# Patient Record
Sex: Male | Born: 1935 | Race: Asian | Hispanic: No | Marital: Married | Smoking: Never smoker
Health system: Southern US, Community
[De-identification: ages and names within clinical notes are randomized; demographics above are authoritative.]

## PROBLEM LIST (undated history)

## (undated) DIAGNOSIS — N201 Calculus of ureter: Secondary | ICD-10-CM

## (undated) DIAGNOSIS — I10 Essential (primary) hypertension: Secondary | ICD-10-CM

## (undated) DIAGNOSIS — N189 Chronic kidney disease, unspecified: Secondary | ICD-10-CM

## (undated) DIAGNOSIS — E785 Hyperlipidemia, unspecified: Secondary | ICD-10-CM

## (undated) HISTORY — PX: CATARACT EXTRACTION W/ INTRAOCULAR LENS  IMPLANT, BILATERAL: SHX1307

---

## 2002-07-31 ENCOUNTER — Emergency Department (HOSPITAL_COMMUNITY): Admission: EM | Admit: 2002-07-31 | Discharge: 2002-07-31 | Payer: Self-pay | Admitting: Emergency Medicine

## 2002-12-13 ENCOUNTER — Emergency Department (HOSPITAL_COMMUNITY): Admission: EM | Admit: 2002-12-13 | Discharge: 2002-12-13 | Payer: Self-pay | Admitting: Emergency Medicine

## 2002-12-19 ENCOUNTER — Emergency Department (HOSPITAL_COMMUNITY): Admission: EM | Admit: 2002-12-19 | Discharge: 2002-12-19 | Payer: Self-pay | Admitting: Emergency Medicine

## 2011-01-02 ENCOUNTER — Ambulatory Visit (HOSPITAL_BASED_OUTPATIENT_CLINIC_OR_DEPARTMENT_OTHER)
Admission: RE | Admit: 2011-01-02 | Discharge: 2011-01-02 | Disposition: A | Discharge status: Home / Self Care | Payer: Medicare Other | Source: Ambulatory Visit | Attending: Urology | Admitting: Urology

## 2011-01-02 ENCOUNTER — Ambulatory Visit (HOSPITAL_COMMUNITY): Payer: PRIVATE HEALTH INSURANCE | Attending: Urology

## 2011-01-02 DIAGNOSIS — N1330 Unspecified hydronephrosis: Secondary | ICD-10-CM | POA: Insufficient documentation

## 2011-01-02 DIAGNOSIS — N201 Calculus of ureter: Secondary | ICD-10-CM | POA: Insufficient documentation

## 2011-01-02 DIAGNOSIS — I10 Essential (primary) hypertension: Secondary | ICD-10-CM | POA: Insufficient documentation

## 2011-01-02 HISTORY — PX: CYSTOSCOPY W/ URETERAL STENT PLACEMENT: SHX1429

## 2011-01-03 LAB — POCT I-STAT 4, (NA,K, GLUC, HGB,HCT)
Glucose, Bld: 120 mg/dL — ABNORMAL HIGH (ref 70–99)
HCT: 42 % (ref 39.0–52.0)

## 2011-01-04 ENCOUNTER — Ambulatory Visit (HOSPITAL_COMMUNITY)
Admission: RE | Admit: 2011-01-04 | Discharge: 2011-01-04 | Disposition: A | Discharge status: Home / Self Care | Payer: Medicare Other | Source: Ambulatory Visit | Attending: Urology | Admitting: Urology

## 2011-01-04 DIAGNOSIS — N200 Calculus of kidney: Secondary | ICD-10-CM | POA: Insufficient documentation

## 2011-01-04 DIAGNOSIS — I10 Essential (primary) hypertension: Secondary | ICD-10-CM | POA: Insufficient documentation

## 2011-01-04 DIAGNOSIS — N201 Calculus of ureter: Secondary | ICD-10-CM | POA: Insufficient documentation

## 2011-01-04 LAB — POCT I-STAT 4, (NA,K, GLUC, HGB,HCT)
HCT: 44 % (ref 39.0–52.0)
Hemoglobin: 15 g/dL (ref 13.0–17.0)
Potassium: 7.2 mEq/L (ref 3.5–5.1)
Sodium: 137 mEq/L (ref 135–145)
Sodium: 138 mEq/L (ref 135–145)

## 2011-01-10 NOTE — Op Note (Signed)
  NAMEMILBURN, FREENEY             ACCOUNT NO.:  [id number]  MEDICAL RECORD NO.:  [id number]           PATIENT TYPE:  O  LOCATION:  DAY                          FACILITY:  [facility]  PHYSICIAN:  Courtney Paris, M.D.DATE OF BIRTH:  1935/04/12  DATE OF PROCEDURE:  01/02/2011 DATE OF DISCHARGE:                              OPERATIVE REPORT   PREOPERATIVE DIAGNOSIS:  Obstructing right mid ureteral calculus, 9 x 18 mm.  POSTOPERATIVE DIAGNOSIS:  Obstructing right mid ureteral calculus, 9 x 18 mm.  OPERATION:  Cystoscopy, right retrograde pyelogram, right placement of ureteral stent.  ANESTHESIA:  General.  SURGEON:  Courtney Paris, MD  BRIEF HISTORY:  This 76 year old [place] gentleman presented with some hematuria for workup, was found to have a 9 x 18 mm right mid ureteral stone with moderate hydronephrosis.  We do not know how long he has had the stent as he has not had pain.  He has not had previous stones before.  He is admitted now for cystoscopy and placement of a right ureteral stent prior to attempted lithotripsy of this stone later this week.  The patient was placed on the operating table in dorsal lithotomy position.  After satisfactory induction of general anesthesia, he was prepped and draped with Betadine in the usual sterile fashion and given IV Cipro.  Time-out was then performed and the patient and procedure then reconfirmed.  Cystoscopy was done, anterior urethra was normal, no significant prostate enlargement was noted, and the bladder was entered. Both orifices looked normal.  The bladder itself was free of any mucosal lesions.  The right orifice was catheterized with a 5 open-ended ureteral catheter.  It was quite tortuous and up to the level of the stone and there was moderate hydronephrosis proximal to the stone. Through the 5 open-ended ureteral catheter under fluoroscopy, I passed a glidewire under direct vision and then passed the stone.  I  had difficulty getting the open-ended ureteral catheter go past the stone. I left the glidewire in place and passed a 6-French x 20 cm length double-J ureteral stent up to the level of stone and with gentle manipulation, I was finally able to get the stent passed this into the kidney.  The glidewire was then removed and a nice coil in the renal bladder and 1 in the renal pelvis was noted.  The bladder was drained, given B and O suppository, he was taken to recovery room in good condition.  He has planned to have his lithotripsy in 2 days.     Courtney Paris, M.D.     HMK/MEDQ  D:  01/02/2011  T:  01/02/2011  Job:  [id number]  Electronically Signed by Vic Blackbird M.D. on 01/10/2011 07:58:03 AM

## 2011-02-05 ENCOUNTER — Ambulatory Visit (HOSPITAL_COMMUNITY)
Admission: RE | Admit: 2011-02-05 | Discharge: 2011-02-05 | Disposition: A | Discharge status: Home / Self Care | Payer: Medicare Other | Source: Ambulatory Visit | Attending: Urology | Admitting: Urology

## 2011-02-05 ENCOUNTER — Ambulatory Visit (HOSPITAL_COMMUNITY): Payer: Medicare Other

## 2011-02-05 DIAGNOSIS — I10 Essential (primary) hypertension: Secondary | ICD-10-CM | POA: Insufficient documentation

## 2011-02-05 DIAGNOSIS — N201 Calculus of ureter: Secondary | ICD-10-CM | POA: Insufficient documentation

## 2011-02-05 HISTORY — PX: EXTRACORPOREAL SHOCK WAVE LITHOTRIPSY: SHX1557

## 2011-02-05 LAB — BASIC METABOLIC PANEL
Calcium: 9.2 mg/dL (ref 8.4–10.5)
Chloride: 104 mEq/L (ref 96–112)
Creatinine, Ser: 0.83 mg/dL (ref 0.4–1.5)
GFR calc Af Amer: >60 mL/min (ref >60)
GFR calc non Af Amer: >60 mL/min (ref >60)

## 2011-02-05 LAB — CBC
MCH: 31.2 pg (ref 26.0–34.0)
Platelets: 188 10*3/uL (ref 150–400)
RBC: 4.49 MIL/uL (ref 4.22–5.81)

## 2011-07-31 IMAGING — CR DG ABDOMEN 1V
1 series · 1 of 1 positions shown · non-contrast
Comparison: KUB 01/04/2011.

CLINICAL DATA: Pre lithotripsy examination.

ABDOMEN - 1 VIEW

[t abdomen supine]
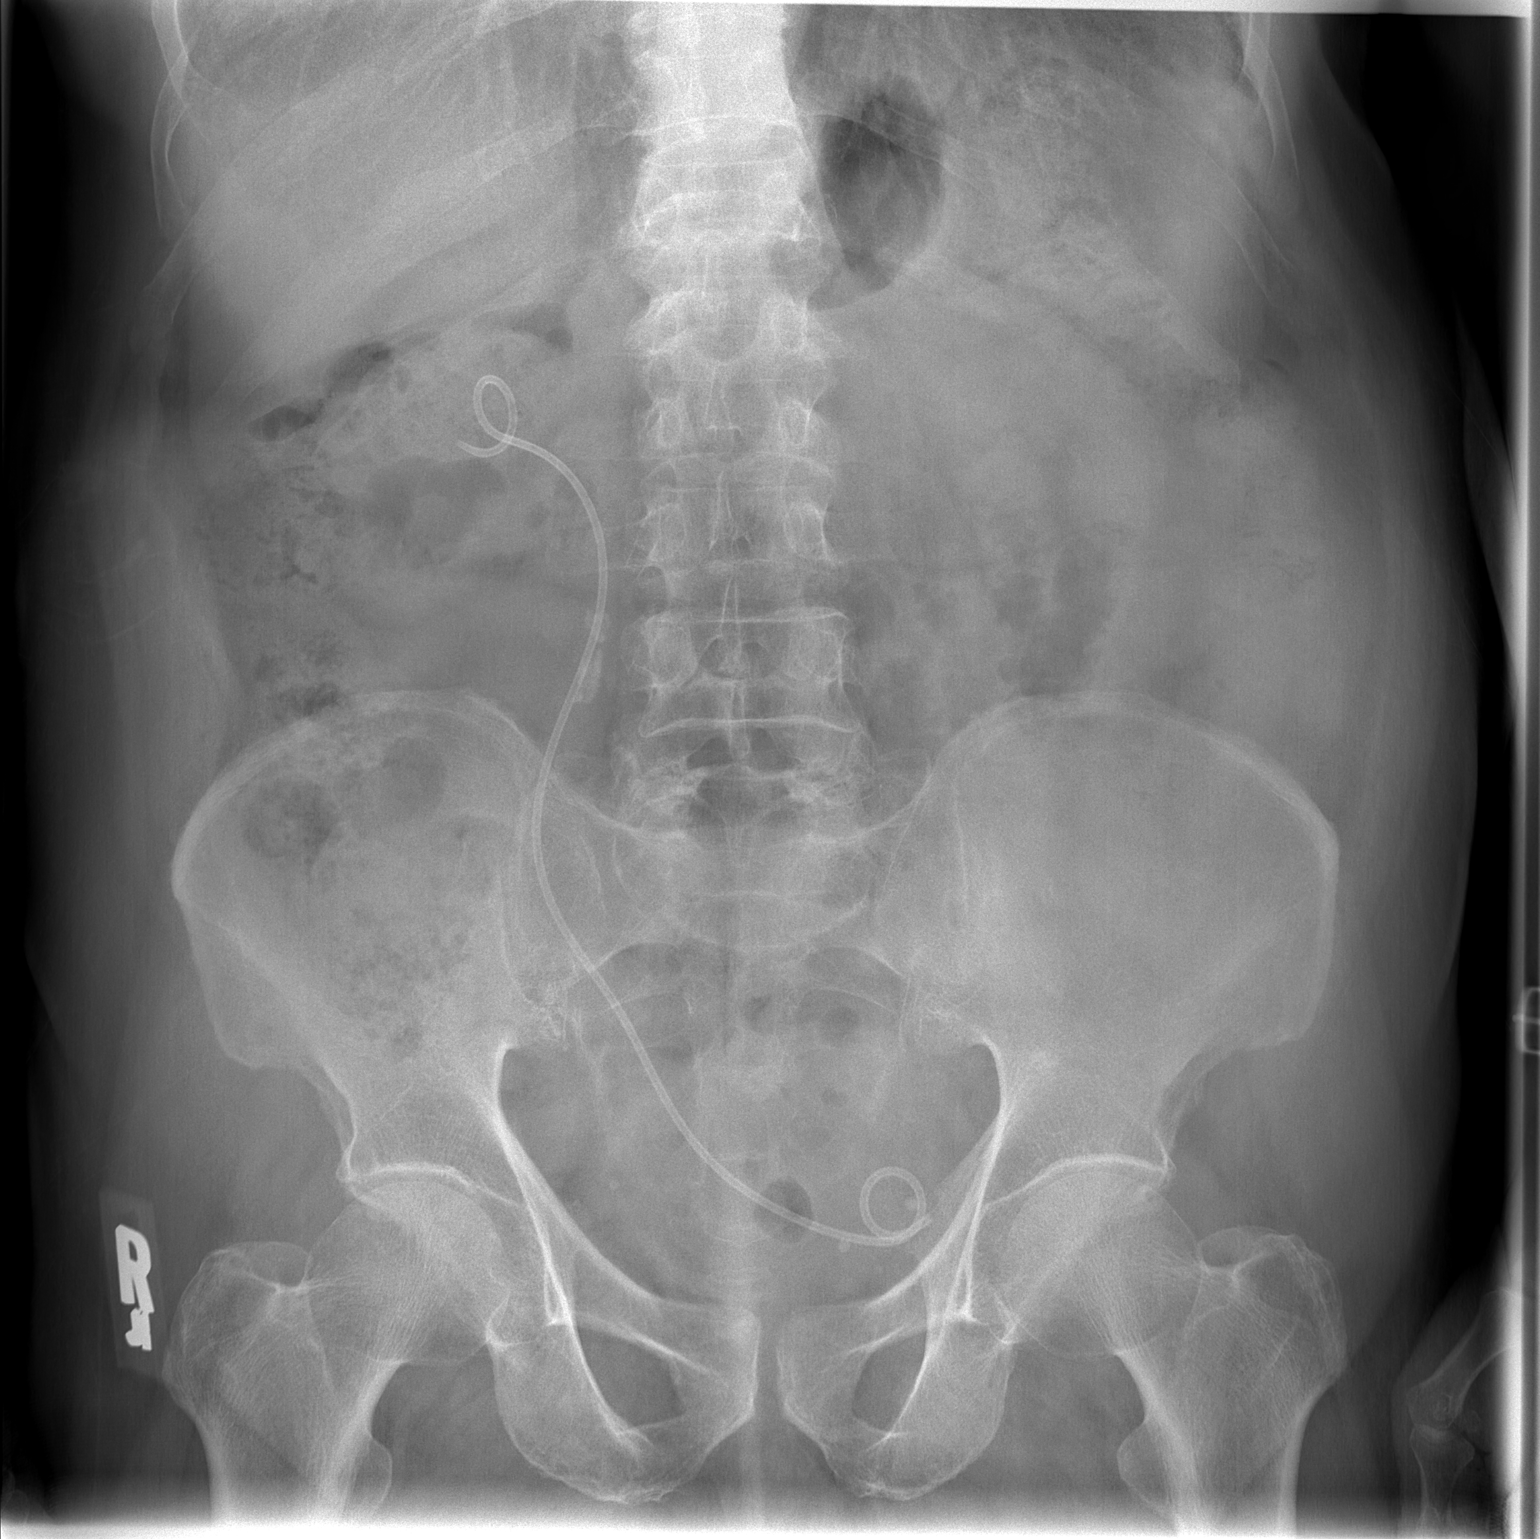

[1 of 1 positions shown; findings below may reference images not displayed]

FINDINGS: Right double-J ureteral stent remains in place.  The
large right ureteral stone seen on the prior study and appears
fragmented into two separate stones.  Both fragments are the level
of the right L4 transverse process.  The more cephalad measures
cm cranial-caudal and the more inferior measures 1.5 cm cranial-
caudal.  No other urinary tract stones are identified with pelvic
phleboliths noted.
IMPRESSION: Right ureteral stone at the level of the L4 transverse process
appears fragmented due to stones on the current study.  No other
change.

## 2011-09-12 ENCOUNTER — Encounter (HOSPITAL_BASED_OUTPATIENT_CLINIC_OR_DEPARTMENT_OTHER): Payer: Self-pay | Admitting: *Deleted

## 2011-09-12 NOTE — Progress Notes (Signed)
NPO after MN with exception water until 0800. Pt to arrive at 1300. Needs istat.  CXR  And EKG with chart.  Pt friend to be [employer].

## 2011-09-12 NOTE — Pre-Procedure Instructions (Signed)
Spoke w/ pt friend arica talati. NPO after MN with exception water until 0800. Pt to arrive at 1300. Needs istat. cxr and ekg w/ chart. Will take norvasc am of surg. W/ sip of water.

## 2011-09-17 ENCOUNTER — Encounter (HOSPITAL_BASED_OUTPATIENT_CLINIC_OR_DEPARTMENT_OTHER): Payer: Self-pay | Admitting: Anesthesiology

## 2011-09-17 ENCOUNTER — Ambulatory Visit (HOSPITAL_BASED_OUTPATIENT_CLINIC_OR_DEPARTMENT_OTHER)
Admission: RE | Admit: 2011-09-17 | Discharge: 2011-09-17 | Disposition: A | Discharge status: Home / Self Care | Payer: Medicare Other | Source: Ambulatory Visit | Attending: Urology | Admitting: Urology

## 2011-09-17 ENCOUNTER — Ambulatory Visit (HOSPITAL_BASED_OUTPATIENT_CLINIC_OR_DEPARTMENT_OTHER): Payer: Medicare Other | Admitting: Anesthesiology

## 2011-09-17 ENCOUNTER — Encounter (HOSPITAL_BASED_OUTPATIENT_CLINIC_OR_DEPARTMENT_OTHER)
Admission: RE | Disposition: A | Discharge status: Home / Self Care | Payer: Self-pay | Source: Ambulatory Visit | Attending: Urology

## 2011-09-17 ENCOUNTER — Encounter (HOSPITAL_BASED_OUTPATIENT_CLINIC_OR_DEPARTMENT_OTHER): Payer: Self-pay | Admitting: *Deleted

## 2011-09-17 DIAGNOSIS — E7800 Pure hypercholesterolemia, unspecified: Secondary | ICD-10-CM | POA: Insufficient documentation

## 2011-09-17 DIAGNOSIS — I10 Essential (primary) hypertension: Secondary | ICD-10-CM | POA: Insufficient documentation

## 2011-09-17 DIAGNOSIS — N201 Calculus of ureter: Secondary | ICD-10-CM | POA: Insufficient documentation

## 2011-09-17 DIAGNOSIS — Z79899 Other long term (current) drug therapy: Secondary | ICD-10-CM | POA: Insufficient documentation

## 2011-09-17 DIAGNOSIS — Z01812 Encounter for preprocedural laboratory examination: Secondary | ICD-10-CM | POA: Insufficient documentation

## 2011-09-17 HISTORY — DX: Essential (primary) hypertension: I10

## 2011-09-17 HISTORY — PX: CYSTOSCOPY WITH URETEROSCOPY: SHX5123

## 2011-09-17 HISTORY — DX: Hyperlipidemia, unspecified: E78.5

## 2011-09-17 HISTORY — DX: Calculus of ureter: N20.1

## 2011-09-17 SURGERY — CYSTOSCOPY WITH URETEROSCOPY
Anesthesia: General | Site: Ureter | Laterality: Right | Wound class: Clean Contaminated

## 2011-09-17 MED ORDER — ASPIRIN 81 MG PO TABS: 81.0000 mg | ORAL_TABLET | Freq: Every day | ORAL | Status: DC

## 2011-09-17 MED ORDER — PROMETHAZINE HCL 25 MG/ML IJ SOLN: 6.2500 mg | INTRAMUSCULAR | Status: DC | PRN

## 2011-09-17 MED ORDER — ROCURONIUM BROMIDE 100 MG/10ML IV SOLN
INTRAVENOUS | Status: DC | PRN
  Administered 2011-09-17: 30 mg via INTRAVENOUS

## 2011-09-17 MED ORDER — SODIUM CHLORIDE 0.9 % IR SOLN
Status: DC | PRN
  Administered 2011-09-17: 3000 mL

## 2011-09-17 MED ORDER — HYOSCYAMINE SULFATE 0.125 MG PO TABS: 0.1250 mg | ORAL_TABLET | ORAL | Status: AC | PRN

## 2011-09-17 MED ORDER — OXYBUTYNIN CHLORIDE 5 MG PO TABS: 5.0000 mg | ORAL_TABLET | Freq: Four times a day (QID) | ORAL | Status: DC | PRN

## 2011-09-17 MED ORDER — PROPOFOL 10 MG/ML IV EMUL
INTRAVENOUS | Status: DC | PRN
  Administered 2011-09-17: 150 mg via INTRAVENOUS

## 2011-09-17 MED ORDER — ONDANSETRON HCL 4 MG/2ML IJ SOLN
INTRAMUSCULAR | Status: DC | PRN
  Administered 2011-09-17: 4 mg via INTRAVENOUS

## 2011-09-17 MED ORDER — DEXAMETHASONE SODIUM PHOSPHATE 4 MG/ML IJ SOLN
INTRAMUSCULAR | Status: DC | PRN
  Administered 2011-09-17: 4 mg via INTRAVENOUS

## 2011-09-17 MED ORDER — LACTATED RINGERS IV SOLN
INTRAVENOUS | Status: DC | PRN
  Administered 2011-09-17 (×2): via INTRAVENOUS

## 2011-09-17 MED ORDER — FENTANYL CITRATE 0.05 MG/ML IJ SOLN
INTRAMUSCULAR | Status: DC | PRN
  Administered 2011-09-17 (×2): 50 ug via INTRAVENOUS

## 2011-09-17 MED ORDER — CEFAZOLIN SODIUM 1-5 GM-% IV SOLN: 1.0000 g | INTRAVENOUS | Status: DC

## 2011-09-17 MED ORDER — LIDOCAINE HCL 4 % IJ SOLN
INTRAMUSCULAR | Status: DC | PRN
  Administered 2011-09-17: 4 mL

## 2011-09-17 MED ORDER — CEFAZOLIN SODIUM 1-5 GM-% IV SOLN
INTRAVENOUS | Status: DC | PRN
  Administered 2011-09-17: 1 g via INTRAVENOUS

## 2011-09-17 MED ORDER — FENTANYL CITRATE 0.05 MG/ML IJ SOLN: 25.0000 ug | INTRAMUSCULAR | Status: DC | PRN

## 2011-09-17 MED ORDER — LACTATED RINGERS IV SOLN
INTRAVENOUS | Status: DC
  Administered 2011-09-17: 14:00:00 via INTRAVENOUS

## 2011-09-17 MED ORDER — BELLADONNA ALKALOIDS-OPIUM 16.2-60 MG RE SUPP
RECTAL | Status: DC | PRN
  Administered 2011-09-17: 1 via RECTAL

## 2011-09-17 MED ORDER — HYDROCODONE-ACETAMINOPHEN 5-325 MG PO TABS: 1.0000 | ORAL_TABLET | ORAL | Status: DC | PRN

## 2011-09-17 MED ORDER — GLYCOPYRROLATE 0.2 MG/ML IJ SOLN
INTRAMUSCULAR | Status: DC | PRN
  Administered 2011-09-17: .6 mg via INTRAVENOUS

## 2011-09-17 MED ORDER — SUCCINYLCHOLINE CHLORIDE 20 MG/ML IJ SOLN
INTRAMUSCULAR | Status: DC | PRN
  Administered 2011-09-17: 80 mg via INTRAVENOUS

## 2011-09-17 MED ORDER — MEPERIDINE HCL 25 MG/ML IJ SOLN: 6.2500 mg | INTRAMUSCULAR | Status: DC | PRN

## 2011-09-17 MED ORDER — IOHEXOL 350 MG/ML SOLN
INTRAVENOUS | Status: DC | PRN
  Administered 2011-09-17: 50 mL

## 2011-09-17 MED ORDER — PHENAZOPYRIDINE HCL 100 MG PO TABS: 100.0000 mg | ORAL_TABLET | Freq: Three times a day (TID) | ORAL | Status: AC | PRN

## 2011-09-17 MED ORDER — NEOSTIGMINE METHYLSULFATE 1 MG/ML IJ SOLN
INTRAMUSCULAR | Status: DC | PRN
  Administered 2011-09-17: 5 mg via INTRAVENOUS

## 2011-09-17 MED ORDER — LIDOCAINE HCL (CARDIAC) 20 MG/ML IV SOLN
INTRAVENOUS | Status: DC | PRN
  Administered 2011-09-17: 60 mg via INTRAVENOUS

## 2011-09-17 SURGICAL SUPPLY — 35 items
ADAPTER CATH URET PLST 4-6FR (CATHETERS) ×2 IMPLANT
BAG DRAIN URO-CYSTO SKYTR STRL (DRAIN) ×2 IMPLANT
BASKET LASER NITINOL 1.9FR (BASKET) IMPLANT
BASKET STNLS GEMINI 4WIRE 3FR (BASKET) IMPLANT
BASKET ZERO TIP NITINOL 2.4FR (BASKET) ×2 IMPLANT
BRUSH URET BIOPSY 3F (UROLOGICAL SUPPLIES) IMPLANT
CANISTER SUCT LVC 12 LTR MEDI- (MISCELLANEOUS) ×2 IMPLANT
CATH INTERMIT  6FR 70CM (CATHETERS) ×2 IMPLANT
CATH URET 5FR 28IN CONE TIP (BALLOONS)
CATH URET 5FR 28IN OPEN ENDED (CATHETERS) IMPLANT
CATH URET 5FR 70CM CONE TIP (BALLOONS) IMPLANT
CLOTH BEACON ORANGE TIMEOUT ST (SAFETY) ×2 IMPLANT
DRAPE CAMERA CLOSED 9X96 (DRAPES) ×2 IMPLANT
DRSG TEGADERM 2-3/8X2-3/4 SM (GAUZE/BANDAGES/DRESSINGS) ×2 IMPLANT
ELECT REM PT RETURN 9FT ADLT (ELECTROSURGICAL)
ELECTRODE REM PT RTRN 9FT ADLT (ELECTROSURGICAL) IMPLANT
GLOVE BIOGEL PI IND STRL 6.5 (GLOVE) ×2 IMPLANT
GLOVE BIOGEL PI INDICATOR 6.5 (GLOVE) ×2
GLOVE ECLIPSE 7.0 STRL STRAW (GLOVE) ×2 IMPLANT
GLOVE INDICATOR 7.5 STRL GRN (GLOVE) ×2 IMPLANT
GOWN BRE IMP SLV AUR LG STRL (GOWN DISPOSABLE) ×2 IMPLANT
GOWN PREVENTION PLUS LG XLONG (DISPOSABLE) ×2 IMPLANT
GUIDEWIRE 0.038 PTFE COATED (WIRE) IMPLANT
GUIDEWIRE ANG ZIPWIRE 038X150 (WIRE) IMPLANT
GUIDEWIRE STR DUAL SENSOR (WIRE) ×4 IMPLANT
KIT BALLIN UROMAX 15FX10 (LABEL) IMPLANT
KIT BALLN UROMAX 15FX4 (MISCELLANEOUS) IMPLANT
KIT BALLN UROMAX 26 75X4 (MISCELLANEOUS)
LASER FIBER DISP (UROLOGICAL SUPPLIES) ×2 IMPLANT
PACK CYSTOSCOPY (CUSTOM PROCEDURE TRAY) ×2 IMPLANT
SET HIGH PRES BAL DIL (LABEL)
SHEATH URET ACCESS 12FR/35CM (UROLOGICAL SUPPLIES) IMPLANT
SHEATH URET ACCESS 12FR/55CM (UROLOGICAL SUPPLIES) IMPLANT
STENT URET 6FRX24 CONTOUR (STENTS) ×2 IMPLANT
SYRINGE IRR TOOMEY STRL 70CC (SYRINGE) ×2 IMPLANT

## 2011-09-17 NOTE — H&P (Signed)
Chief Complaint  Right ureter stone   History of Present Illness        Previous visit:      76 year old male previously seen Dr. Aldean Ast for urolithiasis.  He presented with frequency and microscopic hematuria and during work-up was found to have a 9 x 18 mm stone in his right midureter.  He had right SWL with a stent in place 01/04/11 with repeat SWL on 02/05/11 for residual stone.  His stent was removed 03/05/11.  He had a KUB in May 2012 which showed residual stone fragment in the right ureter and hydronephrosis on renal US.  Today, I recommend obtaining a CT scan to evaluate his stone burden.  He has no flank pain.  He was asymptomatic previously when his obstructing stone was found.  He has not had any gross hematuria or dysuria.  He has urinary frequency.  It has been present for 6 months. It is not associated with gross hematuria or dysuria. Nothing makes it better. Nothing makes it worse. It occurs only during the daytime when he is at work, and he does not experience it went home. He drinks coffee and the before and during work. He also has urinary urgency. His ipss score is 9 with a quality of life score of 2.  Today we discussed prostate cancer screening. He is been having his PSA drawn on a yearly basis at a community screening but he does not know his values. He missed the screening this year. Today we discussed the risk and benefits of screening and he wishes to proceed.  He has bilateral urolithiasis on his CT scan.  He has 2 stones in his left mid pole kidney one measuring 6 mm the other measuring 5 mm. He has a very large obstructing right ureteral stone which measures 7.5 x 6.2 x 16.1 mm. He has associated right-sided hydronephrosis and enlarged ureter all the way down to the bladder.  Today we discussed the management of urinary stones. These options include observation, ureteroscopy, shockwave lithotripsy, and PCNL. We discussed which options are relevant to these particular  stones. We discussed the natural history of stones as well as the complications of untreated stones and the impact on quality of life without treatment as well as with each of the above listed treatments. We also discussed the efficacy of each treatment in its ability to clear the stone burden. With any of these management options I discussed the signs and symptoms of infection and the need for emergent treatment should these be experienced. For each option we discussed the ability of each procedure to clear the patient of their stone burden.  For observation I described the risks which include but are not limited to silent renal damage, life-threatening infection, need for emergent surgery, failure to pass stone, and pain.  For ureteroscopy I described the risks which include heart attack, stroke, pulmonary embolus, death, bleeding, infection, damage to contiguous structures, positioning injury, ureteral stricture, ureteral avulsion, ureteral injury, need for ureteral stent, inability to perform ureteroscopy, need for an interval procedure, inability to clear stone burden, stent discomfort and pain.  For shockwave lithotripsy I described the risks which include arrhythmia, kidney contusion, kidney hemorrhage, need for transfusion, long-term risk of diabetes or hypertension, back discomfort, flank ecchymosis, flank abrasion, inability to break up stone, inability to pass stone fragments, Steinstrasse, infection associated with obstructing stones, need for different surgical procedure, need for repeat shockwave lithotripsy, and death.   He wishes to have a  ureteroscopy.  We discussed the risks, benefits, alternatives, and likelihood of achieving his goals.  His pre-op urine culture was negative.   Past Medical History Problems  1. History of  Acute Cystitis 595.0 2. History of  Hypercholesterolemia 272.0 3. History of  Hypertension 401.9 4. History of  Microscopic Hematuria 599.72  Surgical  History Problems  1. History of  Cataract Surgery Bilateral 2. History of  Cystoscopy With Insertion Of Ureteral Stent Right 3. History of  Lithotripsy 4. History of  Lithotripsy  Current Meds 1. AmLODIPine Besylate 5 MG Oral Tablet; Therapy: (Recorded:06Jan2012) to 2. Aspirin 81 MG Oral Tablet; Therapy: (Recorded:04Oct2012) to 3. Levothyroxine Sodium 50 MCG Oral Tablet; Therapy: (Recorded:06Jan2012) to 4. Simvastatin 20 MG Oral Tablet; Therapy: (Recorded:06Jan2012) to  Allergies Medication  1. No Known Drug Allergies   Family History Problems  1. Paternal history of  Death In The Family Father 23 2. Maternal history of  Death In The Family Mother 66- accidental fall- brain hemorrhage 3. Family history of  Family Health Status Number Of Children 3 sons 4. Maternal history of  Intracranial Hemorrhage  Social History Problems  1. Caffeine Use 1 tea per day 2. Marital History - Currently Married 3. Never A Smoker 4. 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 History of  Alcohol Use  Review of Systems Constitutional, skin, eye, otolaryngeal, hematologic/lymphatic, cardiovascular, pulmonary, endocrine, musculoskeletal, gastrointestinal, neurological and psychiatric system(s) were reviewed and pertinent findings if present are noted.    Physical Exam Constitutional: Well nourished and well developed.  ENT:. The ears and nose are normal in appearance. The oropharynx is normal.  Neck: The appearance of the neck is normal and no neck mass is present.  Pulmonary: No respiratory distress and normal respiratory rhythm and effort.  Cardiovascular: Heart rate and rhythm are normal . No peripheral edema.  Abdomen: The abdomen is soft and nontender. No masses are palpated. The abdomen is no rebound. No CVA tenderness.  Lymphatics: The posterior cervical and supraclavicular nodes are not enlarged or tender.  Skin: Normal skin turgor and no visible rash.   Neuro/Psych:. Mood and affect are appropriate. No focal sensory deficits.     Assessment: Right ureter stone.  Plan:  Cystoscopy, right ureteroscopy, laser lithotripsy, right ureteral stent placement.

## 2011-09-17 NOTE — Transfer of Care (Signed)
Immediate Anesthesia Transfer of Care Note  Patient: [name]  Procedure(s) Performed:  CYSTOSCOPY WITH URETEROSCOPY - CYSTOSCOPY, RIGHT URETEROSCOPY WITH LASER LITHO AND STENT   Patient Location: PACU  Anesthesia Type: General  Level of Consciousness: awake, alert  and sedated  Airway & Oxygen Therapy: Patient Spontanous Breathing and Patient connected to face mask oxygen  Post-op Assessment: Report given to PACU RN and Post -op Vital signs reviewed and stable  Post vital signs: Reviewed and stable  Complications: No apparent anesthesia complications

## 2011-09-17 NOTE — Brief Op Note (Signed)
09/17/2011  2:22 PM  PATIENT:  Christohper Niziolek  76 y.o. male  PRE-OPERATIVE DIAGNOSIS:  RIGHT URETERAL STONE   POST-OPERATIVE DIAGNOSIS:  Right ureteral stone  PROCEDURE:  Procedure(s): CYSTOSCOPY WITH URETEROSCOPY, laser lithotripsy, basket stone retrieval, right ureteral stent placement.  SURGEON:  Surgeon(s): Milford Cage, MD  PHYSICIAN ASSISTANT: None  ASSISTANTS: none   ANESTHESIA:   general  EBL:   Minimal  BLOOD ADMINISTERED:none  DRAINS: none   LOCAL MEDICATIONS USED:  NONE  SPECIMEN:  Stone given to family.  DISPOSITION OF SPECIMEN:  N/A  COUNTS:  YES  TOURNIQUET:  * No tourniquets in log *  DICTATION: .Other Dictation: Dictation Number [id number]  PLAN OF CARE: Discharge to home after PACU  PATIENT DISPOSITION:  PACU - hemodynamically stable.   Delay start of Pharmacological VTE agent (>24hrs) due to surgical blood loss or risk of bleeding:  yes

## 2011-09-17 NOTE — Anesthesia Procedure Notes (Signed)
Procedure Name: Intubation Date/Time: 09/17/2011 2:44 PM Performed by: Huel Coventry Pre-anesthesia Checklist: Patient identified, Suction available, Emergency Drugs available, Patient being monitored and Timeout performed Patient Re-evaluated:Patient Re-evaluated prior to inductionOxygen Delivery Method: Circle System Utilized Preoxygenation: Pre-oxygenation with 100% oxygen Intubation Type: IV induction Ventilation: Mask ventilation without difficulty Laryngoscope Size: Mac and 3 Grade View: Grade I Tube type: Oral Tube size: 7.0 mm Number of attempts: 1 Airway Equipment and Method: oral airway and stylet Placement Confirmation: ETT inserted through vocal cords under direct vision,  positive ETCO2 and breath sounds checked- equal and bilateral Secured at: 22 cm Tube secured with: Tape Dental Injury: Teeth and Oropharynx as per pre-operative assessment

## 2011-09-17 NOTE — Anesthesia Preprocedure Evaluation (Addendum)
Anesthesia Evaluation  Patient identified by MRN, date of birth, ID band Patient awake    Reviewed: Allergy & Precautions, H&P , NPO status , Patient's Chart, lab work & pertinent test results, reviewed documented beta blocker date and time   Airway Mallampati: II TM Distance: >3 FB Neck ROM: full    Dental No notable dental hx.    Pulmonary neg pulmonary ROS,  clear to auscultation  Pulmonary exam normal       Cardiovascular Exercise Tolerance: Good hypertension, neg cardio ROS regular Normal    Neuro/Psych Negative Neurological ROS  Negative Psych ROS   GI/Hepatic negative GI ROS, Neg liver ROS,   Endo/Other  Negative Endocrine ROS  Renal/GU negative Renal ROS  Genitourinary negative   Musculoskeletal   Abdominal   Peds  Hematology negative hematology ROS (+)   Anesthesia Other Findings   Reproductive/Obstetrics negative OB ROS                           Anesthesia Physical Anesthesia Plan  ASA: II  Anesthesia Plan: General   Post-op Pain Management:    Induction: Intravenous  Airway Management Planned: LMA  Additional Equipment:   Intra-op Plan:   Post-operative Plan:   Informed Consent: I have reviewed the patients History and Physical, chart, labs and discussed the procedure including the risks, benefits and alternatives for the proposed anesthesia with the patient or authorized representative who has indicated his/her understanding and acceptance.   Dental Advisory Given and Dental advisory given  Plan Discussed with: CRNA and Surgeon  Anesthesia Plan Comments:         Anesthesia Quick Evaluation

## 2011-09-18 LAB — POCT I-STAT 4, (NA,K, GLUC, HGB,HCT)
HCT: 43 % (ref 39.0–52.0)
Hemoglobin: 14.6 g/dL (ref 13.0–17.0)
Sodium: 140 mEq/L (ref 135–145)

## 2011-09-18 NOTE — Op Note (Signed)
NAME:  [name], [name] NO.:  [id number]  MEDICAL RECORD NO.:  [id number]  LOCATION:                                 FACILITY:  PHYSICIAN:  Natalia Leatherwood, MD    DATE OF BIRTH:  [birth year]  DATE OF PROCEDURE:  09/17/2011 DATE OF DISCHARGE:                              OPERATIVE REPORT   SURGEON:  Natalia Leatherwood, MD  ASSISTANT:  None.  PREOPERATIVE DIAGNOSIS:  Right ureteral stone.  POSTOPERATIVE DIAGNOSIS:  Right ureteral stone.  PROCEDURES PERFORMED: 1. Cystoscopy. 2. Right ureteroscopy. 3. Laser lithotripsy. 4. Basket stone retrieval. 5. Right ureteral stent placement. 6. Fluoroscopy.  SPECIMENS:  Stone fragments given to this patient's family.  COMPLICATIONS:  None.  ESTIMATED BLOOD LOSS:  Minimal.  FINDINGS:  Large right mid ureteral stone.  HISTORY OF PRESENT ILLNESS:  This is a 76 year old male who has a history of right ureteral stone.  It was resistant shock wave lithotripsy.  The patient was seen in my clinic and counseled regarding treatment options and he elected to have her ureteroscopy.  He presents for that procedure today.  PROCEDURE:  After informed consent was obtained, the patient was taken to operating room, where he was placed in supine position.  IV antibiotics were infused and general anesthesia is induced.  Following this, a time-out was performed in which the correct patient, surgical site, and procedure were all identified and agreed upon by the team. Next, he was placed in a dorsal lithotomy position where his genitals were prepped and draped in usual sterile fashion.  A 12-degree rigid cystoscope was advanced through the urethra into the bladder.  The right ureter was immediately identified.  Two sensor tip wires were placed up the right ureter with good curl on fluoroscopy in the right renal pelvis.  Following this, the bladder was drained and then a semi-rigid ureteroscope was placed through the urethra and up  into the ureter with ease.  Stone was encountered and then a 200 micron holmium laser fiber was placed through the semi-rigid ureteroscope and lithotripsy was carried out at a setting of 0.5 joules and 20 Hz.  After lithotripsy had been carried out completely, it was noted that the stone fragments were mostly smaller than the Sensor wires themselves.  A few small fragments were grasped and removed and placed into the bladder and these were irrigated out and a few of the samples were given to the family.  After this was complete, the ureter was wide open.  One of the wires was removed leaving 1 wire behind.  The ureteroscope was removed, and then a 6 x 24 double-J ureteral stent was placed up over the wire with good curl noted in the right renal pelvis on fluoroscopy.  The string was left on the stent.  The bladder was emptied and B and O suppository was placed in the patient's rectum after the strings were secured to his penis with Tegaderm.  The patient will remove the stent himself on September 20, 2011.  This completed the procedure.  The patient was placed back in supine position.  Anesthesia was reversed and taken to PACU in stable condition.  He was given Vicodin for  pain medication, hyoscyamine and oxybutynin for bladder spasms, and Pyridium for dysuria.  The patient will follow up in my clinic in 3 or 4 weeks.          ______________________________ Natalia Leatherwood, MD     DW/MEDQ  D:  09/17/2011  T:  09/17/2011  Job:  [id number]

## 2011-09-18 NOTE — Anesthesia Postprocedure Evaluation (Signed)
  Anesthesia Post-op Note  Patient: [name]  Procedure(s) Performed:  CYSTOSCOPY WITH URETEROSCOPY - CYSTOSCOPY, RIGHT URETEROSCOPY WITH LASER LITHO AND STENT   Patient Location: PACU  Anesthesia Type: General  Level of Consciousness: awake and alert   Airway and Oxygen Therapy: Patient Spontanous Breathing  Post-op Pain: mild  Post-op Assessment: Post-op Vital signs reviewed, Patient's Cardiovascular Status Stable, Respiratory Function Stable, Patent Airway and No signs of Nausea or vomiting  Post-op Vital Signs: stable  Complications: No apparent anesthesia complications

## 2011-09-21 ENCOUNTER — Encounter (HOSPITAL_BASED_OUTPATIENT_CLINIC_OR_DEPARTMENT_OTHER): Payer: Self-pay | Admitting: Urology

## 2011-11-02 ENCOUNTER — Other Ambulatory Visit: Payer: Self-pay | Admitting: Urology

## 2011-11-08 ENCOUNTER — Encounter (HOSPITAL_COMMUNITY): Payer: Self-pay | Admitting: Pharmacy Technician

## 2011-11-08 ENCOUNTER — Encounter (HOSPITAL_COMMUNITY): Payer: Self-pay | Admitting: *Deleted

## 2011-11-08 NOTE — Progress Notes (Signed)
Called pt back, reviewed instructions to bring blue folder, fiill paperwork out, no aspirin, bld thinners, & vitamins 3days prior to procedure. Limited english but pt was able to verbalized understanding. Scheduled for interpreter on day of procedure.

## 2011-11-08 NOTE — Progress Notes (Signed)
Called pt for health hx, pt difficult to understand, will need interpreter. Pt will call back today.  Called & left message at [facility] with [facility] re: interpreter needs.

## 2011-11-11 MED ORDER — CIPROFLOXACIN HCL 500 MG PO TABS: 500.0000 mg | ORAL_TABLET | ORAL | Status: DC

## 2011-11-12 ENCOUNTER — Encounter (HOSPITAL_COMMUNITY): Payer: Self-pay | Admitting: *Deleted

## 2011-11-12 ENCOUNTER — Encounter (HOSPITAL_COMMUNITY)
Admission: RE | Disposition: A | Discharge status: Home / Self Care | Payer: Self-pay | Source: Ambulatory Visit | Attending: Urology

## 2011-11-12 ENCOUNTER — Ambulatory Visit (HOSPITAL_COMMUNITY)
Admission: RE | Admit: 2011-11-12 | Discharge: 2011-11-12 | Disposition: A | Discharge status: Home / Self Care | Payer: Medicare Other | Source: Ambulatory Visit | Attending: Urology | Admitting: Urology

## 2011-11-12 ENCOUNTER — Ambulatory Visit (HOSPITAL_COMMUNITY): Payer: Medicare Other

## 2011-11-12 DIAGNOSIS — N200 Calculus of kidney: Secondary | ICD-10-CM | POA: Insufficient documentation

## 2011-11-12 DIAGNOSIS — Z5309 Procedure and treatment not carried out because of other contraindication: Secondary | ICD-10-CM | POA: Insufficient documentation

## 2011-11-12 DIAGNOSIS — Z7982 Long term (current) use of aspirin: Secondary | ICD-10-CM | POA: Insufficient documentation

## 2011-11-12 SURGERY — LITHOTRIPSY, ESWL
Anesthesia: LOCAL | Laterality: Left

## 2011-11-12 MED ORDER — DIAZEPAM 5 MG PO TABS: 10.0000 mg | ORAL_TABLET | ORAL | Status: DC

## 2011-11-12 MED ORDER — DIPHENHYDRAMINE HCL 25 MG PO CAPS: 25.0000 mg | ORAL_CAPSULE | ORAL | Status: DC

## 2011-11-12 MED ORDER — DEXTROSE-NACL 5-0.45 % IV SOLN: INTRAVENOUS | Status: DC

## 2011-11-12 NOTE — Progress Notes (Signed)
GU  Patient took his aspirin 81 mg within 72 hours.  According to [facility] protocol his left SWL will be canceled.  I will reschedule him as soon as possible.  I explained this to the patient today.  He voiced understanding.

## 2011-11-14 ENCOUNTER — Other Ambulatory Visit: Payer: Self-pay | Admitting: Urology

## 2011-12-03 ENCOUNTER — Encounter (HOSPITAL_COMMUNITY): Payer: Self-pay | Admitting: Pharmacist

## 2011-12-03 ENCOUNTER — Encounter (HOSPITAL_COMMUNITY): Payer: Self-pay | Admitting: *Deleted

## 2011-12-03 NOTE — Progress Notes (Signed)
Reminded to take laxative 1/27/13pm and no aspirin or vitamins after Thursday 12/06/11

## 2011-12-07 NOTE — H&P (Signed)
Chief Complaint  Left nephrolithiasis  History of Present Illness  This is a 77 year old male with history of bilateral urolithiasis. On 09/17/11 he had a right ureteroscopy with laser lithotripsy. He tolerated this procedure well. He returned to my office to discuss the remaining stone burden on his left side. This consists of two left mid-pole stines, 6 mm & 5 mm in size. We discussed treatment options in the office with their risks, benefits, alternatives, and likelihood of achieving his goals.  He presents today for left shock wave lithotripsy. He had a urine culture in the clinic on 11/02/11 which was negative for growth.  Past Medical History  Problems  1. History of Acute Cystitis 595.0  2. History of Hypercholesterolemia 272.0  3. History of Hypertension 401.9  4. History of Microscopic Hematuria 599.72  Surgical History  Problems  1. History of Cataract Surgery Bilateral  2. History of Cystoscopy With Insertion Of Ureteral Stent Right  3. History of Cystoscopy With Insertion Of Ureteral Stent Right  4. History of Cystoscopy With Ureteroscopy With Lithotripsy  5. History of Lithotripsy  6. History of Lithotripsy  Current Meds  1. AmLODIPine Besylate 5 MG Oral Tablet; Therapy: (Recorded:06Jan2012) to  2. Aspirin 81 MG Oral Tablet; Therapy: (Recorded:04Oct2012) to  3. Levothyroxine Sodium 50 MCG Oral Tablet; Therapy: (Recorded:06Jan2012) to  4. Nucynta 50 MG Oral Tablet; TAKE 1 TABLET Every 4 hours PRN for pain; Therapy: 26Nov2012 to  (Last Rx:26Nov2012)  5. Oxybutynin Chloride 5 MG Oral Tablet; Therapy: 05Nov2012 to  6. Simvastatin 20 MG Oral Tablet; Therapy: (Recorded:06Jan2012) to  7. Sulfamethoxazole-TMP DS 800-160 MG Oral Tablet; TAKE 1 TABLET TWICE DAILY WITH FOOD;  Therapy: 29Nov2012 to (Evaluate:13Dec2012) Requested for: 05Dec2012; Last Rx:03Dec2012  Allergies  Medication  1. No Known Drug Allergies  Family History  Problems  1. Paternal history of Death In The Family  Father  21  2. Maternal history of Death In The Family Mother  84- accidental fall- brain hemorrhage  3. Family history of Family Health Status Number Of Children  3 sons  4. Maternal history of Intracranial Hemorrhage  Social History  Problems  1. Caffeine Use  1 tea per day  2. Marital History - Currently Married  3. Never A Smoker  4. 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 History of Alcohol Use  Review of Systems  Constitutional, skin, eye, otolaryngeal, hematologic/lymphatic, cardiovascular, pulmonary, endocrine, musculoskeletal, gastrointestinal, neurological and psychiatric system(s) were reviewed and pertinent findings if present are noted.   Physical Exam: Filed Vitals:   12/10/11 0546  BP: 114/86  Pulse: 72  Temp: 97.3 F (36.3 C)  Resp: 20   Gen: NAD, AAO Head: [place], AT Neck: Supple, no mass Chest: CTA-B, no crackles CV: Regular rate, regular rhythm Abd: soft, NTTP Back: no right flank pain, no left flank pain Extremity: No BLE edema, no BUE edema Neuro: No tremor, no gross deficit  Assessment  Left Nephrolithiasis  Plan  Proceed with left sided shock wave lithotripsy.

## 2011-12-10 ENCOUNTER — Ambulatory Visit (HOSPITAL_COMMUNITY)
Admission: RE | Admit: 2011-12-10 | Discharge: 2011-12-10 | Disposition: A | Discharge status: Home / Self Care | Payer: Medicare Other | Source: Ambulatory Visit | Attending: Urology | Admitting: Urology

## 2011-12-10 ENCOUNTER — Ambulatory Visit (HOSPITAL_COMMUNITY): Payer: Medicare Other

## 2011-12-10 ENCOUNTER — Encounter (HOSPITAL_COMMUNITY): Payer: Self-pay | Admitting: *Deleted

## 2011-12-10 ENCOUNTER — Encounter (HOSPITAL_COMMUNITY)
Admission: RE | Disposition: A | Discharge status: Home / Self Care | Payer: Self-pay | Source: Ambulatory Visit | Attending: Urology

## 2011-12-10 DIAGNOSIS — Z7982 Long term (current) use of aspirin: Secondary | ICD-10-CM | POA: Insufficient documentation

## 2011-12-10 DIAGNOSIS — N200 Calculus of kidney: Secondary | ICD-10-CM

## 2011-12-10 DIAGNOSIS — R3129 Other microscopic hematuria: Secondary | ICD-10-CM | POA: Insufficient documentation

## 2011-12-10 DIAGNOSIS — I10 Essential (primary) hypertension: Secondary | ICD-10-CM | POA: Insufficient documentation

## 2011-12-10 DIAGNOSIS — Z79899 Other long term (current) drug therapy: Secondary | ICD-10-CM | POA: Insufficient documentation

## 2011-12-10 DIAGNOSIS — E7800 Pure hypercholesterolemia, unspecified: Secondary | ICD-10-CM | POA: Insufficient documentation

## 2011-12-10 HISTORY — DX: Chronic kidney disease, unspecified: N18.9

## 2011-12-10 SURGERY — LITHOTRIPSY, ESWL
Anesthesia: LOCAL | Laterality: Left

## 2011-12-10 MED ORDER — OXYCODONE-ACETAMINOPHEN 5-325 MG PO TABS: 1.0000 | ORAL_TABLET | ORAL | Status: AC | PRN

## 2011-12-10 MED ORDER — CIPROFLOXACIN HCL 500 MG PO TABS
500.0000 mg | ORAL_TABLET | ORAL | Status: AC
  Administered 2011-12-10: 500 mg via ORAL

## 2011-12-10 MED ORDER — DIPHENHYDRAMINE HCL 25 MG PO CAPS
25.0000 mg | ORAL_CAPSULE | ORAL | Status: AC
  Administered 2011-12-10: 25 mg via ORAL

## 2011-12-10 MED ORDER — DEXTROSE-NACL 5-0.45 % IV SOLN
INTRAVENOUS | Status: DC
  Administered 2011-12-10: 100 mL via INTRAVENOUS

## 2011-12-10 MED ORDER — DIAZEPAM 5 MG PO TABS
10.0000 mg | ORAL_TABLET | ORAL | Status: AC
  Administered 2011-12-10: 10 mg via ORAL

## 2011-12-10 MED ORDER — PHENAZOPYRIDINE HCL 100 MG PO TABS
100.0000 mg | ORAL_TABLET | Freq: Three times a day (TID) | ORAL | Status: DC | PRN
  Administered 2011-12-10: 100 mg via ORAL
  Filled 2011-12-10: qty 1

## 2011-12-10 NOTE — Brief Op Note (Signed)
12/10/2011  8:23 AM  PATIENT:  [name]  77 y.o. male  PRE-OPERATIVE DIAGNOSIS:  Left Nephrolithiasis  POST-OPERATIVE DIAGNOSIS: Left nephrolithiasis  PROCEDURE:  Procedure(s): Left EXTRACORPOREAL SHOCK WAVE LITHOTRIPSY (ESWL)  SURGEON:  Surgeon(s): Milford Cage, MD  PHYSICIAN ASSISTANT:   ASSISTANTS: none   ANESTHESIA:   IV sedation  EBL:   None  BLOOD ADMINISTERED:none  DRAINS: none   LOCAL MEDICATIONS USED:  NONE  SPECIMEN:  No Specimen  DISPOSITION OF SPECIMEN:  N/A  COUNTS:  YES  TOURNIQUET:  * No tourniquets in log *  DICTATION: .Note written in paper chart  PLAN OF CARE: Discharge to home after PACU  PATIENT DISPOSITION:  PACU - hemodynamically stable.   Delay start of Pharmacological VTE agent (>24hrs) due to surgical blood loss or risk of bleeding:  Yes

## 2012-02-28 DIAGNOSIS — I10 Essential (primary) hypertension: Secondary | ICD-10-CM | POA: Diagnosis present

## 2013-06-02 ENCOUNTER — Emergency Department (HOSPITAL_COMMUNITY)
Admission: EM | Admit: 2013-06-02 | Discharge: 2013-06-02 | Disposition: A | Discharge status: Nursing Home (Includes ICF) | Payer: Medicare Other | Source: Home / Self Care | Attending: Emergency Medicine | Admitting: Emergency Medicine

## 2013-06-02 ENCOUNTER — Emergency Department (HOSPITAL_COMMUNITY): Payer: Medicare Other

## 2013-06-02 ENCOUNTER — Encounter (HOSPITAL_COMMUNITY): Payer: Self-pay | Admitting: Emergency Medicine

## 2013-06-02 ENCOUNTER — Inpatient Hospital Stay (HOSPITAL_COMMUNITY)
Admission: EM | Admit: 2013-06-02 | Discharge: 2013-06-04 | DRG: 093 | Disposition: A | Discharge status: Home Health | Payer: Medicare Other | Attending: Internal Medicine | Admitting: Internal Medicine

## 2013-06-02 ENCOUNTER — Emergency Department (INDEPENDENT_AMBULATORY_CARE_PROVIDER_SITE_OTHER): Payer: Medicare Other

## 2013-06-02 DIAGNOSIS — Z7982 Long term (current) use of aspirin: Secondary | ICD-10-CM

## 2013-06-02 DIAGNOSIS — R296 Repeated falls: Secondary | ICD-10-CM

## 2013-06-02 DIAGNOSIS — M549 Dorsalgia, unspecified: Secondary | ICD-10-CM

## 2013-06-02 DIAGNOSIS — IMO0001 Reserved for inherently not codable concepts without codable children: Secondary | ICD-10-CM

## 2013-06-02 DIAGNOSIS — Z87442 Personal history of urinary calculi: Secondary | ICD-10-CM

## 2013-06-02 DIAGNOSIS — I10 Essential (primary) hypertension: Secondary | ICD-10-CM

## 2013-06-02 DIAGNOSIS — R270 Ataxia, unspecified: Secondary | ICD-10-CM | POA: Diagnosis present

## 2013-06-02 DIAGNOSIS — I129 Hypertensive chronic kidney disease with stage 1 through stage 4 chronic kidney disease, or unspecified chronic kidney disease: Secondary | ICD-10-CM | POA: Diagnosis present

## 2013-06-02 DIAGNOSIS — N189 Chronic kidney disease, unspecified: Secondary | ICD-10-CM | POA: Diagnosis present

## 2013-06-02 DIAGNOSIS — W19XXXA Unspecified fall, initial encounter: Secondary | ICD-10-CM

## 2013-06-02 DIAGNOSIS — R279 Unspecified lack of coordination: Principal | ICD-10-CM

## 2013-06-02 DIAGNOSIS — Y92009 Unspecified place in unspecified non-institutional (private) residence as the place of occurrence of the external cause: Secondary | ICD-10-CM

## 2013-06-02 DIAGNOSIS — E039 Hypothyroidism, unspecified: Secondary | ICD-10-CM | POA: Diagnosis present

## 2013-06-02 DIAGNOSIS — Z79899 Other long term (current) drug therapy: Secondary | ICD-10-CM

## 2013-06-02 DIAGNOSIS — E785 Hyperlipidemia, unspecified: Secondary | ICD-10-CM

## 2013-06-02 LAB — URINALYSIS, ROUTINE W REFLEX MICROSCOPIC
Bilirubin Urine: NEGATIVE
Hgb urine dipstick: NEGATIVE
Protein, ur: NEGATIVE mg/dL
Urobilinogen, UA: 0.2 mg/dL (ref 0.0–1.0)

## 2013-06-02 LAB — CBC WITH DIFFERENTIAL/PLATELET
Eosinophils Absolute: 0.1 10*3/uL (ref 0.0–0.7)
Eosinophils Relative: 1 % (ref 0–5)
Hemoglobin: 14.7 g/dL (ref 13.0–17.0)
Lymphs Abs: 2.5 10*3/uL (ref 0.7–4.0)
MCH: 32.3 pg (ref 26.0–34.0)
MCV: 91 fL (ref 78.0–100.0)
Monocytes Absolute: 0.6 10*3/uL (ref 0.1–1.0)
Monocytes Relative: 11 % (ref 3–12)
RBC: 4.55 MIL/uL (ref 4.22–5.81)

## 2013-06-02 LAB — BASIC METABOLIC PANEL
BUN: 10 mg/dL (ref 6–23)
Calcium: 9.6 mg/dL (ref 8.4–10.5)
GFR calc non Af Amer: 87 mL/min — ABNORMAL LOW (ref >90)
Glucose, Bld: 96 mg/dL (ref 70–99)

## 2013-06-02 MED ORDER — ACETAMINOPHEN 325 MG PO TABS
650.0000 mg | ORAL_TABLET | Freq: Once | ORAL | Status: AC
  Administered 2013-06-02: 650 mg via ORAL
  Filled 2013-06-02: qty 2

## 2013-06-02 MED ORDER — FENTANYL CITRATE 0.05 MG/ML IJ SOLN
50.0000 ug | Freq: Once | INTRAMUSCULAR | Status: AC
  Administered 2013-06-02: 50 ug via INTRAVENOUS
  Filled 2013-06-02: qty 2

## 2013-06-02 NOTE — ED Provider Notes (Signed)
Medical screening examination/treatment/procedure(s) were performed by non-physician practitioner and as supervising physician I was immediately available for consultation/collaboration.  Leslee Home, M.D.  Reuben Likes, MD 06/02/13 [phone]

## 2013-06-02 NOTE — ED Notes (Signed)
Admitting provider Dr. Nedra Hai at bedside.

## 2013-06-02 NOTE — ED Notes (Signed)
Via niece, interpreter, pt tripped and fell last week while at work on Saturday... sxs include lower back pain and falling more frequent... Denies head inj/LOC when he first fell... Also, right greater toe is locking up... He is alert w/no signs of acute distress.

## 2013-06-02 NOTE — ED Notes (Signed)
Pt sent from [facility] for multiple falls recently with lower back pain and issue with right toe; pt sent down for further eval

## 2013-06-02 NOTE — ED Provider Notes (Signed)
History    CSN: [id number] Arrival date & time 06/02/13  1621  First MD Initiated Contact with Patient 06/02/13 1702     Chief Complaint  Patient presents with  . Back Pain   (Consider location/radiation/quality/duration/timing/severity/associated sxs/prior Treatment) HPI Comments: 78 year old male presents complaining of lower back pain and difficulty walking since he fell on Saturday, 3 days ago. He fell from a standing height onto a carpeted floor. Since then, he has lower back pain on the right side and he is having difficulty with moving his right foot and this is causing him to occasionally stumble or trip. He feels that his toe is locked up. He denies any numbness in the foot, loss of bowel or bladder function, or saddle anesthesia. He denies any previous history of having any numbness or difficulty moving his foot, or any problems with ambulation  Patient is a 78 y.o. male presenting with back pain.  Back Pain Associated symptoms: no abdominal pain, no chest pain, no dysuria, no fever and no weakness    Past Medical History  Diagnosis Date  . Hypertension   . Hyperlipidemia   . Right ureteral stone   . Hematuria     ureteral stone and stent  . Chronic kidney disease     left nephrolithiasis   Past Surgical History  Procedure Laterality Date  . Cystoscopy w/ ureteral stent placement  01-02-11    right  . Extracorporeal shock wave lithotripsy  02-05-11    right  . Cataract extraction w/ intraocular lens  implant, bilateral    . Cystoscopy with ureteroscopy  09/17/2011    Procedure: CYSTOSCOPY WITH URETEROSCOPY;  Surgeon: Milford Cage, MD;  Location: [facility];  Service: Urology;  Laterality: Right;  CYSTOSCOPY, RIGHT URETEROSCOPY WITH LASER LITHO AND STENT    No family history on file. History  Substance Use Topics  . Smoking status: Never Smoker   . Smokeless tobacco: Never Used  . Alcohol Use: No    Review of Systems  Constitutional:  Negative for fever, chills and fatigue.  HENT: Negative for sore throat, neck pain and neck stiffness.   Eyes: Negative for visual disturbance.  Respiratory: Negative for cough and shortness of breath.   Cardiovascular: Negative for chest pain, palpitations and leg swelling.  Gastrointestinal: Negative for nausea, vomiting, abdominal pain, diarrhea and constipation.  Genitourinary: Negative for dysuria, urgency, frequency and hematuria.  Musculoskeletal: Positive for back pain. Negative for myalgias and arthralgias.       See HPI   Skin: Negative for rash.  Neurological: Negative for dizziness, weakness and light-headedness.    Allergies  Review of patient's allergies indicates no known allergies.  Home Medications   Current Outpatient Rx  Name  Route  Sig  Dispense  Refill  . amLODipine (NORVASC) 5 MG tablet   Oral   Take 5 mg by mouth every morning.           . simvastatin (ZOCOR) 20 MG tablet   Oral   Take 20 mg by mouth at bedtime.            BP 130/88  Pulse 90  Temp(Src) 98.3 F (36.8 C) (Oral)  Resp 20  SpO2 96% Physical Exam  Constitutional: He is oriented to person, place, and time. He appears well-developed and well-nourished. No distress.  HENT:  Head: Normocephalic and atraumatic.  Eyes: EOM are normal. Pupils are equal, round, and reactive to light.  Cardiovascular: Normal rate and regular rhythm.  Exam reveals no gallop and no friction rub.   No murmur heard. Pulmonary/Chest: Effort normal and breath sounds normal. No respiratory distress. He has no wheezes. He has no rales.  Abdominal: Soft. There is no tenderness.  Musculoskeletal:       Lumbar back: He exhibits tenderness (tenderness to the right of the lumbar spine) and pain. He exhibits normal range of motion, no bony tenderness and no deformity.  Neurological: He is oriented to person, place, and time. A sensory deficit is present.  Lacks position sense of digits right foot, lacks 2 point  discrimination, maintains sharp versus dull. The exam is normal on the left foot  Skin: Skin is warm and dry. No rash noted.  Psychiatric: He has a normal mood and affect. Judgment normal.    ED Course  Procedures (including critical care time) Labs Reviewed - No data to display Dg Lumbar Spine Complete  06/02/2013   *RADIOLOGY REPORT*  Clinical Data: Fall.  Back pain.  LUMBAR SPINE - COMPLETE 4+ VIEW  Comparison: One-view abdomen 12/10/2011.  CT abdomen pelvis 08/16/2011.  Findings: Bilateral L5 pars defects are again noted. Anterolisthesis at L5-S1 is stable.  Slight degenerative retrolisthesis at L1-2, L2-3, and L3-4 is stable.  Endplate degenerative changes at L1-2 and L2-3 have progressed anteriorly. Minimal atherosclerotic calcifications are evident.  IMPRESSION:  1.  Progressive degenerative endplate changes at L1-2 and L2-3. 2.  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   Original Report Authenticated By: Marin Roberts, M.D.   1. Fall at home, initial encounter   2. Back pain     MDM  This patient has an abnormal neurologic exam in the right distal extremity following a fall, he may need an MRI and more urgent management that could be obtained on outpatient referral. He is being transferred to the emergency department via shuttle for further workup  Graylon Good, PA-C 06/02/13 1816

## 2013-06-02 NOTE — H&P (Signed)
[facility] History and Physical  [name] WJX:[id number] DOB: [birth year]    PCP:   Ricki Rodriguez, MD   Chief Complaint: falling and leg weakness.  HPI: [name] is an 78 y.o. 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  He also noted his right lower leg to be weak.  He had no HA, Vx problems, focal weakness, palpitation, chest pain or shortness of breath.  He has some lower back pain, but denied radiation, and bowel or bladder incontincence.  Evaluation in the ER included a CT of the LS spine with no acute spinal pathology and CT of the pelvic showed no obtructive stone.  His serology was normal as well.  Hospitalist was asked to admit him for right lower leg weakness.  Rewiew of Systems:  Constitutional: Negative for malaise, fever and chills. No significant weight loss or weight gain Eyes: Negative for eye pain, redness and discharge, diplopia, visual changes, or flashes of light. ENMT: Negative for ear pain, hoarseness, nasal congestion, sinus pressure and sore throat. No headaches; tinnitus, drooling, or problem swallowing. Cardiovascular: Negative for chest pain, palpitations, diaphoresis, dyspnea and peripheral edema. ; No orthopnea, PND Respiratory: Negative for cough, hemoptysis, wheezing and stridor. No pleuritic chestpain. Gastrointestinal: Negative for nausea, vomiting, diarrhea, constipation, abdominal pain, melena, blood in stool, hematemesis, jaundice and rectal bleeding.    Genitourinary: Negative for frequency, dysuria, incontinence,flank pain and hematuria; Musculoskeletal: Negative for back pain and neck pain. Negative for swelling and trauma.;  Skin: . Negative for pruritus, rash, abrasions, bruising and skin lesion.; ulcerations Neuro: Negative  for headache, lightheadedness and neck stiffness. Negative for weakness, altered level of consciousness , altered mental status,  burning feet, involuntary movement, seizure and syncope.  Psych: negative for anxiety, depression, insomnia, tearfulness, panic attacks, hallucinations, paranoia, suicidal or homicidal ideation    Past Medical History  Diagnosis Date  . Hypertension   . Hyperlipidemia   . Right ureteral stone   . Hematuria     ureteral stone and stent  . Chronic kidney disease     left nephrolithiasis    Past Surgical History  Procedure Laterality Date  . Cystoscopy w/ ureteral stent placement  01-02-11    right  . Extracorporeal shock wave lithotripsy  02-05-11    right  . Cataract extraction w/ intraocular lens  implant, bilateral    . Cystoscopy with ureteroscopy  09/17/2011    Procedure: CYSTOSCOPY WITH URETEROSCOPY;  Surgeon: Milford Cage, MD;  Location: [facility];  Service: Urology;  Laterality: Right;  CYSTOSCOPY, RIGHT URETEROSCOPY WITH LASER LITHO AND STENT     Medications:  HOME MEDS: Prior to Admission medications   Medication Sig Start Date End Date Taking? Authorizing Provider  amLODipine (NORVASC) 5 MG tablet Take 5 mg by mouth every morning.     Yes Historical Provider, MD  aspirin EC 81 MG tablet Take 81 mg by mouth daily.   Yes Historical Provider, MD  Multiple Vitamins-Minerals (MULTIVITAMIN PO) Take 1 tablet by mouth daily.   Yes Historical Provider, MD  simvastatin (ZOCOR) 20 MG tablet Take 20 mg by mouth at bedtime.     Yes Historical Provider, MD     Allergies:  No Known Allergies  Social History:   reports that he  has never smoked. He has never used smokeless tobacco. He reports that he does not drink alcohol or use illicit drugs.  Family History: History reviewed. No pertinent family history.   Physical Exam: Filed Vitals:   06/02/13 2030 06/02/13 2130 06/02/13 2200 06/02/13 2230  BP: 123/86 115/82 121/78  134/78  Pulse: 66 66 64 74  Temp:      TempSrc:      Resp:      SpO2: 98% 96% 97% 95%   Blood pressure 134/78, pulse 74, temperature 98.7 F (37.1 C), temperature source Oral, resp. rate 16, SpO2 95.00%.  GEN:  Pleasant patient lying in the stretcher in no acute distress; cooperative with exam. PSYCH:  alert and oriented x4; does not appear anxious or depressed; affect is appropriate. HEENT: Mucous membranes pink and anicteric; PERRLA; EOM intact; no cervical lymphadenopathy nor thyromegaly or carotid bruit; no JVD; There were no stridor. Neck is very supple. Breasts:: Not examined CHEST WALL: No tenderness CHEST: Normal respiration, clear to auscultation bilaterally.  HEART: Regular rate and rhythm.  There are no murmur, rub, or gallops.   BACK: No kyphosis or scoliosis; no CVA tenderness ABDOMEN: soft and non-tender; no masses, no organomegaly, normal abdominal bowel sounds; no pannus; no intertriginous candida. There is no rebound and no distention. Rectal Exam: Not done EXTREMITIES: No bone or joint deformity; age-appropriate arthropathy of the hands and knees; no edema; no ulcerations.  There is no calf tenderness. Genitalia: not examined PULSES: 2+ and symmetric SKIN: Normal hydration no rash or ulceration CNS: Cranial nerves 2-12 grossly intact no focal lateralizing neurologic deficit.  Speech is fluent; uvula elevated with phonation, facial symmetry and tongue midline. cerebella exam is intact, barbinski is negative and strengths are equaled bilaterally.  No sensory loss.  He has borderline positive Rhomberg's   Labs on Admission:  Basic Metabolic Panel:  Recent Labs Lab 06/02/13 1955  NA 142  K 4.3  CL 103  CO2 29  GLUCOSE 96  BUN 10  CREATININE 0.74  CALCIUM 9.6   Liver Function Tests: No results found for this basename: AST, ALT, ALKPHOS, BILITOT, PROT, ALBUMIN,  in the last 168 hours No results found for this basename: LIPASE, AMYLASE,  in the last 168 hours No  results found for this basename: AMMONIA,  in the last 168 hours CBC:  Recent Labs Lab 06/02/13 1955  WBC 5.7  NEUTROABS 2.5  HGB 14.7  HCT 41.4  MCV 91.0  PLT 170   Cardiac Enzymes: No results found for this basename: CKTOTAL, CKMB, CKMBINDEX, TROPONINI,  in the last 168 hours  CBG: No results found for this basename: GLUCAP,  in the last 168 hours   Radiological Exams on Admission: Dg Lumbar Spine Complete  06/02/2013   *RADIOLOGY REPORT*  Clinical Data: Fall.  Back pain.  LUMBAR SPINE - COMPLETE 4+ VIEW  Comparison: One-view abdomen 12/10/2011.  CT abdomen pelvis 08/16/2011.  Findings: Bilateral L5 pars defects are again noted. Anterolisthesis at L5-S1 is stable.  Slight degenerative retrolisthesis at L1-2, L2-3, and L3-4 is stable.  Endplate degenerative changes at L1-2 and L2-3 have progressed anteriorly. Minimal atherosclerotic calcifications are evident.  IMPRESSION:  1.  Progressive degenerative endplate changes at L1-2 and L2-3. 2.  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   Original Report Authenticated By: Marin Roberts, M.D.   Ct Head Wo Contrast  06/02/2013   *RADIOLOGY REPORT*  Clinical Data: Fall.  Pain at the top of the head.  CT  HEAD WITHOUT CONTRAST  Technique:  Contiguous axial images were obtained from the base of the skull through the vertex without contrast.  Comparison: None.  Findings: No acute cortical infarct, hemorrhage, or mass lesion is present.  The ventricles are of normal size.  Mild periventricular white matter hypoattenuation is evident bilaterally.  No significant extra-axial fluid collection is present.  The paranasal sinuses and mastoid air cells are clear.  The osseous skull is intact.  IMPRESSION:  1.  Mild atrophy white matter disease is slightly advanced for age. This likely reflects the sequelae of chronic microvascular ischemia. 2.  No acute intracranial abnormality. No evidence for acute trauma.   Original Report  Authenticated By: Marin Roberts, M.D.   Ct Lumbar Spine Wo Contrast  06/02/2013   *RADIOLOGY REPORT*  Clinical Data: Fall.  Low back pain.  CT LUMBAR SPINE WITHOUT CONTRAST  Technique:  Multidetector CT imaging of the lumbar spine was performed without intravenous contrast administration. Multiplanar CT image reconstructions were also generated.  Comparison: Lumbar radiographs from the same day.  CT abdomen pelvis at [facility] 08/16/2011.  Findings: Five non-rib bearing lumbar type vertebral bodies are present.  No acute fracture is evident.  Vertebral body heights are maintained.  Endplate changes at T12-L1 and L1-2 where stable. Bilateral L5 pars defects are again noted.  Anterolisthesis of 7 mm is unchanged.  Right greater than left foraminal stenosis is associated with this anterolisthesis.  Slight retrolisthesis at L1- 2, L2-3, and L3-4 stable.  Atherosclerotic calcifications are present in the aorta without aneurysm.  Two nonobstructing right kidney stones are present.  The right ureter is mildly dilated compared the left.  No obstructing lesions are evident.  The distal ureter is not imaged.  IMPRESSION:  1.  Stable appearance of the lumbar spine. 2.  Bilateral L5 pars defects and associated anterolisthesis. 3.  Degenerative retrolisthesis at L1-2, L2-3, and L3-4. 4.  Mild dilation of the right to ureter and collecting system without a proximal obstructing lesion.  The distal ureter is incompletely imaged. 3.  Two nonobstructing stones in the right kidney measuring 3 mm maximally.   Original Report Authenticated By: Marin Roberts, M.D.   Ct Pelvis Wo Contrast  06/02/2013   *RADIOLOGY REPORT*  Clinical Data: Follow-up with abnormality of the right ureter demonstrated on CT lumbar spine.  CT PELVIS WITHOUT CONTRAST  Technique:  Multidetector CT imaging of the pelvis was performed following the standard protocol without intravenous contrast.  Comparison: CT urogram 08/16/2011.  CT lumbar  spine 06/02/2013  Findings: The lower poles of both kidneys are included within the study.  There is a small stone in the lower pole of the right kidney as seen in the previous lumbar spine CT. The intrarenal collecting system is not included. The visualized right ureter is decompressed.  No ureteral stones are demonstrated.  The left ureter is decompressed without stone identified.  There is a tiny punctate calcification noted in the dependent portion of the bladder which could represent a recently passed stone.  The bladder wall is not thickened.  No other intraluminal filling defects are demonstrated.  There is also calcification in the base of the penis which is stable since the previous CT urogram and likely represent vascular calcification.  Prostate gland is not enlarged.  No free or loculated pelvic fluid collections.  No significant pelvic lymphadenopathy.  No diverticulitis.  The appendix is normal. Degenerative changes in the lower lumbar spine.  Bilateral spondylolysis at L5-S1 with mild spondylolisthesis.  No destructive bone lesions appreciated.  IMPRESSION: Stone in the lower pole right kidney as previously demonstrated. Visualized ureters are decompressed.  No ureteral stones.  Tiny punctate calcification in the dependent bladder could represent a small stone.   Original Report Authenticated By: Burman Nieves, M.D.   Assessment/Plan Present on Admission:  . Ataxia . HTN (hypertension) . Hyperlipidemia  PLAN:  I wasn't sure what exactly went wrong with his nice gentleman, and therefore, I have asked for neuro consult.  Dr Jodi Mourning and I spoke afterward, and we felt that it is ataxia rather than weakness as this man's  presentation.  Will obtain MRI/MRA of the brain to see if he has a posterior CVA.  Otherwise, will also get TSH, B12, heavy metal concentration.  He will likely need OT/PT.  For his HTN and hyperlipidemia, I have continued his medication, and continued his ASA.  He is stable,  full code, and will be admitted to [facility] service.  Thank you for allowing me to partake in the care of this nice patient.  Other plans as per orders.  Code Status: FULL Unk Lightning, MD. [facility] Pager [phone] 7pm to 7am.  06/02/2013, 11:20 PM

## 2013-06-02 NOTE — ED Provider Notes (Signed)
History    CSN: [id number] Arrival date & time 06/02/13  1839  First MD Initiated Contact with Patient 06/02/13 1841     Chief Complaint  Patient presents with  . Fall   (Consider location/radiation/quality/duration/timing/severity/associated sxs/prior Treatment) HPI Comments: 78 yo male with no medical hx, occasionally take baby asa presents from prompt care for lower back pain and right great toe weakness.  Pt on Saturday had mechanical fall at work-- denies sxs prior or after except noticed right foot dragging since fall.  Pt feels this started that day.  No ha or other weakness.  No bowel, bladder, incontinence, fevers, recent surgeries or DM.  Worse with walking. Translated by family.  Pt did receive Vit B12 shot recently, vegetarian. Non smoker  Patient is a 78 y.o. male presenting with fall. The history is provided by the patient, a relative and a caregiver.  Fall This is a new problem. Pertinent negatives include no chest pain, no abdominal pain, no headaches and no shortness of breath.   Past Medical History  Diagnosis Date  . Hypertension   . Hyperlipidemia   . Right ureteral stone   . Hematuria     ureteral stone and stent  . Chronic kidney disease     left nephrolithiasis   Past Surgical History  Procedure Laterality Date  . Cystoscopy w/ ureteral stent placement  01-02-11    right  . Extracorporeal shock wave lithotripsy  02-05-11    right  . Cataract extraction w/ intraocular lens  implant, bilateral    . Cystoscopy with ureteroscopy  09/17/2011    Procedure: CYSTOSCOPY WITH URETEROSCOPY;  Surgeon: Milford Cage, MD;  Location: [facility];  Service: Urology;  Laterality: Right;  CYSTOSCOPY, RIGHT URETEROSCOPY WITH LASER LITHO AND STENT    History reviewed. No pertinent family history. History  Substance Use Topics  . Smoking status: Never Smoker   . Smokeless tobacco: Never Used  . Alcohol Use: No    Review of Systems   Constitutional: Negative for fever and chills.  HENT: Negative for neck pain and neck stiffness.   Eyes: Negative for visual disturbance.  Respiratory: Negative for shortness of breath.   Cardiovascular: Negative for chest pain.  Gastrointestinal: Negative for vomiting and abdominal pain.  Genitourinary: Negative for dysuria and flank pain.  Musculoskeletal: Positive for back pain and gait problem.  Skin: Negative for rash.  Neurological: Positive for weakness. Negative for light-headedness and headaches.    Allergies  Review of patient's allergies indicates no known allergies.  Home Medications   Current Outpatient Rx  Name  Route  Sig  Dispense  Refill  . amLODipine (NORVASC) 5 MG tablet   Oral   Take 5 mg by mouth every morning.           [name] aspirin EC 81 MG tablet   Oral   Take 81 mg by mouth daily.         . Multiple Vitamins-Minerals (MULTIVITAMIN PO)   Oral   Take 1 tablet by mouth daily.         . simvastatin (ZOCOR) 20 MG tablet   Oral   Take 20 mg by mouth at bedtime.            BP 123/84  Pulse 66  Temp(Src) 98.7 F (37.1 C) (Oral)  Resp 16  SpO2 99% Physical Exam  Nursing note and vitals reviewed. Constitutional: He is oriented to person, place, and time. He appears well-developed and well-nourished.  HENT:  Head: Normocephalic and atraumatic.  Eyes: Conjunctivae are normal. Right eye exhibits no discharge. Left eye exhibits no discharge.  Neck: Normal range of motion. Neck supple. No tracheal deviation present.  Cardiovascular: Normal rate and regular rhythm.   Pulmonary/Chest: Effort normal and breath sounds normal.  Abdominal: Soft. He exhibits no distension. There is no tenderness. There is no guarding.  Genitourinary:  Normal perianal sensation to sharp, strong rectal tone  Musculoskeletal: He exhibits tenderness (right lower lumbar, no midline tenderness or step off). He exhibits no edema.  Neurological: He is alert and oriented to  person, place, and time. No cranial nerve deficit. GCS eye subscore is 4. GCS verbal subscore is 5. GCS motor subscore is 6.  4+ strength with great toe extension on right 5+ strength with E/F of hips, knees bilateral LE Sensation to sharp intact in all major nerves in LE. Unstable gait Finger nose intact bilateral- cautious with right   Skin: Skin is warm. No rash noted.  Psychiatric: He has a normal mood and affect.    ED Course  Procedures (including critical care time) Labs Reviewed  BASIC METABOLIC PANEL - Abnormal; Notable for the following:    GFR calc non Af Amer 87 (*)    All other components within normal limits  GLUCOSE, CAPILLARY - Abnormal; Notable for the following:    Glucose-Capillary 103 (*)    All other components within normal limits  CBC WITH DIFFERENTIAL  URINALYSIS, ROUTINE W REFLEX MICROSCOPIC  SEDIMENTATION RATE  C-REACTIVE PROTEIN  VITAMIN B12  FOLATE  HEAVY METALS SCREEN, URINE  TSH  VITAMIN B1  CERULOPLASMIN  LIPID PANEL  URINE RAPID DRUG SCREEN (HOSP PERFORMED)  HEMOGLOBIN A1C  HIV ANTIBODY (ROUTINE TESTING)   Dg Lumbar Spine Complete  06/02/2013   *RADIOLOGY REPORT*  Clinical Data: Fall.  Back pain.  LUMBAR SPINE - COMPLETE 4+ VIEW  Comparison: One-view abdomen 12/10/2011.  CT abdomen pelvis 08/16/2011.  Findings: Bilateral L5 pars defects are again noted. Anterolisthesis at L5-S1 is stable.  Slight degenerative retrolisthesis at L1-2, L2-3, and L3-4 is stable.  Endplate degenerative changes at L1-2 and L2-3 have progressed anteriorly. Minimal atherosclerotic calcifications are evident.  IMPRESSION:  1.  Progressive degenerative endplate changes at L1-2 and L2-3. 2.  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   Original Report Authenticated By: Marin Roberts, M.D.   No diagnosis found.  MDM  Concern for cause of fall.  Vit B12 vs right LE neuropathy vs stroke vs other.  Dorsiflexion weakness on exam and unsteady gait.  Pain meds given. Spoke with Dr Conley Rolls, agreed with admission, spoke with Neuro on call for consult.  Rechecked, comfortable, no changes.   Updated family, further evaluation inpt, possible MRI inpt lumbar and or brain.   Dg Lumbar Spine Complete  06/02/2013   *RADIOLOGY REPORT*  Clinical Data: Fall.  Back pain.  LUMBAR SPINE - COMPLETE 4+ VIEW  Comparison: One-view abdomen 12/10/2011.  CT abdomen pelvis 08/16/2011.  Findings: Bilateral L5 pars defects are again noted. Anterolisthesis at L5-S1 is stable.  Slight degenerative retrolisthesis at L1-2, L2-3, and L3-4 is stable.  Endplate degenerative changes at L1-2 and L2-3 have progressed anteriorly. Minimal atherosclerotic calcifications are evident.  IMPRESSION:  1.  Progressive degenerative endplate changes at L1-2 and L2-3. 2.  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   Original Report Authenticated By: Marin Roberts, M.D.   Ct Head Wo Contrast  06/02/2013   *RADIOLOGY REPORT*  Clinical  Data: Fall.  Pain at the top of the head.  CT HEAD WITHOUT CONTRAST  Technique:  Contiguous axial images were obtained from the base of the skull through the vertex without contrast.  Comparison: None.  Findings: No acute cortical infarct, hemorrhage, or mass lesion is present.  The ventricles are of normal size.  Mild periventricular white matter hypoattenuation is evident bilaterally.  No significant extra-axial fluid collection is present.  The paranasal sinuses and mastoid air cells are clear.  The osseous skull is intact.  IMPRESSION:  1.  Mild atrophy white matter disease is slightly advanced for age. This likely reflects the sequelae of chronic microvascular ischemia. 2.  No acute intracranial abnormality. No evidence for acute trauma.   Original Report Authenticated By: Marin Roberts, M.D.   Ct Lumbar Spine Wo Contrast  06/02/2013   *RADIOLOGY REPORT*  Clinical Data: Fall.  Low back pain.  CT LUMBAR SPINE WITHOUT CONTRAST   Technique:  Multidetector CT imaging of the lumbar spine was performed without intravenous contrast administration. Multiplanar CT image reconstructions were also generated.  Comparison: Lumbar radiographs from the same day.  CT abdomen pelvis at [facility] 08/16/2011.  Findings: Five non-rib bearing lumbar type vertebral bodies are present.  No acute fracture is evident.  Vertebral body heights are maintained.  Endplate changes at T12-L1 and L1-2 where stable. Bilateral L5 pars defects are again noted.  Anterolisthesis of 7 mm is unchanged.  Right greater than left foraminal stenosis is associated with this anterolisthesis.  Slight retrolisthesis at L1- 2, L2-3, and L3-4 stable.  Atherosclerotic calcifications are present in the aorta without aneurysm.  Two nonobstructing right kidney stones are present.  The right ureter is mildly dilated compared the left.  No obstructing lesions are evident.  The distal ureter is not imaged.  IMPRESSION:  1.  Stable appearance of the lumbar spine. 2.  Bilateral L5 pars defects and associated anterolisthesis. 3.  Degenerative retrolisthesis at L1-2, L2-3, and L3-4. 4.  Mild dilation of the right to ureter and collecting system without a proximal obstructing lesion.  The distal ureter is incompletely imaged. 3.  Two nonobstructing stones in the right kidney measuring 3 mm maximally.   Original Report Authenticated By: Marin Roberts, M.D.   Ct Pelvis Wo Contrast  06/02/2013   *RADIOLOGY REPORT*  Clinical Data: Follow-up with abnormality of the right ureter demonstrated on CT lumbar spine.  CT PELVIS WITHOUT CONTRAST  Technique:  Multidetector CT imaging of the pelvis was performed following the standard protocol without intravenous contrast.  Comparison: CT urogram 08/16/2011.  CT lumbar spine 06/02/2013  Findings: The lower poles of both kidneys are included within the study.  There is a small stone in the lower pole of the right kidney as seen in the previous  lumbar spine CT. The intrarenal collecting system is not included. The visualized right ureter is decompressed.  No ureteral stones are demonstrated.  The left ureter is decompressed without stone identified.  There is a tiny punctate calcification noted in the dependent portion of the bladder which could represent a recently passed stone.  The bladder wall is not thickened.  No other intraluminal filling defects are demonstrated.  There is also calcification in the base of the penis which is stable since the previous CT urogram and likely represent vascular calcification.  Prostate gland is not enlarged.  No free or loculated pelvic fluid collections.  No significant pelvic lymphadenopathy.  No diverticulitis.  The appendix is normal. Degenerative changes in the  lower lumbar spine.  Bilateral spondylolysis at L5-S1 with mild spondylolisthesis.  No destructive bone lesions appreciated.  IMPRESSION: Stone in the lower pole right kidney as previously demonstrated. Visualized ureters are decompressed.  No ureteral stones.  Tiny punctate calcification in the dependent bladder could represent a small stone.   Original Report Authenticated By: Burman Nieves, M.D.     Enid Skeens, MD 06/03/13 [phone]

## 2013-06-02 NOTE — Consult Note (Signed)
Referring Physician: Jodi Mourning    Chief Complaint: Right leg weakness  HPI: [name] is an 78 y.o. male who is usually very active.  Was at work on 7/17 and fell.  Since his fall has had some low back pain and noted that his right leg was weak and he had poor balance.  He has continued to have poor balance with gait and with no improvement in his symptoms presented today for evaluation.  There has been no associated numbness, visual complaints or speech deficits.  No double vision or vertigo.    Date last known well: Date: 05/28/2013 Time last known well: Unable to determine tPA Given: No: Outside time window  Past Medical History  Diagnosis Date  . Hypertension   . Hyperlipidemia   . Right ureteral stone   . Hematuria     ureteral stone and stent  . Chronic kidney disease     left nephrolithiasis    Past Surgical History  Procedure Laterality Date  . Cystoscopy w/ ureteral stent placement  01-02-11    right  . Extracorporeal shock wave lithotripsy  02-05-11    right  . Cataract extraction w/ intraocular lens  implant, bilateral    . Cystoscopy with ureteroscopy  09/17/2011    Procedure: CYSTOSCOPY WITH URETEROSCOPY;  Surgeon: Milford Cage, MD;  [facility];  Service: Urology;  Laterality: Right;  CYSTOSCOPY, RIGHT URETEROSCOPY WITH LASER LITHO AND STENT     Family history: Difficult to obtain due to language barrier. No family history of strokes  Social History:  reports that he has never smoked. He has never used smokeless tobacco. He reports that he does not drink alcohol or use illicit drugs.  Allergies: No Known Allergies  Medications: I have reviewed the patient's current medications. Prior to Admission:  Current outpatient prescriptions:amLODipine (NORVASC) 5 MG tablet, Take 5 mg by mouth every morning.  , Disp: , Rfl: ;  aspirin EC 81 MG tablet, Take 81 mg by mouth daily., Disp: , Rfl: ;  Multiple Vitamins-Minerals (MULTIVITAMIN  PO), Take 1 tablet by mouth daily., Disp: , Rfl: ;  simvastatin (ZOCOR) 20 MG tablet, Take 20 mg by mouth at bedtime.  , Disp: , Rfl:   ROS: History obtained from the patient  General ROS: negative for - chills, fatigue, fever, night sweats, weight gain or weight loss Psychological ROS: negative for - behavioral disorder, hallucinations, memory difficulties, mood swings or suicidal ideation Ophthalmic ROS: negative for - blurry vision, double vision, eye pain or loss of vision ENT ROS: negative for - epistaxis, nasal discharge, oral lesions, sore throat, tinnitus or vertigo Allergy and Immunology ROS: negative for - hives or itchy/watery eyes Hematological and Lymphatic ROS: negative for - bleeding problems, bruising or swollen lymph nodes Endocrine ROS: negative for - galactorrhea, hair pattern changes, polydipsia/polyuria or temperature intolerance Respiratory ROS: negative for - cough, hemoptysis, shortness of breath or wheezing Cardiovascular ROS: negative for - chest pain, dyspnea on exertion, edema or irregular heartbeat Gastrointestinal ROS: negative for - abdominal pain, diarrhea, hematemesis, nausea/vomiting or stool incontinence Genito-Urinary ROS: negative for - dysuria, hematuria, incontinence or urinary frequency/urgency Musculoskeletal ROS: as noted in HPI Neurological ROS: as noted in HPI Dermatological ROS: negative for rash and skin lesion changes  Physical Examination: Blood pressure 134/78, pulse 74, temperature 98.7 F (37.1 C), temperature source Oral, resp. rate 16, SpO2 95.00%.  Neurologic Examination: Mental Status: Alert, oriented, thought content appropriate.  Speech fluent without evidence of aphasia.  Able  to follow 3 step commands without difficulty. Cranial Nerves: II: Discs flat bilaterally; Visual fields grossly normal, pupils equal, round, reactive to light and accommodation III,IV, VI: ptosis not present, extra-ocular motions intact bilaterally V,VII:  smile symmetric, facial light touch sensation normal bilaterally VIII: hearing normal bilaterally IX,X: gag reflex present XI: bilateral shoulder shrug XII: midline tongue extension Motor: Right : Upper extremity   5/5    Left:     Upper extremity   5/5 Patient is 5/5 in the left lower extremity.  4/5 right hip flexor strength, 5/5 quadriceps, 5/5 hamstrings, 1-2/5 plantar flexion, 5/5 plantar extension.  Inversion and eversion intact Tone and bulk:normal tone throughout; no atrophy noted Sensory: Pinprick and light touch intact throughout, bilaterally Deep Tendon Reflexes: 2+ throughout with absent AJ's bilaterally Plantars: Right: mute   Left: downgoing Cerebellar: normal finger-to-nose and normal heel-to-shin test Gait: On standing patient starts to fall to the right.  Stance narrow based.  With attempts at walking, steps small and shuffling.  Advances right leg less than left.   CV: pulses palpable throughout   Laboratory Studies:  Basic Metabolic Panel:  Recent Labs Lab 06/02/13 1955  NA 142  K 4.3  CL 103  CO2 29  GLUCOSE 96  BUN 10  CREATININE 0.74  CALCIUM 9.6    Liver Function Tests: No results found for this basename: AST, ALT, ALKPHOS, BILITOT, PROT, ALBUMIN,  in the last 168 hours No results found for this basename: LIPASE, AMYLASE,  in the last 168 hours No results found for this basename: AMMONIA,  in the last 168 hours  CBC:  Recent Labs Lab 06/02/13 1955  WBC 5.7  NEUTROABS 2.5  HGB 14.7  HCT 41.4  MCV 91.0  PLT 170    Cardiac Enzymes: No results found for this basename: CKTOTAL, CKMB, CKMBINDEX, TROPONINI,  in the last 168 hours  BNP: No components found with this basename: POCBNP,   CBG: No results found for this basename: GLUCAP,  in the last 168 hours  Microbiology: No results found for this or any previous visit.  Coagulation Studies: No results found for this basename: LABPROT, INR,  in the last 72 hours  Urinalysis:  Recent  Labs Lab 06/02/13 2158  COLORURINE YELLOW  LABSPEC 1.011  PHURINE 7.0  GLUCOSEU NEGATIVE  HGBUR NEGATIVE  BILIRUBINUR NEGATIVE  KETONESUR NEGATIVE  PROTEINUR NEGATIVE  UROBILINOGEN 0.2  NITRITE NEGATIVE  LEUKOCYTESUR NEGATIVE    Lipid Panel: No results found for this basename: chol, trig, hdl, cholhdl, vldl, ldlcalc    HgbA1C:  No results found for this basename: HGBA1C    Urine Drug Screen:   No results found for this basename: labopia, cocainscrnur, labbenz, amphetmu, thcu, labbarb    Alcohol Level: No results found for this basename: ETH,  in the last 168 hours  Imaging: Dg Lumbar Spine Complete  06/02/2013   *RADIOLOGY REPORT*  Clinical Data: Fall.  Back pain.  LUMBAR SPINE - COMPLETE 4+ VIEW  Comparison: One-view abdomen 12/10/2011.  CT abdomen pelvis 08/16/2011.  Findings: Bilateral L5 pars defects are again noted. Anterolisthesis at L5-S1 is stable.  Slight degenerative retrolisthesis at L1-2, L2-3, and L3-4 is stable.  Endplate degenerative changes at L1-2 and L2-3 have progressed anteriorly. Minimal atherosclerotic calcifications are evident.  IMPRESSION:  1.  Progressive degenerative endplate changes at L1-2 and L2-3. 2.  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   Original Report Authenticated By: Marin Roberts, M.D.   Ct Lumbar Spine Wo Contrast  06/02/2013   *RADIOLOGY REPORT*  Clinical Data: Fall.  Low back pain.  CT LUMBAR SPINE WITHOUT CONTRAST  Technique:  Multidetector CT imaging of the lumbar spine was performed without intravenous contrast administration. Multiplanar CT image reconstructions were also generated.  Comparison: Lumbar radiographs from the same day.  CT abdomen pelvis at [facility] 08/16/2011.  Findings: Five non-rib bearing lumbar type vertebral bodies are present.  No acute fracture is evident.  Vertebral body heights are maintained.  Endplate changes at T12-L1 and L1-2 where stable. Bilateral L5 pars defects are  again noted.  Anterolisthesis of 7 mm is unchanged.  Right greater than left foraminal stenosis is associated with this anterolisthesis.  Slight retrolisthesis at L1- 2, L2-3, and L3-4 stable.  Atherosclerotic calcifications are present in the aorta without aneurysm.  Two nonobstructing right kidney stones are present.  The right ureter is mildly dilated compared the left.  No obstructing lesions are evident.  The distal ureter is not imaged.  IMPRESSION:  1.  Stable appearance of the lumbar spine. 2.  Bilateral L5 pars defects and associated anterolisthesis. 3.  Degenerative retrolisthesis at L1-2, L2-3, and L3-4. 4.  Mild dilation of the right to ureter and collecting system without a proximal obstructing lesion.  The distal ureter is incompletely imaged. 3.  Two nonobstructing stones in the right kidney measuring 3 mm maximally.   Original Report Authenticated By: Marin Roberts, M.D.   Ct Pelvis Wo Contrast  06/02/2013   *RADIOLOGY REPORT*  Clinical Data: Follow-up with abnormality of the right ureter demonstrated on CT lumbar spine.  CT PELVIS WITHOUT CONTRAST  Technique:  Multidetector CT imaging of the pelvis was performed following the standard protocol without intravenous contrast.  Comparison: CT urogram 08/16/2011.  CT lumbar spine 06/02/2013  Findings: The lower poles of both kidneys are included within the study.  There is a small stone in the lower pole of the right kidney as seen in the previous lumbar spine CT. The intrarenal collecting system is not included. The visualized right ureter is decompressed.  No ureteral stones are demonstrated.  The left ureter is decompressed without stone identified.  There is a tiny punctate calcification noted in the dependent portion of the bladder which could represent a recently passed stone.  The bladder wall is not thickened.  No other intraluminal filling defects are demonstrated.  There is also calcification in the base of the penis which is stable  since the previous CT urogram and likely represent vascular calcification.  Prostate gland is not enlarged.  No free or loculated pelvic fluid collections.  No significant pelvic lymphadenopathy.  No diverticulitis.  The appendix is normal. Degenerative changes in the lower lumbar spine.  Bilateral spondylolysis at L5-S1 with mild spondylolisthesis.  No destructive bone lesions appreciated.  IMPRESSION: Stone in the lower pole right kidney as previously demonstrated. Visualized ureters are decompressed.  No ureteral stones.  Tiny punctate calcification in the dependent bladder could represent a small stone.   Original Report Authenticated By: Burman Nieves, M.D.    Assessment: 78 y.o. male presenting with difficulty with gait and right leg weakness.  On exam his most pronounced finding is that if balance.  His gait issues are actually far more pronounced than his weakness and his gait is not what would be expected with a pure foot drop.   CT of the lumbar spine has been reviewed and shows no pathology to explain his symptoms.  There are no bowel or bladder complaints.   Etiology  is unclear.  Further work up recommended.    Stroke Risk Factors - hyperlipidemia and hypertension  Plan: 1. MRI, MRA  of the brain without contrast.  Would rule out a posterior circulation event possibly related to the patients history of hypertension 2. Would not initiate stroke work up unless MRI diagnostic of an ischemic event.   3. PT consult, OT consult 4. Continue ASA 7. ESR, CRP, B12, folate, TSH, copper, heavy metal screen   Thana Farr, MD [facility] [phone] 06/02/2013, 10:46 PM

## 2013-06-03 ENCOUNTER — Inpatient Hospital Stay (HOSPITAL_COMMUNITY): Payer: Medicare Other

## 2013-06-03 ENCOUNTER — Encounter (HOSPITAL_COMMUNITY): Payer: Self-pay | Admitting: *Deleted

## 2013-06-03 DIAGNOSIS — W19XXXA Unspecified fall, initial encounter: Secondary | ICD-10-CM

## 2013-06-03 LAB — RAPID URINE DRUG SCREEN, HOSP PERFORMED
Amphetamines: NOT DETECTED
Benzodiazepines: NOT DETECTED
Cocaine: NOT DETECTED
Opiates: NOT DETECTED
Tetrahydrocannabinol: NOT DETECTED

## 2013-06-03 LAB — LIPID PANEL: LDL Cholesterol: 73 mg/dL (ref 0–99)

## 2013-06-03 LAB — FOLATE: Folate: 15.1 ng/mL

## 2013-06-03 LAB — T4, FREE: Free T4: 0.85 ng/dL (ref 0.80–1.80)

## 2013-06-03 LAB — SEDIMENTATION RATE: Sed Rate: 5 mm/hr (ref 0–16)

## 2013-06-03 LAB — C-REACTIVE PROTEIN: CRP: <0.5 mg/dL — ABNORMAL LOW (ref <0.60)

## 2013-06-03 MED ORDER — HEPARIN SODIUM (PORCINE) 5000 UNIT/ML IJ SOLN
5000.0000 [IU] | Freq: Three times a day (TID) | INTRAMUSCULAR | Status: DC
  Administered 2013-06-03 – 2013-06-04 (×4): 5000 [IU] via SUBCUTANEOUS
  Filled 2013-06-03 (×7): qty 1

## 2013-06-03 MED ORDER — ASPIRIN EC 81 MG PO TBEC
81.0000 mg | DELAYED_RELEASE_TABLET | Freq: Every day | ORAL | Status: DC
  Administered 2013-06-03 – 2013-06-04 (×2): 81 mg via ORAL
  Filled 2013-06-03 (×2): qty 1

## 2013-06-03 MED ORDER — AMLODIPINE BESYLATE 5 MG PO TABS
5.0000 mg | ORAL_TABLET | Freq: Every day | ORAL | Status: DC
  Administered 2013-06-04: 5 mg via ORAL
  Filled 2013-06-03 (×2): qty 1

## 2013-06-03 MED ORDER — HYDROCODONE-ACETAMINOPHEN 5-325 MG PO TABS
1.0000 | ORAL_TABLET | Freq: Four times a day (QID) | ORAL | Status: DC | PRN
  Administered 2013-06-03: 1 via ORAL
  Filled 2013-06-03 (×2): qty 1

## 2013-06-03 MED ORDER — PNEUMOCOCCAL VAC POLYVALENT 25 MCG/0.5ML IJ INJ
0.5000 mL | INJECTION | INTRAMUSCULAR | Status: AC
  Administered 2013-06-04: 0.5 mL via INTRAMUSCULAR
  Filled 2013-06-03: qty 0.5

## 2013-06-03 MED ORDER — LEVOTHYROXINE SODIUM 25 MCG PO TABS
25.0000 ug | ORAL_TABLET | Freq: Every day | ORAL | Status: DC
  Administered 2013-06-04: 25 ug via ORAL
  Filled 2013-06-03 (×2): qty 1

## 2013-06-03 MED ORDER — SIMVASTATIN 20 MG PO TABS
20.0000 mg | ORAL_TABLET | Freq: Every day | ORAL | Status: DC
  Administered 2013-06-03 (×2): 20 mg via ORAL
  Filled 2013-06-03 (×3): qty 1

## 2013-06-03 MED ORDER — LIDOCAINE 5 % EX PTCH
1.0000 | MEDICATED_PATCH | Freq: Every day | CUTANEOUS | Status: DC
  Administered 2013-06-03 – 2013-06-04 (×2): 1 via TRANSDERMAL
  Filled 2013-06-03 (×2): qty 1

## 2013-06-03 NOTE — Progress Notes (Signed)
   CARE MANAGEMENT NOTE 06/03/2013  Patient:  [name], [name]   Account Number:  [id number]  Date Initiated:  06/03/2013  Documentation initiated by:  Jiles Crocker  Subjective/Objective Assessment:   ADMITTED WITH CVA     Action/Plan:   PCP:   Ricki Rodriguez, MD  LIVES AT HOME WITH SPOUSE; CM FOLLOWING FOR DCP   Anticipated DC Date:  06/10/2013   Anticipated DC Plan:  POSSIBLY HOME W HOME HEALTH SERVICES      DC Planning Services  CM consult             Status of service:  In process, will continue to follow Medicare Important Message given?  NA - LOS <3 / Initial given by admissions (If response is "NO", the following Medicare IM given date fields will be blank)  Per UR Regulation:  Reviewed for med. necessity/level of care/duration of stay  Comments:  06/03/2013- B Jerrion Tabbert RN,BSN,MHA

## 2013-06-03 NOTE — Progress Notes (Signed)
NEURO HOSPITALIST PROGRESS NOTE   SUBJECTIVE:                                                                                                                        Patient is complaining of pain that is radiating down the back of his right leg into his right toe.   OBJECTIVE:                                                                                                                           Vital signs in last 24 hours: Temp:  [97.2 F (36.2 C)-98.7 F (37.1 C)] 97.2 F (36.2 C) (07/23 0954) Pulse Rate:  [59-90] 70 (07/23 0954) Resp:  [16-20] 18 (07/23 0954) BP: (103-142)/(65-90) 103/65 mmHg (07/23 0954) SpO2:  [95 %-99 %] 97 % (07/23 0954) Weight:  [145 lb 4.8 oz (65.908 kg)] 145 lb 4.8 oz (65.908 kg) (07/23 0028)  Intake/Output from previous day:   Intake/Output this shift:   Nutritional status: Vegetarian  Past Medical History  Diagnosis Date  . Hypertension   . Hyperlipidemia   . Right ureteral stone   . Hematuria     ureteral stone and stent  . Chronic kidney disease     left nephrolithiasis     Neurologic Exam:  Mental Status: Alert, oriented, thought content appropriate.  Speech fluent without evidence of aphasia.  Able to follow 3 step commands without difficulty. Cranial Nerves: II:  Visual fields grossly normal, pupils equal, round, reactive to light and accommodation III,IV, VI: ptosis not present, extra-ocular motions intact bilaterally V,VII: smile symmetric, facial light touch sensation normal bilaterally VIII: hearing normal bilaterally IX,X: gag reflex present XI: bilateral shoulder shrug XII: midline tongue extension Motor: Right : Upper extremity   5/5    Left:     Upper extremity   5/5  Lower extremity   5/5     Lower extremity   5/5  DF 5/5, PF 4/5     DF 4/5 PF 5/5 Tone and bulk:normal tone throughout; no atrophy noted Sensory: Pinprick and light touch intact throughout, bilaterally Deep Tendon  Reflexes:  Right: Upper Extremity   Left: Upper extremity   biceps (C-5 to C-6) 2/4   biceps (C-5 to  C-6) 2/4 tricep (C7) 2/4    triceps (C7) 2/4 Brachioradialis (C6) 2/4  Brachioradialis (C6) 2/4  Lower Extremity Lower Extremity  quadriceps (L-2 to L-4) 0/4   quadriceps (L-2 to L-4) 1/4 Achilles (S1) 0/4   Achilles (S1) 0/4  Plantars: Right: downgoing   Left: downgoing Cerebellar: normal finger-to-nose,  normal heel-to-shin test CV: pulses palpable throughout    Lab Results: Lab Results  Component Value Date/Time   CHOL 156 06/03/2013  5:39 AM   Lipid Panel  Recent Labs  06/03/13 0539  CHOL 156  TRIG 230*  HDL 37*  CHOLHDL 4.2  VLDL 46*  LDLCALC 73    Studies/Results: Dg Lumbar Spine Complete  06/02/2013   *RADIOLOGY REPORT*  Clinical Data: Fall.  Back pain.  LUMBAR SPINE - COMPLETE 4+ VIEW  Comparison: One-view abdomen 12/10/2011.  CT abdomen pelvis 08/16/2011.  Findings: Bilateral L5 pars defects are again noted. Anterolisthesis at L5-S1 is stable.  Slight degenerative retrolisthesis at L1-2, L2-3, and L3-4 is stable.  Endplate degenerative changes at L1-2 and L2-3 have progressed anteriorly. Minimal atherosclerotic calcifications are evident.  IMPRESSION:  1.  Progressive degenerative endplate changes at L1-2 and L2-3. 2.  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   Original Report Authenticated By: Marin Roberts, M.D.   Ct Head Wo Contrast  06/02/2013   *RADIOLOGY REPORT*  Clinical Data: Fall.  Pain at the top of the head.  CT HEAD WITHOUT CONTRAST  Technique:  Contiguous axial images were obtained from the base of the skull through the vertex without contrast.  Comparison: None.  Findings: No acute cortical infarct, hemorrhage, or mass lesion is present.  The ventricles are of normal size.  Mild periventricular white matter hypoattenuation is evident bilaterally.  No significant extra-axial fluid collection is present.  The paranasal sinuses  and mastoid air cells are clear.  The osseous skull is intact.  IMPRESSION:  1.  Mild atrophy white matter disease is slightly advanced for age. This likely reflects the sequelae of chronic microvascular ischemia. 2.  No acute intracranial abnormality. No evidence for acute trauma.   Original Report Authenticated By: Marin Roberts, M.D.   Ct Lumbar Spine Wo Contrast  06/02/2013   *RADIOLOGY REPORT*  Clinical Data: Fall.  Low back pain.  CT LUMBAR SPINE WITHOUT CONTRAST  Technique:  Multidetector CT imaging of the lumbar spine was performed without intravenous contrast administration. Multiplanar CT image reconstructions were also generated.  Comparison: Lumbar radiographs from the same day.  CT abdomen pelvis at [facility] 08/16/2011.  Findings: Five non-rib bearing lumbar type vertebral bodies are present.  No acute fracture is evident.  Vertebral body heights are maintained.  Endplate changes at T12-L1 and L1-2 where stable. Bilateral L5 pars defects are again noted.  Anterolisthesis of 7 mm is unchanged.  Right greater than left foraminal stenosis is associated with this anterolisthesis.  Slight retrolisthesis at L1- 2, L2-3, and L3-4 stable.  Atherosclerotic calcifications are present in the aorta without aneurysm.  Two nonobstructing right kidney stones are present.  The right ureter is mildly dilated compared the left.  No obstructing lesions are evident.  The distal ureter is not imaged.  IMPRESSION:  1.  Stable appearance of the lumbar spine. 2.  Bilateral L5 pars defects and associated anterolisthesis. 3.  Degenerative retrolisthesis at L1-2, L2-3, and L3-4. 4.  Mild dilation of the right to ureter and collecting system without a proximal obstructing lesion.  The distal ureter is incompletely imaged. 3.  Two nonobstructing stones in the right kidney measuring 3 mm maximally.   Original Report Authenticated By: Marin Roberts, M.D.   Ct Pelvis Wo Contrast  06/02/2013   *RADIOLOGY  REPORT*  Clinical Data: Follow-up with abnormality of the right ureter demonstrated on CT lumbar spine.  CT PELVIS WITHOUT CONTRAST  Technique:  Multidetector CT imaging of the pelvis was performed following the standard protocol without intravenous contrast.  Comparison: CT urogram 08/16/2011.  CT lumbar spine 06/02/2013  Findings: The lower poles of both kidneys are included within the study.  There is a small stone in the lower pole of the right kidney as seen in the previous lumbar spine CT. The intrarenal collecting system is not included. The visualized right ureter is decompressed.  No ureteral stones are demonstrated.  The left ureter is decompressed without stone identified.  There is a tiny punctate calcification noted in the dependent portion of the bladder which could represent a recently passed stone.  The bladder wall is not thickened.  No other intraluminal filling defects are demonstrated.  There is also calcification in the base of the penis which is stable since the previous CT urogram and likely represent vascular calcification.  Prostate gland is not enlarged.  No free or loculated pelvic fluid collections.  No significant pelvic lymphadenopathy.  No diverticulitis.  The appendix is normal. Degenerative changes in the lower lumbar spine.  Bilateral spondylolysis at L5-S1 with mild spondylolisthesis.  No destructive bone lesions appreciated.  IMPRESSION: Stone in the lower pole right kidney as previously demonstrated. Visualized ureters are decompressed.  No ureteral stones.  Tiny punctate calcification in the dependent bladder could represent a small stone.   Original Report Authenticated By: Burman Nieves, M.D.    MEDICATIONS                                                                                                                        Scheduled: . amLODipine  5 mg Oral Daily  . aspirin EC  81 mg Oral Daily  . heparin  5,000 Units Subcutaneous Q8H  . [START ON 06/04/2013]  pneumococcal 23 valent vaccine  0.5 mL Intramuscular Tomorrow-1000  . simvastatin  20 mg Oral QHS    ASSESSMENT/PLAN:                                                                                                             78 YO male with right leg pain and weakness S/P fall.  CT L-spine shows no significant pathology to explain symptoms.  Only weakness noted today on exam was bilateral DF weakness.   Pending: 1) MRI, MRA head pending 2) ESR, CRP, B12, folate, TSH, copper, heavy metal screen. 3) ASA   Assessment and plan discussed with with attending physician and they are in agreement.    Felicie Morn PA-C [facility] [phone]  06/03/2013, 9:58 AM

## 2013-06-03 NOTE — Progress Notes (Signed)
[facility] PROGRESS NOTE  [name] ZOX:[id number] DOB: 1935-06-19 DOA: 06/02/2013 PCP: Ricki Rodriguez, MD  Assessment/Plan: 1. Ataxia- MRI/MRA; neuro consult- work up in progress; PT eval- TSH, B12, heavy metals 2. HTN- home meds- with holding parameters 3. HLD- home meds  Code Status: full Family Communication: patient and sons at bedside Disposition Plan: PT eval   Consultants:  neuro  Procedures:    Antibiotics:    HPI/Subjective: Pain in lumbar spine  Objective: Filed Vitals:   06/02/13 2359 06/03/13 0028 06/03/13 0608 06/03/13 0954  BP:  125/77 118/77 103/65  Pulse:  59 66 70  Temp: 97.5 F (36.4 C) 97.6 F (36.4 C) 97.5 F (36.4 C) 97.2 F (36.2 C)  TempSrc:  Oral Oral Oral  Resp:   16 18  Height:  5\' 3"  (1.6 m)    Weight:  65.908 kg (145 lb 4.8 oz)    SpO2:  97% 96% 97%   No intake or output data in the 24 hours ending 06/03/13 1137 Filed Weights   06/03/13 0028  Weight: 65.908 kg (145 lb 4.8 oz)    Exam:   General:  A+Ox3, NAd  Cardiovascular: rrr  Respiratory: clear anterior  Abdomen: +BS, soft, NT  Musculoskeletal: moves all 4 ext   Data Reviewed: Basic Metabolic Panel:  Recent Labs Lab 06/02/13 1955  NA 142  K 4.3  CL 103  CO2 29  GLUCOSE 96  BUN 10  CREATININE 0.74  CALCIUM 9.6   Liver Function Tests: No results found for this basename: AST, ALT, ALKPHOS, BILITOT, PROT, ALBUMIN,  in the last 168 hours No results found for this basename: LIPASE, AMYLASE,  in the last 168 hours No results found for this basename: AMMONIA,  in the last 168 hours CBC:  Recent Labs Lab 06/02/13 1955  WBC 5.7  NEUTROABS 2.5  HGB 14.7  HCT 41.4  MCV 91.0  PLT 170   Cardiac Enzymes: No results found for this basename: CKTOTAL, CKMB, CKMBINDEX, TROPONINI,  in the last 168 hours BNP (last 3 results) No results found for this basename: PROBNP,  in the last 8760 hours CBG:  Recent Labs Lab 06/03/13 0032  GLUCAP 103*     No results found for this or any previous visit (from the past 240 hour(s)).   Studies: Dg Lumbar Spine Complete  06/02/2013   *RADIOLOGY REPORT*  Clinical Data: Fall.  Back pain.  LUMBAR SPINE - COMPLETE 4+ VIEW  Comparison: One-view abdomen 12/10/2011.  CT abdomen pelvis 08/16/2011.  Findings: Bilateral L5 pars defects are again noted. Anterolisthesis at L5-S1 is stable.  Slight degenerative retrolisthesis at L1-2, L2-3, and L3-4 is stable.  Endplate degenerative changes at L1-2 and L2-3 have progressed anteriorly. Minimal atherosclerotic calcifications are evident.  IMPRESSION:  1.  Progressive degenerative endplate changes at L1-2 and L2-3. 2.  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   Original Report Authenticated By: Marin Roberts, M.D.   Ct Head Wo Contrast  06/02/2013   *RADIOLOGY REPORT*  Clinical Data: Fall.  Pain at the top of the head.  CT HEAD WITHOUT CONTRAST  Technique:  Contiguous axial images were obtained from the base of the skull through the vertex without contrast.  Comparison: None.  Findings: No acute cortical infarct, hemorrhage, or mass lesion is present.  The ventricles are of normal size.  Mild periventricular white matter hypoattenuation is evident bilaterally.  No significant extra-axial fluid collection is present.  The paranasal sinuses and mastoid air cells are  clear.  The osseous skull is intact.  IMPRESSION:  1.  Mild atrophy white matter disease is slightly advanced for age. This likely reflects the sequelae of chronic microvascular ischemia. 2.  No acute intracranial abnormality. No evidence for acute trauma.   Original Report Authenticated By: Marin Roberts, M.D.   Ct Lumbar Spine Wo Contrast  06/02/2013   *RADIOLOGY REPORT*  Clinical Data: Fall.  Low back pain.  CT LUMBAR SPINE WITHOUT CONTRAST  Technique:  Multidetector CT imaging of the lumbar spine was performed without intravenous contrast administration. Multiplanar CT  image reconstructions were also generated.  Comparison: Lumbar radiographs from the same day.  CT abdomen pelvis at [facility] 08/16/2011.  Findings: Five non-rib bearing lumbar type vertebral bodies are present.  No acute fracture is evident.  Vertebral body heights are maintained.  Endplate changes at T12-L1 and L1-2 where stable. Bilateral L5 pars defects are again noted.  Anterolisthesis of 7 mm is unchanged.  Right greater than left foraminal stenosis is associated with this anterolisthesis.  Slight retrolisthesis at L1- 2, L2-3, and L3-4 stable.  Atherosclerotic calcifications are present in the aorta without aneurysm.  Two nonobstructing right kidney stones are present.  The right ureter is mildly dilated compared the left.  No obstructing lesions are evident.  The distal ureter is not imaged.  IMPRESSION:  1.  Stable appearance of the lumbar spine. 2.  Bilateral L5 pars defects and associated anterolisthesis. 3.  Degenerative retrolisthesis at L1-2, L2-3, and L3-4. 4.  Mild dilation of the right to ureter and collecting system without a proximal obstructing lesion.  The distal ureter is incompletely imaged. 3.  Two nonobstructing stones in the right kidney measuring 3 mm maximally.   Original Report Authenticated By: Marin Roberts, M.D.   Ct Pelvis Wo Contrast  06/02/2013   *RADIOLOGY REPORT*  Clinical Data: Follow-up with abnormality of the right ureter demonstrated on CT lumbar spine.  CT PELVIS WITHOUT CONTRAST  Technique:  Multidetector CT imaging of the pelvis was performed following the standard protocol without intravenous contrast.  Comparison: CT urogram 08/16/2011.  CT lumbar spine 06/02/2013  Findings: The lower poles of both kidneys are included within the study.  There is a small stone in the lower pole of the right kidney as seen in the previous lumbar spine CT. The intrarenal collecting system is not included. The visualized right ureter is decompressed.  No ureteral stones are  demonstrated.  The left ureter is decompressed without stone identified.  There is a tiny punctate calcification noted in the dependent portion of the bladder which could represent a recently passed stone.  The bladder wall is not thickened.  No other intraluminal filling defects are demonstrated.  There is also calcification in the base of the penis which is stable since the previous CT urogram and likely represent vascular calcification.  Prostate gland is not enlarged.  No free or loculated pelvic fluid collections.  No significant pelvic lymphadenopathy.  No diverticulitis.  The appendix is normal. Degenerative changes in the lower lumbar spine.  Bilateral spondylolysis at L5-S1 with mild spondylolisthesis.  No destructive bone lesions appreciated.  IMPRESSION: Stone in the lower pole right kidney as previously demonstrated. Visualized ureters are decompressed.  No ureteral stones.  Tiny punctate calcification in the dependent bladder could represent a small stone.   Original Report Authenticated By: Burman Nieves, M.D.    Scheduled Meds: . amLODipine  5 mg Oral Daily  . aspirin EC  81 mg Oral Daily  .  heparin  5,000 Units Subcutaneous Q8H  . lidocaine  1 patch Transdermal Daily  . [START ON 06/04/2013] pneumococcal 23 valent vaccine  0.5 mL Intramuscular Tomorrow-1000  . simvastatin  20 mg Oral QHS   Continuous Infusions:   Principal Problem:   Ataxia Active Problems:   Falling   HTN (hypertension)   Hyperlipidemia    Time spent: 35    [facility], Agostino Gorin  [facility] Pager [phone]. If 7PM-7AM, please contact night-coverage at [email or web address], password [facility] 06/03/2013, 11:37 AM  LOS: 1 day

## 2013-06-04 MED ORDER — LIDOCAINE 5 % EX PTCH: 1.0000 | MEDICATED_PATCH | Freq: Every day | CUTANEOUS | Status: DC

## 2013-06-04 MED ORDER — LEVOTHYROXINE SODIUM 25 MCG PO TABS: 25.0000 ug | ORAL_TABLET | Freq: Every day | ORAL | Status: DC

## 2013-06-04 MED ORDER — HYDROCODONE-ACETAMINOPHEN 5-325 MG PO TABS: 1.0000 | ORAL_TABLET | Freq: Four times a day (QID) | ORAL | Status: DC | PRN

## 2013-06-04 NOTE — Progress Notes (Signed)
NEURO HOSPITALIST PROGRESS NOTE   SUBJECTIVE:                                                                                                                        Continues to have bilateral dorsiflexion weakness unchanged from yesterday.   OBJECTIVE:                                                                                                                           Vital signs in last 24 hours: Temp:  [97.6 F (36.4 C)-98 F (36.7 C)] 97.6 F (36.4 C) (07/24 0958) Pulse Rate:  [64-87] 81 (07/24 0958) Resp:  [18] 18 (07/24 0958) BP: (93-117)/(65-75) 114/68 mmHg (07/24 0958) SpO2:  [94 %-99 %] 96 % (07/24 0958)  Intake/Output from previous day:   Intake/Output this shift: Total I/O In: 480 [P.O.:480] Out: -  Nutritional status: Vegetarian  Past Medical History  Diagnosis Date  . Hypertension   . Hyperlipidemia   . Right ureteral stone   . Hematuria     ureteral stone and stent  . Chronic kidney disease     left nephrolithiasis     Neurologic Exam:  Mental Status: Alert, oriented, thought content appropriate.  Speech fluent without evidence of aphasia.  Able to follow 3 step commands without difficulty. Cranial Nerves: II: Discs flat bilaterally; Visual fields grossly normal, pupils equal, round, reactive to light and accommodation III,IV, VI: ptosis not present, extra-ocular motions intact bilaterally V,VII: smile symmetric, facial light touch sensation normal bilaterally VIII: hearing normal bilaterally IX,X: gag reflex present XI: bilateral shoulder shrug XII: midline tongue extension Motor: Right : Upper extremity   5/5    Left:     Upper extremity   5/5  Lower extremity   5/5     Lower extremity   5/5  DF 5/5, PF 4/5      DF 4/5 PF 5/5  Tone and bulk:normal tone throughout; no atrophy noted Sensory: Pinprick and light touch intact throughout, bilaterally Deep Tendon Reflexes:  Right: Upper Extremity   Left:  Upper extremity   biceps (C-5 to C-6) 2/4   biceps (C-5 to C-6) 2/4 tricep (C7) 2/4    triceps (C7) 2/4 Brachioradialis (C6) 2/4  Brachioradialis (  C6) 2/4  Lower Extremity Lower Extremity  quadriceps (L-2 to L-4) 1/4   quadriceps (L-2 to L-4) 1/4 Achilles (S1) 0/4   Achilles (S1) 0/4  Plantars: Mute bilaterally Cerebellar: normal finger-to-nose,  normal heel-to-shin test CV: pulses palpable throughout    Lab Results: Lab Results  Component Value Date/Time   CHOL 156 06/03/2013  5:39 AM   Lipid Panel  Recent Labs  06/03/13 0539  CHOL 156  TRIG 230*  HDL 37*  CHOLHDL 4.2  VLDL 46*  LDLCALC 73    Studies/Results: Dg Lumbar Spine Complete  06/02/2013   *RADIOLOGY REPORT*  Clinical Data: Fall.  Back pain.  LUMBAR SPINE - COMPLETE 4+ VIEW  Comparison: One-view abdomen 12/10/2011.  CT abdomen pelvis 08/16/2011.  Findings: Bilateral L5 pars defects are again noted. Anterolisthesis at L5-S1 is stable.  Slight degenerative retrolisthesis at L1-2, L2-3, and L3-4 is stable.  Endplate degenerative changes at L1-2 and L2-3 have progressed anteriorly. Minimal atherosclerotic calcifications are evident.  IMPRESSION:  1.  Progressive degenerative endplate changes at L1-2 and L2-3. 2.  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   Original Report Authenticated By: Marin Roberts, M.D.   Ct Head Wo Contrast  06/02/2013   *RADIOLOGY REPORT*  Clinical Data: Fall.  Pain at the top of the head.  CT HEAD WITHOUT CONTRAST  Technique:  Contiguous axial images were obtained from the base of the skull through the vertex without contrast.  Comparison: None.  Findings: No acute cortical infarct, hemorrhage, or mass lesion is present.  The ventricles are of normal size.  Mild periventricular white matter hypoattenuation is evident bilaterally.  No significant extra-axial fluid collection is present.  The paranasal sinuses and mastoid air cells are clear.  The osseous skull is intact.   IMPRESSION:  1.  Mild atrophy white matter disease is slightly advanced for age. This likely reflects the sequelae of chronic microvascular ischemia. 2.  No acute intracranial abnormality. No evidence for acute trauma.   Original Report Authenticated By: Marin Roberts, M.D.   Ct Lumbar Spine Wo Contrast  06/02/2013   *RADIOLOGY REPORT*  Clinical Data: Fall.  Low back pain.  CT LUMBAR SPINE WITHOUT CONTRAST  Technique:  Multidetector CT imaging of the lumbar spine was performed without intravenous contrast administration. Multiplanar CT image reconstructions were also generated.  Comparison: Lumbar radiographs from the same day.  CT abdomen pelvis at [facility] 08/16/2011.  Findings: Five non-rib bearing lumbar type vertebral bodies are present.  No acute fracture is evident.  Vertebral body heights are maintained.  Endplate changes at T12-L1 and L1-2 where stable. Bilateral L5 pars defects are again noted.  Anterolisthesis of 7 mm is unchanged.  Right greater than left foraminal stenosis is associated with this anterolisthesis.  Slight retrolisthesis at L1- 2, L2-3, and L3-4 stable.  Atherosclerotic calcifications are present in the aorta without aneurysm.  Two nonobstructing right kidney stones are present.  The right ureter is mildly dilated compared the left.  No obstructing lesions are evident.  The distal ureter is not imaged.  IMPRESSION:  1.  Stable appearance of the lumbar spine. 2.  Bilateral L5 pars defects and associated anterolisthesis. 3.  Degenerative retrolisthesis at L1-2, L2-3, and L3-4. 4.  Mild dilation of the right to ureter and collecting system without a proximal obstructing lesion.  The distal ureter is incompletely imaged. 3.  Two nonobstructing stones in the right kidney measuring 3 mm maximally.   Original Report Authenticated By: Marin Roberts, M.D.  Ct Pelvis Wo Contrast  06/02/2013   *RADIOLOGY REPORT*  Clinical Data: Follow-up with abnormality of the right  ureter demonstrated on CT lumbar spine.  CT PELVIS WITHOUT CONTRAST  Technique:  Multidetector CT imaging of the pelvis was performed following the standard protocol without intravenous contrast.  Comparison: CT urogram 08/16/2011.  CT lumbar spine 06/02/2013  Findings: The lower poles of both kidneys are included within the study.  There is a small stone in the lower pole of the right kidney as seen in the previous lumbar spine CT. The intrarenal collecting system is not included. The visualized right ureter is decompressed.  No ureteral stones are demonstrated.  The left ureter is decompressed without stone identified.  There is a tiny punctate calcification noted in the dependent portion of the bladder which could represent a recently passed stone.  The bladder wall is not thickened.  No other intraluminal filling defects are demonstrated.  There is also calcification in the base of the penis which is stable since the previous CT urogram and likely represent vascular calcification.  Prostate gland is not enlarged.  No free or loculated pelvic fluid collections.  No significant pelvic lymphadenopathy.  No diverticulitis.  The appendix is normal. Degenerative changes in the lower lumbar spine.  Bilateral spondylolysis at L5-S1 with mild spondylolisthesis.  No destructive bone lesions appreciated.  IMPRESSION: Stone in the lower pole right kidney as previously demonstrated. Visualized ureters are decompressed.  No ureteral stones.  Tiny punctate calcification in the dependent bladder could represent a small stone.   Original Report Authenticated By: Burman Nieves, M.D.   [facility] Wo Contrast  06/03/2013   *RADIOLOGY REPORT*  Clinical Data:  Ataxia.  Falling.  Right lower extremity weakness.  MRI HEAD WITHOUT CONTRAST MRA HEAD WITHOUT CONTRAST  Technique:  Multiplanar, multiecho pulse sequences of the brain and surrounding structures were obtained without intravenous contrast. Angiographic images of the head  were obtained using MRA technique without contrast.  Comparison:  Head CT 06/02/2013  MRI HEAD  Findings:  Diffusion imaging does not show any acute or subacute infarction.  The brainstem and cerebellum are unremarkable.  The cerebral hemispheres show mild age related atrophy with mild to moderate chronic small vessel changes affecting the deep white matter.  No cortical or large vessel territory infarction.  No evidence of acute hemorrhage, hydrocephalus or extra-axial collection.  There is a small focus of hemosiderin deposition in the left temporal lobe related to an old small vessel infarction. No pituitary mass.  No fluid in the sinuses, middle ears or mastoids.  No skull or skull base lesion.  IMPRESSION: No acute or reversible findings.  Atrophy.  Mild to moderate chronic small vessel disease of the cerebral hemispheric white matter.  MRA HEAD  Findings: Both internal carotid arteries are widely patent into the brain.  The anterior and middle cerebral vessels are patent without proximal stenosis, aneurysm or vascular malformation.  Both vertebral arteries are patent to the basilar.  No basilar stenosis.  The posterior circulation branch vessels are patent. There are patent posterior communicating arteries bilaterally.  IMPRESSION: Normal intracranial MR angiography of the large and medium-sized vessels.   Original Report Authenticated By: Paulina Fusi, M.D.   Mri Brain Without Contrast  06/03/2013   *RADIOLOGY REPORT*  Clinical Data:  Ataxia.  Falling.  Right lower extremity weakness.  MRI HEAD WITHOUT CONTRAST MRA HEAD WITHOUT CONTRAST  Technique:  Multiplanar, multiecho pulse sequences of the brain and surrounding structures were obtained without  intravenous contrast. Angiographic images of the head were obtained using MRA technique without contrast.  Comparison:  Head CT 06/02/2013  MRI HEAD  Findings:  Diffusion imaging does not show any acute or subacute infarction.  The brainstem and cerebellum are  unremarkable.  The cerebral hemispheres show mild age related atrophy with mild to moderate chronic small vessel changes affecting the deep white matter.  No cortical or large vessel territory infarction.  No evidence of acute hemorrhage, hydrocephalus or extra-axial collection.  There is a small focus of hemosiderin deposition in the left temporal lobe related to an old small vessel infarction. No pituitary mass.  No fluid in the sinuses, middle ears or mastoids.  No skull or skull base lesion.  IMPRESSION: No acute or reversible findings.  Atrophy.  Mild to moderate chronic small vessel disease of the cerebral hemispheric white matter.  MRA HEAD  Findings: Both internal carotid arteries are widely patent into the brain.  The anterior and middle cerebral vessels are patent without proximal stenosis, aneurysm or vascular malformation.  Both vertebral arteries are patent to the basilar.  No basilar stenosis.  The posterior circulation branch vessels are patent. There are patent posterior communicating arteries bilaterally.  IMPRESSION: Normal intracranial MR angiography of the large and medium-sized vessels.   Original Report Authenticated By: Paulina Fusi, M.D.    MEDICATIONS                                                                                                                        Scheduled: . amLODipine  5 mg Oral Daily  . aspirin EC  81 mg Oral Daily  . heparin  5,000 Units Subcutaneous Q8H  . levothyroxine  25 mcg Oral QAC breakfast  . lidocaine  1 patch Transdermal Daily  . simvastatin  20 mg Oral QHS    ASSESSMENT/PLAN:                                                                                                            78 YO male with bilateral DF weakness which is mild.  MRI and CT head show no acute abnormality. CT lumbar spine shows no pathology to explain patients weakness. All labs with exception of heavy metal screen have returned normal. At this time etiology remains  unclear.    Recommend: 1) PT for gait and strengthening 2) if weakness persists would recommend out patient follow up with neurology where further out patient diagnostic may be performed.   Neurology S/O      Assessment and  plan discussed with with attending physician and they are in agreement.    Felicie Morn PA-C [facility] [phone]  06/04/2013, 11:31 AM

## 2013-06-04 NOTE — Discharge Summary (Signed)
Physician Discharge Summary  [name] ZOX:[id number] DOB: 09/04/1935 DOA: 06/02/2013  PCP: Ricki Rodriguez, MD  Admit date: 06/02/2013 Discharge date: 06/04/2013  Time spent: 35 minutes  Recommendations for Outpatient Follow-up:  1. TSH, free T4 in 6 weeks 2. Home health- PT 3. Heavy metal screen pending  Discharge Diagnoses:  Principal Problem:   Ataxia Active Problems:   Falling   HTN (hypertension)   Hyperlipidemia   Discharge Condition: improved  Diet recommendation: cardiac  Filed Weights   06/03/13 0028  Weight: 65.908 kg (145 lb 4.8 oz)    History of present illness:  [name] is an 78 y.o. 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 He also noted his right lower leg to be weak. He had no HA, Vx problems, focal weakness, palpitation, chest pain or shortness of breath. He has some lower back pain, but denied radiation, and bowel or bladder incontincence. Evaluation in the ER included a CT of the LS spine with no acute spinal pathology and CT of the pelvic showed no obtructive stone. His serology was normal as well. Hospitalist was asked to admit him for right lower leg weakness.   Hospital Course:  Hypothyroid- TSH elevated, synthroid started- repeat tests in 6 weeks Ataxia- MRI of brain ok; neuro followed- heavy metal screen pending- PT eval for home health and rolling walker HTN HLD   Consultations:  neuro  Discharge Exam: Filed Vitals:   06/03/13 1900 06/04/13 0121 06/04/13 0553 06/04/13 0958  BP: 104/74 93/65 99/65  114/68  Pulse: 72 65 64 81  Temp: 98 F (36.7 C) 97.8 F (36.6 C) 97.6 F (36.4 C) 97.6 F (36.4 C)  TempSrc: Oral Oral Oral Oral  Resp: 18 18 18 18   Height:      Weight:      SpO2: 96% 99% 98% 96%    General: A+Ox3,  NAD Cardiovascular: rrr Respiratory: clear anterior  Discharge Instructions      Discharge Orders   Future Orders Complete By Expires     Diet - low sodium heart healthy  As directed     Discharge instructions  As directed     Comments:      Home health PT Needs PCP    Increase activity slowly  As directed         Medication List         amLODipine 5 MG tablet  Commonly known as:  NORVASC  Take 5 mg by mouth every morning.     aspirin EC 81 MG tablet  Take 81 mg by mouth daily.     HYDROcodone-acetaminophen 5-325 MG per tablet  Commonly known as:  NORCO/VICODIN  Take 1 tablet by mouth every 6 (six) hours as needed.     levothyroxine 25 MCG tablet  Commonly known as:  SYNTHROID, LEVOTHROID  Take 1 tablet (25 mcg total) by mouth daily before breakfast.     lidocaine 5 %  Commonly known as:  LIDODERM  Place 1 patch onto the skin daily. Remove & Discard patch within 12 hours or as directed by MD     MULTIVITAMIN PO  Take 1 tablet by mouth daily.     simvastatin 20 MG tablet  Commonly known as:  ZOCOR  Take 20 mg by mouth  at bedtime.       No Known Allergies Follow-up Information   Follow up with [facility], MD In 1 week.   Contact information:   [address] [place] [address] [phone]        The results of significant diagnostics from this hospitalization (including imaging, microbiology, ancillary and laboratory) are listed below for reference.    Significant Diagnostic Studies: Dg Lumbar Spine Complete  06/02/2013   *RADIOLOGY REPORT*  Clinical Data: Fall.  Back pain.  LUMBAR SPINE - COMPLETE 4+ VIEW  Comparison: One-view abdomen 12/10/2011.  CT abdomen pelvis 08/16/2011.  Findings: Bilateral L5 pars defects are again noted. Anterolisthesis at L5-S1 is stable.  Slight degenerative retrolisthesis at L1-2, L2-3, and L3-4 is stable.  Endplate degenerative changes at L1-2 and L2-3 have progressed anteriorly. Minimal atherosclerotic  calcifications are evident.  IMPRESSION:  1.  Progressive degenerative endplate changes at L1-2 and L2-3. 2.  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   Original Report Authenticated By: Marin Roberts, M.D.   Ct Head Wo Contrast  06/02/2013   *RADIOLOGY REPORT*  Clinical Data: Fall.  Pain at the top of the head.  CT HEAD WITHOUT CONTRAST  Technique:  Contiguous axial images were obtained from the base of the skull through the vertex without contrast.  Comparison: None.  Findings: No acute cortical infarct, hemorrhage, or mass lesion is present.  The ventricles are of normal size.  Mild periventricular white matter hypoattenuation is evident bilaterally.  No significant extra-axial fluid collection is present.  The paranasal sinuses and mastoid air cells are clear.  The osseous skull is intact.  IMPRESSION:  1.  Mild atrophy white matter disease is slightly advanced for age. This likely reflects the sequelae of chronic microvascular ischemia. 2.  No acute intracranial abnormality. No evidence for acute trauma.   Original Report Authenticated By: Marin Roberts, M.D.   Ct Lumbar Spine Wo Contrast  06/02/2013   *RADIOLOGY REPORT*  Clinical Data: Fall.  Low back pain.  CT LUMBAR SPINE WITHOUT CONTRAST  Technique:  Multidetector CT imaging of the lumbar spine was performed without intravenous contrast administration. Multiplanar CT image reconstructions were also generated.  Comparison: Lumbar radiographs from the same day.  CT abdomen pelvis at [facility] 08/16/2011.  Findings: Five non-rib bearing lumbar type vertebral bodies are present.  No acute fracture is evident.  Vertebral body heights are maintained.  Endplate changes at T12-L1 and L1-2 where stable. Bilateral L5 pars defects are again noted.  Anterolisthesis of 7 mm is unchanged.  Right greater than left foraminal stenosis is associated with this anterolisthesis.  Slight retrolisthesis at L1- 2, L2-3, and L3-4  stable.  Atherosclerotic calcifications are present in the aorta without aneurysm.  Two nonobstructing right kidney stones are present.  The right ureter is mildly dilated compared the left.  No obstructing lesions are evident.  The distal ureter is not imaged.  IMPRESSION:  1.  Stable appearance of the lumbar spine. 2.  Bilateral L5 pars defects and associated anterolisthesis. 3.  Degenerative retrolisthesis at L1-2, L2-3, and L3-4. 4.  Mild dilation of the right to ureter and collecting system without a proximal obstructing lesion.  The distal ureter is incompletely imaged. 3.  Two nonobstructing stones in the right kidney measuring 3 mm maximally.   Original Report Authenticated By: Marin Roberts, M.D.   Ct Pelvis Wo Contrast  06/02/2013   *RADIOLOGY REPORT*  Clinical Data: Follow-up with abnormality of the right ureter demonstrated  on CT lumbar spine.  CT PELVIS WITHOUT CONTRAST  Technique:  Multidetector CT imaging of the pelvis was performed following the standard protocol without intravenous contrast.  Comparison: CT urogram 08/16/2011.  CT lumbar spine 06/02/2013  Findings: The lower poles of both kidneys are included within the study.  There is a small stone in the lower pole of the right kidney as seen in the previous lumbar spine CT. The intrarenal collecting system is not included. The visualized right ureter is decompressed.  No ureteral stones are demonstrated.  The left ureter is decompressed without stone identified.  There is a tiny punctate calcification noted in the dependent portion of the bladder which could represent a recently passed stone.  The bladder wall is not thickened.  No other intraluminal filling defects are demonstrated.  There is also calcification in the base of the penis which is stable since the previous CT urogram and likely represent vascular calcification.  Prostate gland is not enlarged.  No free or loculated pelvic fluid collections.  No significant pelvic  lymphadenopathy.  No diverticulitis.  The appendix is normal. Degenerative changes in the lower lumbar spine.  Bilateral spondylolysis at L5-S1 with mild spondylolisthesis.  No destructive bone lesions appreciated.  IMPRESSION: Stone in the lower pole right kidney as previously demonstrated. Visualized ureters are decompressed.  No ureteral stones.  Tiny punctate calcification in the dependent bladder could represent a small stone.   Original Report Authenticated By: Burman Nieves, M.D.   Mr [facility] Wo Contrast  06/03/2013   *RADIOLOGY REPORT*  Clinical Data:  Ataxia.  Falling.  Right lower extremity weakness.  MRI HEAD WITHOUT CONTRAST MRA HEAD WITHOUT CONTRAST  Technique:  Multiplanar, multiecho pulse sequences of the brain and surrounding structures were obtained without intravenous contrast. Angiographic images of the head were obtained using MRA technique without contrast.  Comparison:  Head CT 06/02/2013  MRI HEAD  Findings:  Diffusion imaging does not show any acute or subacute infarction.  The brainstem and cerebellum are unremarkable.  The cerebral hemispheres show mild age related atrophy with mild to moderate chronic small vessel changes affecting the deep white matter.  No cortical or large vessel territory infarction.  No evidence of acute hemorrhage, hydrocephalus or extra-axial collection.  There is a small focus of hemosiderin deposition in the left temporal lobe related to an old small vessel infarction. No pituitary mass.  No fluid in the sinuses, middle ears or mastoids.  No skull or skull base lesion.  IMPRESSION: No acute or reversible findings.  Atrophy.  Mild to moderate chronic small vessel disease of the cerebral hemispheric white matter.  MRA HEAD  Findings: Both internal carotid arteries are widely patent into the brain.  The anterior and middle cerebral vessels are patent without proximal stenosis, aneurysm or vascular malformation.  Both vertebral arteries are patent to the  basilar.  No basilar stenosis.  The posterior circulation branch vessels are patent. There are patent posterior communicating arteries bilaterally.  IMPRESSION: Normal intracranial MR angiography of the large and medium-sized vessels.   Original Report Authenticated By: Paulina Fusi, M.D.   Mri Brain Without Contrast  06/03/2013   *RADIOLOGY REPORT*  Clinical Data:  Ataxia.  Falling.  Right lower extremity weakness.  MRI HEAD WITHOUT CONTRAST MRA HEAD WITHOUT CONTRAST  Technique:  Multiplanar, multiecho pulse sequences of the brain and surrounding structures were obtained without intravenous contrast. Angiographic images of the head were obtained using MRA technique without contrast.  Comparison:  Head CT 06/02/2013  MRI HEAD  Findings:  Diffusion imaging does not show any acute or subacute infarction.  The brainstem and cerebellum are unremarkable.  The cerebral hemispheres show mild age related atrophy with mild to moderate chronic small vessel changes affecting the deep white matter.  No cortical or large vessel territory infarction.  No evidence of acute hemorrhage, hydrocephalus or extra-axial collection.  There is a small focus of hemosiderin deposition in the left temporal lobe related to an old small vessel infarction. No pituitary mass.  No fluid in the sinuses, middle ears or mastoids.  No skull or skull base lesion.  IMPRESSION: No acute or reversible findings.  Atrophy.  Mild to moderate chronic small vessel disease of the cerebral hemispheric white matter.  MRA HEAD  Findings: Both internal carotid arteries are widely patent into the brain.  The anterior and middle cerebral vessels are patent without proximal stenosis, aneurysm or vascular malformation.  Both vertebral arteries are patent to the basilar.  No basilar stenosis.  The posterior circulation branch vessels are patent. There are patent posterior communicating arteries bilaterally.  IMPRESSION: Normal intracranial MR angiography of the large  and medium-sized vessels.   Original Report Authenticated By: Paulina Fusi, M.D.    Microbiology: No results found for this or any previous visit (from the past 240 hour(s)).   Labs: Basic Metabolic Panel:  Recent Labs Lab 06/02/13 1955  NA 142  K 4.3  CL 103  CO2 29  GLUCOSE 96  BUN 10  CREATININE 0.74  CALCIUM 9.6   Liver Function Tests: No results found for this basename: AST, ALT, ALKPHOS, BILITOT, PROT, ALBUMIN,  in the last 168 hours No results found for this basename: LIPASE, AMYLASE,  in the last 168 hours No results found for this basename: AMMONIA,  in the last 168 hours CBC:  Recent Labs Lab 06/02/13 1955  WBC 5.7  NEUTROABS 2.5  HGB 14.7  HCT 41.4  MCV 91.0  PLT 170   Cardiac Enzymes: No results found for this basename: CKTOTAL, CKMB, CKMBINDEX, TROPONINI,  in the last 168 hours BNP: BNP (last 3 results) No results found for this basename: PROBNP,  in the last 8760 hours CBG:  Recent Labs Lab 06/03/13 0032  GLUCAP 103*       Signed:  Janila Arrazola  [facility] 06/04/2013, 12:09 PM

## 2013-06-04 NOTE — Evaluation (Signed)
Physical Therapy Evaluation Patient Details Name: [name] MRN: [id number] DOB: 05/02/1935 Today's Date: 06/04/2013 Time: 7829-5621 PT Time Calculation (min): 28 min  PT Assessment / Plan / Recommendation History of Present Illness  [name] at work recently and per family report has had leg weakness, great toe/?foot numbness on the right and some back pain since the fall, not before.[name]  MRI and other testing except thyroid tests have been essentially negative.  Clinical Impression  Pt admitted with with R LE weakness, toe numbness, back pain and gait disturbance post fall. Pt currently with functional limitations due to the deficits listed below (see PT Problem List).  Pt will benefit from HHPT to increase their independence and safety with mobility and DME to be safe in the care of his family.     PT Assessment  All further PT needs can be met in the next venue of care    Follow Up Recommendations  Home health PT;Supervision for mobility/OOB    Does the patient have the potential to tolerate intense rehabilitation      Barriers to Discharge        Equipment Recommendations  Rolling walker with 5" wheels    Recommendations for Other Services     Frequency      Precautions / Restrictions Precautions Precautions: Fall Restrictions Weight Bearing Restrictions: No   Pertinent Vitals/Pain       Mobility  Bed Mobility Bed Mobility: Supine to Sit;Sitting - Scoot to Edge of Bed Supine to Sit: 7: Independent Sitting - Scoot to Edge of Bed: 7: Independent Transfers Transfers: Sit to Stand;Stand to Sit Sit to Stand: 4: Min guard;From bed;From chair/3-in-1 Stand to Sit: 5: Supervision;With upper extremity assist;To bed;To chair/3-in-1 Details for Transfer Assistance: unsteady with list off to right immediately upon first stand; less instability after first stand. Ambulation/Gait Ambulation/Gait Assistance: 4: Min assist Ambulation Distance (Feet): 80 Feet (times  2) Assistive device: Rolling walker;None Ambulation/Gait Assistance Details: ataxic gait on Right with foot flap after heel contact; moderated somewhat with the use of the RW. Gait Pattern: Step-through pattern Gait velocity: moderate speed Stairs: Yes Stairs Assistance: 4: Min assist;4: Min guard Stairs Assistance Details (indicate cue type and reason): uncoordinated and jarring due to weaknesses around R foot and ankle Stair Management Technique: One rail Right;Forwards;Step to pattern;Alternating pattern Number of Stairs: 7 Wheelchair Mobility Wheelchair Mobility: No    Exercises     PT Diagnosis:    PT Problem List: Decreased strength;Decreased activity tolerance;Decreased balance;Decreased mobility;Decreased coordination;Decreased knowledge of use of DME;Decreased knowledge of precautions PT Treatment Interventions:       PT Goals(Current goals can be found in the care plan section)    Visit Information  Last PT Received On: 06/04/13 Assistance Needed: +1 History of Present Illness: [name] at work recently and per family report has had leg weakness, great toe/?foot numbness on the right and some back pain since the fall, not before.[name]  MRI and other testing except thyroid tests have been essentially negative.       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 Communication Communication: Prefers language other than English    Cognition  Cognition Arousal/Alertness: Awake/alert Behavior During Therapy: WFL for tasks assessed/performed Overall Cognitive Status: Within Functional  Limits for tasks assessed    Extremity/Trunk  Assessment Lower Extremity Assessment Lower Extremity Assessment: RLE deficits/detail RLE Deficits / Details: hip flexors 4/5, quads 4+/5, hams 4/5 df/pf 2+/5, numbness Great toe Cervical / Trunk Assessment Cervical / Trunk Assessment: Normal   Balance Balance Balance Assessed: Yes Static Sitting Balance Static Sitting - Balance Support: Feet supported Static Sitting - Level of Assistance: 7: Independent Static Standing Balance Static Standing - Balance Support: No upper extremity supported;Right upper extremity supported;Left upper extremity supported Static Standing - Level of Assistance: 5: Stand by assistance High Level Balance High Level Balance Activites: Backward walking;Direction changes;Turns;Sudden stops;Head turns High Level Balance Comments: produced increased instability without LOB; pt much more guard if had no RW  End of Session PT - End of Session Activity Tolerance: Patient tolerated treatment well Patient left: in bed;with call bell/phone within reach;with family/visitor present Nurse Communication: Mobility status  GP     Laina Guerrieri, Eliseo Gum 06/04/2013, 12:26 PM 06/04/2013  [place], PT [phone]  (pager)

## 2013-06-04 NOTE — Progress Notes (Signed)
Talked to patient with son present about discharge planning; patient has a PCP but would like to change physicians. Patient has private insurance with [facility] and can see any physician of their choice. Information given to patient on the [facility] physician and [facility]; patient plans to follow up at [facility] until they decide where on another PCP; Home health care choices offered, patient/son chose [facility]; Mary with [facility] called for arrangements; B Shelba Flake

## 2013-06-06 LAB — CERULOPLASMIN: Ceruloplasmin: 22 mg/dL (ref 20–60)

## 2013-06-07 LAB — HEAVY METALS SCREEN, URINE: Mercury, 24H Ur: <2 mcg/L (ref <21)

## 2015-02-12 ENCOUNTER — Ambulatory Visit (INDEPENDENT_AMBULATORY_CARE_PROVIDER_SITE_OTHER): Payer: Medicare Other | Admitting: Family Medicine

## 2015-02-12 ENCOUNTER — Encounter (HOSPITAL_COMMUNITY): Payer: Self-pay | Admitting: Cardiology

## 2015-02-12 ENCOUNTER — Emergency Department (HOSPITAL_COMMUNITY): Payer: Medicare Other | Admitting: Anesthesiology

## 2015-02-12 ENCOUNTER — Inpatient Hospital Stay (HOSPITAL_COMMUNITY)
Admission: EM | Admit: 2015-02-12 | Discharge: 2015-02-18 | DRG: 087 | Disposition: A | Discharge status: Skilled Nursing Facility | Payer: Medicare Other | Attending: Neurosurgery | Admitting: Neurosurgery

## 2015-02-12 ENCOUNTER — Emergency Department (HOSPITAL_COMMUNITY): Payer: Medicare Other

## 2015-02-12 ENCOUNTER — Encounter (HOSPITAL_COMMUNITY)
Admission: EM | Disposition: A | Discharge status: Skilled Nursing Facility | Payer: Self-pay | Source: Home / Self Care | Attending: Neurosurgery

## 2015-02-12 VITALS — BP 132/98 | HR 114 | Temp 97.8°F | Resp 24

## 2015-02-12 DIAGNOSIS — Z79899 Other long term (current) drug therapy: Secondary | ICD-10-CM | POA: Diagnosis not present

## 2015-02-12 DIAGNOSIS — E785 Hyperlipidemia, unspecified: Secondary | ICD-10-CM | POA: Diagnosis present

## 2015-02-12 DIAGNOSIS — R270 Ataxia, unspecified: Secondary | ICD-10-CM | POA: Diagnosis present

## 2015-02-12 DIAGNOSIS — S065X9A Traumatic subdural hemorrhage with loss of consciousness of unspecified duration, initial encounter: Secondary | ICD-10-CM

## 2015-02-12 DIAGNOSIS — S065XAA Traumatic subdural hemorrhage with loss of consciousness status unknown, initial encounter: Secondary | ICD-10-CM

## 2015-02-12 DIAGNOSIS — S065X0A Traumatic subdural hemorrhage without loss of consciousness, initial encounter: Secondary | ICD-10-CM | POA: Diagnosis present

## 2015-02-12 DIAGNOSIS — I10 Essential (primary) hypertension: Secondary | ICD-10-CM | POA: Diagnosis present

## 2015-02-12 DIAGNOSIS — W19XXXA Unspecified fall, initial encounter: Secondary | ICD-10-CM | POA: Diagnosis not present

## 2015-02-12 DIAGNOSIS — Z7982 Long term (current) use of aspirin: Secondary | ICD-10-CM | POA: Diagnosis not present

## 2015-02-12 DIAGNOSIS — S065X4S Traumatic subdural hemorrhage with loss of consciousness of 6 hours to 24 hours, sequela: Secondary | ICD-10-CM | POA: Diagnosis not present

## 2015-02-12 DIAGNOSIS — I6200 Nontraumatic subdural hemorrhage, unspecified: Secondary | ICD-10-CM | POA: Diagnosis present

## 2015-02-12 DIAGNOSIS — IMO0001 Reserved for inherently not codable concepts without codable children: Secondary | ICD-10-CM

## 2015-02-12 HISTORY — DX: Traumatic subdural hemorrhage with loss of consciousness status unknown, initial encounter: S06.5XAA

## 2015-02-12 HISTORY — PX: CRANIOTOMY: SHX93

## 2015-02-12 LAB — APTT: aPTT: 34 seconds (ref 24–37)

## 2015-02-12 LAB — COMPREHENSIVE METABOLIC PANEL
ALT: 23 U/L (ref 0–53)
AST: 28 U/L (ref 0–37)
Albumin: 4.5 g/dL (ref 3.5–5.2)
Alkaline Phosphatase: 36 U/L — ABNORMAL LOW (ref 39–117)
Anion gap: 15 (ref 5–15)
BUN: 9 mg/dL (ref 6–23)
CO2: 23 mmol/L (ref 19–32)
Calcium: 9.7 mg/dL (ref 8.4–10.5)
Chloride: 102 mmol/L (ref 96–112)
Creatinine, Ser: 0.82 mg/dL (ref 0.50–1.35)
GFR calc Af Amer: >90 mL/min (ref >90)
GFR calc non Af Amer: 83 mL/min — ABNORMAL LOW (ref >90)
Glucose, Bld: 121 mg/dL — ABNORMAL HIGH (ref 70–99)
Potassium: 4.5 mmol/L (ref 3.5–5.1)
Sodium: 140 mmol/L (ref 135–145)
Total Bilirubin: 1 mg/dL (ref 0.3–1.2)
Total Protein: 7.8 g/dL (ref 6.0–8.3)

## 2015-02-12 LAB — DIFFERENTIAL
Basophils Absolute: 0 10*3/uL (ref 0.0–0.1)
Basophils Relative: 1 % (ref 0–1)
Eosinophils Absolute: 0.1 10*3/uL (ref 0.0–0.7)
Eosinophils Relative: 1 % (ref 0–5)
Lymphocytes Relative: 34 % (ref 12–46)
Lymphs Abs: 2.9 10*3/uL (ref 0.7–4.0)
Monocytes Absolute: 0.7 10*3/uL (ref 0.1–1.0)
Monocytes Relative: 8 % (ref 3–12)
Neutro Abs: 4.8 10*3/uL (ref 1.7–7.7)
Neutrophils Relative %: 56 % (ref 43–77)

## 2015-02-12 LAB — ETHANOL: Alcohol, Ethyl (B): <5 mg/dL (ref 0–9)

## 2015-02-12 LAB — URINALYSIS, ROUTINE W REFLEX MICROSCOPIC
Bilirubin Urine: NEGATIVE
Glucose, UA: NEGATIVE mg/dL
Hgb urine dipstick: NEGATIVE
Ketones, ur: NEGATIVE mg/dL
Leukocytes, UA: NEGATIVE
Nitrite: NEGATIVE
Protein, ur: NEGATIVE mg/dL
Specific Gravity, Urine: 1.006 (ref 1.005–1.030)
Urobilinogen, UA: 0.2 mg/dL (ref 0.0–1.0)
pH: 8 (ref 5.0–8.0)

## 2015-02-12 LAB — CBC
HCT: 44.1 % (ref 39.0–52.0)
Hemoglobin: 15.1 g/dL (ref 13.0–17.0)
MCH: 31.6 pg (ref 26.0–34.0)
MCHC: 34.2 g/dL (ref 30.0–36.0)
MCV: 92.3 fL (ref 78.0–100.0)
Platelets: 200 10*3/uL (ref 150–400)
RBC: 4.78 MIL/uL (ref 4.22–5.81)
RDW: 12.6 % (ref 11.5–15.5)
WBC: 8.5 10*3/uL (ref 4.0–10.5)

## 2015-02-12 LAB — RAPID URINE DRUG SCREEN, HOSP PERFORMED
Amphetamines: NOT DETECTED
Barbiturates: NOT DETECTED
Benzodiazepines: NOT DETECTED
Cocaine: NOT DETECTED
Opiates: NOT DETECTED
Tetrahydrocannabinol: NOT DETECTED

## 2015-02-12 LAB — I-STAT CHEM 8, ED
BUN: 11 mg/dL (ref 6–23)
Calcium, Ion: 1.11 mmol/L — ABNORMAL LOW (ref 1.13–1.30)
Chloride: 102 mmol/L (ref 96–112)
Creatinine, Ser: 0.7 mg/dL (ref 0.50–1.35)
Glucose, Bld: 127 mg/dL — ABNORMAL HIGH (ref 70–99)
HCT: 48 % (ref 39.0–52.0)
Hemoglobin: 16.3 g/dL (ref 13.0–17.0)
Potassium: 4.4 mmol/L (ref 3.5–5.1)
Sodium: 140 mmol/L (ref 135–145)
TCO2: 22 mmol/L (ref 0–100)

## 2015-02-12 LAB — PROTIME-INR
INR: 1.13 (ref 0.00–1.49)
Prothrombin Time: 14.7 seconds (ref 11.6–15.2)

## 2015-02-12 LAB — I-STAT TROPONIN, ED: Troponin i, poc: 0 ng/mL (ref 0.00–0.08)

## 2015-02-12 SURGERY — CRANIOTOMY HEMATOMA EVACUATION SUBDURAL
Anesthesia: General | Site: Head | Laterality: Right

## 2015-02-12 MED ORDER — ONDANSETRON HCL 4 MG/2ML IJ SOLN
INTRAMUSCULAR | Status: DC | PRN
  Administered 2015-02-12: 4 mg via INTRAVENOUS

## 2015-02-12 MED ORDER — PHENYLEPHRINE HCL 10 MG/ML IJ SOLN
10.0000 mg | INTRAVENOUS | Status: DC | PRN
  Administered 2015-02-12: 50 ug/min via INTRAVENOUS

## 2015-02-12 MED ORDER — NEOSTIGMINE METHYLSULFATE 10 MG/10ML IV SOLN
INTRAVENOUS | Status: AC
  Filled 2015-02-12: qty 1

## 2015-02-12 MED ORDER — THROMBIN 20000 UNITS EX SOLR
CUTANEOUS | Status: DC | PRN
  Administered 2015-02-12: 20 mL via TOPICAL

## 2015-02-12 MED ORDER — SUCCINYLCHOLINE CHLORIDE 20 MG/ML IJ SOLN
INTRAMUSCULAR | Status: AC
  Filled 2015-02-12: qty 1

## 2015-02-12 MED ORDER — HYDROMORPHONE HCL 1 MG/ML IJ SOLN
INTRAMUSCULAR | Status: AC
  Administered 2015-02-12: 22:00:00 via INTRAVENOUS
  Filled 2015-02-12: qty 1

## 2015-02-12 MED ORDER — ONDANSETRON HCL 4 MG/2ML IJ SOLN: 4.0000 mg | Freq: Four times a day (QID) | INTRAMUSCULAR | Status: DC | PRN

## 2015-02-12 MED ORDER — SODIUM CHLORIDE 0.9 % IV SOLN
500.0000 mg | Freq: Two times a day (BID) | INTRAVENOUS | Status: DC
  Administered 2015-02-12 – 2015-02-17 (×11): 500 mg via INTRAVENOUS
  Filled 2015-02-12 (×13): qty 5

## 2015-02-12 MED ORDER — SODIUM CHLORIDE 0.9 % IJ SOLN
INTRAMUSCULAR | Status: AC
  Filled 2015-02-12: qty 10

## 2015-02-12 MED ORDER — LORAZEPAM 2 MG/ML IJ SOLN
INTRAMUSCULAR | Status: AC
  Administered 2015-02-12: 1 mg via INTRAVENOUS
  Filled 2015-02-12: qty 1

## 2015-02-12 MED ORDER — ONDANSETRON HCL 4 MG/2ML IJ SOLN: 4.0000 mg | INTRAMUSCULAR | Status: DC | PRN

## 2015-02-12 MED ORDER — SODIUM CHLORIDE 0.9 % IV SOLN
INTRAVENOUS | Status: DC | PRN
  Administered 2015-02-12 (×2): via INTRAVENOUS

## 2015-02-12 MED ORDER — LIDOCAINE HCL (CARDIAC) 20 MG/ML IV SOLN
INTRAVENOUS | Status: DC | PRN
  Administered 2015-02-12: 60 mg via INTRAVENOUS

## 2015-02-12 MED ORDER — CEFAZOLIN SODIUM-DEXTROSE 2-3 GM-% IV SOLR
INTRAVENOUS | Status: AC
  Filled 2015-02-12: qty 50

## 2015-02-12 MED ORDER — ONDANSETRON HCL 4 MG/2ML IJ SOLN
INTRAMUSCULAR | Status: AC
  Filled 2015-02-12: qty 2

## 2015-02-12 MED ORDER — ROCURONIUM BROMIDE 50 MG/5ML IV SOLN
INTRAVENOUS | Status: AC
  Filled 2015-02-12: qty 1

## 2015-02-12 MED ORDER — LORAZEPAM 2 MG/ML IJ SOLN
1.0000 mg | Freq: Once | INTRAMUSCULAR | Status: AC
  Administered 2015-02-12: 1 mg via INTRAVENOUS

## 2015-02-12 MED ORDER — 0.9 % SODIUM CHLORIDE (POUR BTL) OPTIME
TOPICAL | Status: DC | PRN
  Administered 2015-02-12 (×2): 1000 mL

## 2015-02-12 MED ORDER — MICROFIBRILLAR COLL HEMOSTAT EX PADS
MEDICATED_PAD | CUTANEOUS | Status: DC | PRN
  Administered 2015-02-12: 1 via TOPICAL

## 2015-02-12 MED ORDER — LIDOCAINE-EPINEPHRINE 1 %-1:100000 IJ SOLN
INTRAMUSCULAR | Status: DC | PRN
  Administered 2015-02-12: 4 mL via INTRADERMAL

## 2015-02-12 MED ORDER — SODIUM CHLORIDE 0.9 % IV SOLN
INTRAVENOUS | Status: DC
  Administered 2015-02-12: 22:00:00 via INTRAVENOUS
  Administered 2015-02-13: 1000 mL via INTRAVENOUS
  Administered 2015-02-14 – 2015-02-18 (×4): via INTRAVENOUS

## 2015-02-12 MED ORDER — EPHEDRINE SULFATE 50 MG/ML IJ SOLN
INTRAMUSCULAR | Status: AC
  Filled 2015-02-12: qty 1

## 2015-02-12 MED ORDER — FENTANYL CITRATE 0.05 MG/ML IJ SOLN
INTRAMUSCULAR | Status: AC
  Filled 2015-02-12: qty 5

## 2015-02-12 MED ORDER — FENTANYL CITRATE 0.05 MG/ML IJ SOLN
INTRAMUSCULAR | Status: DC | PRN
  Administered 2015-02-12: 100 ug via INTRAVENOUS

## 2015-02-12 MED ORDER — GLYCOPYRROLATE 0.2 MG/ML IJ SOLN
INTRAMUSCULAR | Status: AC
  Filled 2015-02-12: qty 3

## 2015-02-12 MED ORDER — PROPOFOL 10 MG/ML IV BOLUS
INTRAVENOUS | Status: DC | PRN
  Administered 2015-02-12: 150 mg via INTRAVENOUS

## 2015-02-12 MED ORDER — PROPOFOL 10 MG/ML IV BOLUS
INTRAVENOUS | Status: AC
  Filled 2015-02-12: qty 20

## 2015-02-12 MED ORDER — LIDOCAINE HCL (CARDIAC) 20 MG/ML IV SOLN
INTRAVENOUS | Status: AC
  Filled 2015-02-12: qty 10

## 2015-02-12 MED ORDER — PANTOPRAZOLE SODIUM 40 MG IV SOLR
40.0000 mg | Freq: Every day | INTRAVENOUS | Status: DC
  Administered 2015-02-12 – 2015-02-15 (×4): 40 mg via INTRAVENOUS
  Filled 2015-02-12 (×5): qty 40

## 2015-02-12 MED ORDER — LABETALOL HCL 5 MG/ML IV SOLN: 10.0000 mg | INTRAVENOUS | Status: DC | PRN

## 2015-02-12 MED ORDER — CEFAZOLIN SODIUM-DEXTROSE 2-3 GM-% IV SOLR
INTRAVENOUS | Status: DC | PRN
  Administered 2015-02-12: 2 g via INTRAVENOUS

## 2015-02-12 MED ORDER — CEFAZOLIN SODIUM 1-5 GM-% IV SOLN
1.0000 g | Freq: Three times a day (TID) | INTRAVENOUS | Status: AC
  Administered 2015-02-13 (×2): 1 g via INTRAVENOUS
  Filled 2015-02-12 (×2): qty 50

## 2015-02-12 MED ORDER — GLYCOPYRROLATE 0.2 MG/ML IJ SOLN
INTRAMUSCULAR | Status: DC | PRN
  Administered 2015-02-12: 0.6 mg via INTRAVENOUS

## 2015-02-12 MED ORDER — OXYCODONE HCL 5 MG/5ML PO SOLN: 5.0000 mg | Freq: Once | ORAL | Status: AC | PRN

## 2015-02-12 MED ORDER — FENTANYL CITRATE 0.05 MG/ML IJ SOLN
INTRAMUSCULAR | Status: AC
  Filled 2015-02-12: qty 2

## 2015-02-12 MED ORDER — NEOSTIGMINE METHYLSULFATE 10 MG/10ML IV SOLN
INTRAVENOUS | Status: DC | PRN
  Administered 2015-02-12: 4 mg via INTRAVENOUS

## 2015-02-12 MED ORDER — FENTANYL CITRATE 0.05 MG/ML IJ SOLN
25.0000 ug | INTRAMUSCULAR | Status: DC | PRN
  Administered 2015-02-12 – 2015-02-14 (×4): 25 ug via INTRAVENOUS
  Filled 2015-02-12 (×2): qty 2

## 2015-02-12 MED ORDER — OXYCODONE HCL 5 MG PO TABS: 5.0000 mg | ORAL_TABLET | Freq: Once | ORAL | Status: AC | PRN

## 2015-02-12 MED ORDER — LIDOCAINE HCL (CARDIAC) 20 MG/ML IV SOLN
INTRAVENOUS | Status: AC
  Filled 2015-02-12: qty 5

## 2015-02-12 MED ORDER — ROCURONIUM BROMIDE 100 MG/10ML IV SOLN
INTRAVENOUS | Status: DC | PRN
  Administered 2015-02-12: 30 mg via INTRAVENOUS

## 2015-02-12 MED ORDER — ONDANSETRON HCL 4 MG PO TABS: 4.0000 mg | ORAL_TABLET | ORAL | Status: DC | PRN

## 2015-02-12 MED ORDER — PROMETHAZINE HCL 25 MG PO TABS: 12.5000 mg | ORAL_TABLET | ORAL | Status: DC | PRN

## 2015-02-12 SURGICAL SUPPLY — 58 items
BANDAGE GAUZE 4  KLING STR (GAUZE/BANDAGES/DRESSINGS) IMPLANT
BIT DRILL WIRE PASS 1.3MM (BIT) IMPLANT
BLADE CLIPPER SURG (BLADE) IMPLANT
BRUSH SCRUB EZ PLAIN DRY (MISCELLANEOUS) ×3 IMPLANT
BUR ACORN 6.0 PRECISION (BURR) ×2 IMPLANT
BUR ACORN 6.0MM PRECISION (BURR) ×1
BUR ROUTER D-58 CRANI (BURR) IMPLANT
CANISTER SUCT 3000ML PPV (MISCELLANEOUS) ×3 IMPLANT
CONT SPEC 4OZ CLIKSEAL STRL BL (MISCELLANEOUS) ×3 IMPLANT
DRAIN SNY WOU 7FLT (WOUND CARE) IMPLANT
DRAPE SURG IRRIG POUCH 19X23 (DRAPES) IMPLANT
DRAPE WARM FLUID 44X44 (DRAPE) ×3 IMPLANT
DRILL WIRE PASS 1.3MM (BIT)
DRSG PAD ABDOMINAL 8X10 ST (GAUZE/BANDAGES/DRESSINGS) IMPLANT
DURAPREP 6ML APPLICATOR 50/CS (WOUND CARE) ×3 IMPLANT
ELECT CAUTERY BLADE 6.4 (BLADE) ×3 IMPLANT
ELECT REM PT RETURN 9FT ADLT (ELECTROSURGICAL) ×3
ELECTRODE REM PT RTRN 9FT ADLT (ELECTROSURGICAL) ×1 IMPLANT
EVACUATOR 1/8 PVC DRAIN (DRAIN) IMPLANT
EVACUATOR SILICONE 100CC (DRAIN) IMPLANT
GAUZE SPONGE 4X4 12PLY STRL (GAUZE/BANDAGES/DRESSINGS) ×3 IMPLANT
GAUZE SPONGE 4X4 16PLY XRAY LF (GAUZE/BANDAGES/DRESSINGS) IMPLANT
GLOVE BIO SURGEON STRL SZ 6.5 (GLOVE) ×2 IMPLANT
GLOVE BIO SURGEON STRL SZ7 (GLOVE) ×3 IMPLANT
GLOVE BIO SURGEONS STRL SZ 6.5 (GLOVE) ×1
GLOVE BIOGEL M 8.0 STRL (GLOVE) ×3 IMPLANT
GLOVE EXAM NITRILE LRG STRL (GLOVE) IMPLANT
GLOVE EXAM NITRILE MD LF STRL (GLOVE) IMPLANT
GLOVE EXAM NITRILE XL STR (GLOVE) IMPLANT
GLOVE EXAM NITRILE XS STR PU (GLOVE) IMPLANT
GOWN STRL REUS W/ TWL LRG LVL3 (GOWN DISPOSABLE) ×1 IMPLANT
GOWN STRL REUS W/ TWL XL LVL3 (GOWN DISPOSABLE) IMPLANT
GOWN STRL REUS W/TWL 2XL LVL3 (GOWN DISPOSABLE) IMPLANT
GOWN STRL REUS W/TWL LRG LVL3 (GOWN DISPOSABLE) ×2
GOWN STRL REUS W/TWL XL LVL3 (GOWN DISPOSABLE)
HEMOSTAT SURGICEL 2X14 (HEMOSTASIS) ×3 IMPLANT
KIT BASIN OR (CUSTOM PROCEDURE TRAY) ×3 IMPLANT
KIT ROOM TURNOVER OR (KITS) ×3 IMPLANT
NS IRRIG 1000ML POUR BTL (IV SOLUTION) ×3 IMPLANT
PACK CRANIOTOMY (CUSTOM PROCEDURE TRAY) ×3 IMPLANT
PAD ARMBOARD 7.5X6 YLW CONV (MISCELLANEOUS) ×3 IMPLANT
PATTIES SURGICAL .5 X.5 (GAUZE/BANDAGES/DRESSINGS) IMPLANT
PATTIES SURGICAL .5 X3 (DISPOSABLE) IMPLANT
PATTIES SURGICAL 1X1 (DISPOSABLE) IMPLANT
PIN MAYFIELD SKULL DISP (PIN) IMPLANT
SPONGE NEURO XRAY DETECT 1X3 (DISPOSABLE) IMPLANT
SPONGE SURGIFOAM ABS GEL 100 (HEMOSTASIS) ×3 IMPLANT
STAPLER SKIN PROX WIDE 3.9 (STAPLE) ×3 IMPLANT
SUT NURALON 4 0 TR CR/8 (SUTURE) ×6 IMPLANT
SUT VIC AB 2-0 CP2 18 (SUTURE) ×6 IMPLANT
TAPE CLOTH SURG 4X10 WHT LF (GAUZE/BANDAGES/DRESSINGS) ×3 IMPLANT
TOWEL OR 17X24 6PK STRL BLUE (TOWEL DISPOSABLE) ×3 IMPLANT
TOWEL OR 17X26 10 PK STRL BLUE (TOWEL DISPOSABLE) ×3 IMPLANT
TRAY FOLEY CATH 14FRSI W/METER (CATHETERS) IMPLANT
TUBE CONNECTING 12'X1/4 (SUCTIONS) ×1
TUBE CONNECTING 12X1/4 (SUCTIONS) ×2 IMPLANT
UNDERPAD 30X30 INCONTINENT (UNDERPADS AND DIAPERS) IMPLANT
WATER STERILE IRR 1000ML POUR (IV SOLUTION) ×3 IMPLANT

## 2015-02-12 NOTE — Anesthesia Preprocedure Evaluation (Signed)
Anesthesia Evaluation  Patient identified by MRN, date of birth, ID band Patient awake    Reviewed: Allergy & Precautions, NPO status , Patient's Chart, lab work & pertinent test results  Airway Mallampati: II   Neck ROM: full    Dental   Pulmonary neg pulmonary ROS,  breath sounds clear to auscultation        Cardiovascular hypertension, Rhythm:regular Rate:Normal     Neuro/Psych Right SDH    GI/Hepatic   Endo/Other    Renal/GU      Musculoskeletal   Abdominal   Peds  Hematology   Anesthesia Other Findings   Reproductive/Obstetrics                             Anesthesia Physical Anesthesia Plan  ASA: II  Anesthesia Plan: General   Post-op Pain Management:    Induction: Intravenous  Airway Management Planned: Oral ETT  Additional Equipment:   Intra-op Plan:   Post-operative Plan: Extubation in OR  Informed Consent: I have reviewed the patients History and Physical, chart, labs and discussed the procedure including the risks, benefits and alternatives for the proposed anesthesia with the patient or authorized representative who has indicated his/her understanding and acceptance.     Plan Discussed with: CRNA, Anesthesiologist and Surgeon  Anesthesia Plan Comments:         Anesthesia Quick Evaluation

## 2015-02-12 NOTE — Progress Notes (Signed)
Patient ID: [name], male   DOB: 09-20-1935, 80 y.o.   MRN: [id number]   This chart was scribed for Elvina SidleKurt Lauenstein, MD by [facility] Abu Hashem, medical scribe at [facility].The patient was seen in exam room 06 and the patient's care was started at 2:46 PM.  Patient ID: Nicholas AreasBansidhar Denbleyker MRN: [id number], DOB: 09-20-1935, 80 y.o. Date of Encounter: 02/12/2015  Primary Physician: No PCP Per Patient  Chief Complaint:  Chief Complaint  Patient presents with   Leg Pain    Unable to stand, fell today in restroom here, weakness started yesterday   HPI:  Nicholas AreasBansidhar Ezelle is a 80 y.o. male who presents to [facility] complaining of left leg weakness and pain and a worsening right sided headache, acute onset yesterday. His daughter states when he sits down he cannot stand up. Pt also fell today when he was in the bathroom in the office, but no other recent falls or trauma. He did have urine incontinence today. He denies abdominal pain, SOB, chest pain or loss of vision.  Past Medical History  Diagnosis Date   Hypertension    Hyperlipidemia    Right ureteral stone    Hematuria     ureteral stone and stent   Chronic kidney disease     left nephrolithiasis   Home Meds: Prior to Admission medications   Medication Sig Start Date End Date Taking? Authorizing Provider  amLODipine (NORVASC) 5 MG tablet Take 5 mg by mouth every morning.     Yes Historical Provider, MD  aspirin EC 81 MG tablet Take 81 mg by mouth daily.   Yes Historical Provider, MD  simvastatin (ZOCOR) 20 MG tablet Take 20 mg by mouth at bedtime.     Yes Historical Provider, MD  HYDROcodone-acetaminophen (NORCO/VICODIN) 5-325 MG per tablet Take 1 tablet by mouth every 6 (six) hours as needed. Patient not taking: Reported on 02/12/2015 06/04/13   Joseph ArtJessica U Vann, DO  levothyroxine (SYNTHROID, LEVOTHROID) 25 MCG tablet Take 1 tablet (25 mcg total) by mouth daily before breakfast. Patient not  taking: Reported on 02/12/2015 06/04/13   Joseph ArtJessica U Vann, DO  lidocaine (LIDODERM) 5 % Place 1 patch onto the skin daily. Remove & Discard patch within 12 hours or as directed by MD Patient not taking: Reported on 02/12/2015 06/04/13   Joseph ArtJessica U Vann, DO  Multiple Vitamins-Minerals (MULTIVITAMIN PO) Take 1 tablet by mouth daily.    Historical Provider, MD   Allergies: No Known Allergies  History   Social History   Marital Status: Married    Spouse Name: N/A   Number of Children: N/A   Years of Education: N/A   Occupational History   Not on file.   Social History Main Topics   Smoking status: Never Smoker    Smokeless tobacco: Never Used   Alcohol Use: No   Drug Use: No   Sexual Activity: Not on file   Other Topics Concern   Not on file   Social History Narrative    Review of Systems: Constitutional: negative for chills, fever, night sweats, weight changes, or fatigue  HEENT: negative for vision changes, hearing loss, congestion, rhinorrhea, ST, epistaxis, or sinus pressure Cardiovascular: negative for chest pain or palpitations Respiratory: negative for hemoptysis, wheezing, shortness of breath, or cough Abdominal: negative for abdominal pain, nausea, vomiting, diarrhea, or constipation Dermatological: negative for rash Neurologic: negative for , dizziness, or syncope. headache All other systems reviewed and are otherwise negative with  the exception to those above and in the HPI.  Physical Exam: Blood pressure 132/98, pulse 114, temperature 97.8 F (36.6 C), temperature source Oral, resp. rate 24, height 0' (0 m), weight 0 lb (0 kg), SpO2 95 %., Cannot calculate BMI with a height equal to zero. General: Well developed, well nourished, in no acute distress. Head: Normocephalic, atraumatic, eyes without discharge, sclera non-icteric, nares are without discharge. Bilateral auditory canals clear, TM's are without perforation, pearly grey and translucent with reflective  cone of light bilaterally. Oral cavity moist, posterior pharynx without exudate, erythema, peritonsillar abscess, or post nasal drip.  Neck: Supple. No thyromegaly. Full ROM. No lymphadenopathy. Lungs: Clear bilaterally to auscultation without wheezes, rales, or rhonchi. Breathing is unlabored. Heart: RRR with S1 S2. No murmurs, rubs, or gallops appreciated. Abdomen: Soft, non-tender, non-distended with normoactive bowel sounds. No hepatomegaly. No rebound/guarding. No obvious abdominal masses. Pants are wet due to urine incontinence.  Msk:  Weakness in the left lower extremity. Extremities/Skin: Warm and dry. No clubbing or cyanosis. No edema. No rashes or suspicious lesions. Neuro: Alert and oriented X 3. Moves all extremities spontaneously. Gait is normal. CNII-XII grossly in tact. Psych:  Responds to questions appropriately with a normal affect.    ASSESSMENT AND PLAN:  80 y.o. year old male with  This chart was scribed in my presence and reviewed by me personally.    ICD-9-CM ICD-10-CM   1. Ataxia 781.3 R27.0   2. Falling, initial encounter E888.9 W19.Betha Loa, Elvina Sidle, MD   Possible stroke. EMS called and report given to charge nurse at [facility] emergency department. Signed, Elvina Sidle, MD 02/12/2015 2:46 PM

## 2015-02-12 NOTE — H&P (Signed)
NAMElson Areas:  [name], [name]             ACCOUNT NO.:  [id number]  MEDICAL RECORD NO.:  [id number]  LOCATION:  [facility]                         [facility]  PHYSICIAN:  Hilda LiasErnesto Kanye Depree, M.D.   DATE OF BIRTH:  [birth year]  DATE OF ADMISSION:  02/12/2015 DATE OF DISCHARGE:                             HISTORY & PHYSICAL   Room number is pending.  HISTORY OF PRESENT ILLNESS:  The patient is a 80 years old gentleman, who was brought today by the family to the emergency room after he was seen in the urgent care.  It seemed that about 2 months ago while he was in [place], he fell.  There was some question of weakness of the legs. About 2 years ago, he had the same symptom and he ended up in the emergency room.  [facility], he was found that he has weakness of the left leg.  [facility], an MRI was done and showed a large subdural hematoma in the right side with shift to the left.  PAST MEDICAL HISTORY:  He has a history of kidney stone and hypertension.  MEDICATIONS:  He is taking aspirin, Norvasc, Zocor, Norco, and thyroid.  REVIEW OF SYSTEMS:  Positive for no chest pain.  No history of seizure. Mostly weakness, trauma to the head.  PHYSICAL EXAMINATION:  HEAD, EYES, EARS, NOSE, AND THROAT:  Normal. NECK:  Normal.  LUNGS:  Clear.  HEART:  Sounds normal.  ABDOMEN: Normal.  EXTREMITIES:  Normal.  NEURO:  The patient badly speaks in [place].  I was able to communicate through his granddaughter.  He is complaining of headache in the right side.  Strength, he has weakness mostly of the left leg.  Reflexes 1+.  Sensation normal.  The MRI of the head showed that he has a large subdural hematoma in the right side from the frontal to the temporal and parietal about 25 mm. There is a shift from right to the left.  CLINICAL IMPRESSION:  Right subdural hematoma, chronic, shift from right to left.  RECOMMENDATION:  I talked to the family at length and  mentioned to them that unfortunately the only way out would be surgery.  The surgery was fully explained to his family between__________ 1 or 2 burr hole _or_________ craniotomy leaving at least 2 drains.  The risk of such as _________ infection, CSF leak, paralysis, seizure, reaccumulation with the need for further surgery.  A decision about leaving the bone flap in place will be made during surgery.  We decide with the craniotomy, most likely we keep the bone in place or put in the abdominal wall.  If we do the 2 burr holes, probably no need to do any placement of the bone in the abdominal wall.  We are going to call the operative room team as soon as possible.          ______________________________ Hilda LiasErnesto Azeem Poorman, M.D.     EB/MEDQ  D:  02/12/2015  T:  02/12/2015  Job:  [id number]

## 2015-02-12 NOTE — ED Provider Notes (Signed)
History is obtained from language line using medical interpreter. Patient complained of lateral foot pain and decreased ability to walk for the past 2 days. He presently has no pain. Seen at urgent care. He fell in the bathroom at urgent care, striking his head. He denies loss of consciousness or syncope. No neck pain. On exam patient is alert appropriate cranial nerves II through XII grossly intact. Moves all extremities well. He is unable to ambulate gait is extremely unsteady with his 4 pronged cane.  7 PM patient remains alert Glasgow Coma Score 15. Moves all extremities. Cranial nerves II through XII intact. Further history patient fell partially 1.5 months ago. I spoke with Dr. Jeral FruitBOtero, who will evaluate patient for surgery Results for orders placed or performed during the hospital encounter of 02/12/15  Ethanol  Result Value Ref Range   Alcohol, Ethyl (B) <5 0 - 9 mg/dL  Protime-INR  Result Value Ref Range   Prothrombin Time 14.7 11.6 - 15.2 seconds   INR 1.13 0.00 - 1.49  APTT  Result Value Ref Range   aPTT 34 24 - 37 seconds  CBC  Result Value Ref Range   WBC 8.5 4.0 - 10.5 K/uL   RBC 4.78 4.22 - 5.81 MIL/uL   Hemoglobin 15.1 13.0 - 17.0 g/dL   HCT 40.944.1 81.139.0 - 91.452.0 %   MCV 92.3 78.0 - 100.0 fL   MCH 31.6 26.0 - 34.0 pg   MCHC 34.2 30.0 - 36.0 g/dL   RDW 78.212.6 95.611.5 - 21.315.5 %   Platelets 200 150 - 400 K/uL  Differential  Result Value Ref Range   Neutrophils Relative % 56 43 - 77 %   Neutro Abs 4.8 1.7 - 7.7 K/uL   Lymphocytes Relative 34 12 - 46 %   Lymphs Abs 2.9 0.7 - 4.0 K/uL   Monocytes Relative 8 3 - 12 %   Monocytes Absolute 0.7 0.1 - 1.0 K/uL   Eosinophils Relative 1 0 - 5 %   Eosinophils Absolute 0.1 0.0 - 0.7 K/uL   Basophils Relative 1 0 - 1 %   Basophils Absolute 0.0 0.0 - 0.1 K/uL  Comprehensive metabolic panel  Result Value Ref Range   Sodium 140 135 - 145 mmol/L   Potassium 4.5 3.5 - 5.1 mmol/L   Chloride 102 96 - 112 mmol/L   CO2 23 19 - 32 mmol/L   Glucose, Bld 121 (H) 70 - 99 mg/dL   BUN 9 6 - 23 mg/dL   Creatinine, Ser 0.860.82 0.50 - 1.35 mg/dL   Calcium 9.7 8.4 - 57.810.5 mg/dL   Total Protein 7.8 6.0 - 8.3 g/dL   Albumin 4.5 3.5 - 5.2 g/dL   AST 28 0 - 37 U/L   ALT 23 0 - 53 U/L   Alkaline Phosphatase 36 (L) 39 - 117 U/L   Total Bilirubin 1.0 0.3 - 1.2 mg/dL   GFR calc non Af Amer 83 (L) >90 mL/min   GFR calc Af Amer >90 >90 mL/min   Anion gap 15 5 - 15  I-stat troponin, ED  Result Value Ref Range   Troponin i, poc 0.00 0.00 - 0.08 ng/mL   Comment 3          I-Stat Chem 8, ED  Result Value Ref Range   Sodium 140 135 - 145 mmol/L   Potassium 4.4 3.5 - 5.1 mmol/L   Chloride 102 96 - 112 mmol/L   BUN 11 6 - 23 mg/dL   Creatinine,  Ser 0.70 0.50 - 1.35 mg/dL   Glucose, Bld 161 (H) 70 - 99 mg/dL   Calcium, Ion 0.96 (L) 1.13 - 1.30 mmol/L   TCO2 22 0 - 100 mmol/L   Hemoglobin 16.3 13.0 - 17.0 g/dL   HCT 04.5 40.9 - 81.1 %   Mr Brain Wo Contrast  02/12/2015   CLINICAL DATA:  Left-sided weakness  EXAM: MRI HEAD WITHOUT CONTRAST  TECHNIQUE: Multiplanar, multiecho pulse sequences of the brain and surrounding structures were obtained without intravenous contrast.  COMPARISON:  06/03/2013  FINDINGS: There is a large probably chronic subdural on the right along the lateral convexity measuring up to 2.5 cm in thickness with mass effect resulting in midline shift from right to left of 11 mm. No sign of acute infarction. No subdural on the left are in the posterior fossa. Mild chronic small-vessel changes of the deep white matter. No mass lesion. No hydrocephalus. No pituitary mass. No inflammatory sinus disease. No skull or skullbase lesion.  IMPRESSION: Large mostly chronic subdural hematoma on the right, up to 2.5 cm in thickness with mass effect and right-to-left shift. Chronicity could be better judged with CT, but that may not be of any practical relevance.  Critical Value/emergent results were called by telephone at the time of interpretation  on 02/12/2015 at 5:44 pm to [facility] , who verbally acknowledged these results.   Electronically Signed   By: Paulina Fusi M.D.   On: 02/12/2015 17:47    Diagnosis subdural hematoma  Doug Sou, MD 02/13/15 [id number]

## 2015-02-12 NOTE — Progress Notes (Signed)
ED CM spoke with Dr. Ethelda ChickJacubowitz concerning patient. [facility] with c/o left side weakness inability to ambulate x 3 day. Reviewed record [employer] coverage  And [facility]. [facility], as per family. Spoke with patient and family via interpreter discussed  goals of care, and possible disposition plan, verbalized understanding.  ED evaluation still in progress. Patient is being transported MRI . CM spoke with Dr. Caprice KluverJacubowtiz and Lillia AbedLindsay RN on Pod A regarding possible disposition plan. CM will follow up with patient.

## 2015-02-12 NOTE — Anesthesia Procedure Notes (Signed)
Procedure Name: Intubation Date/Time: 02/12/2015 8:36 PM Performed by: Molli HazardGORDON, Vestal Markin M Pre-anesthesia Checklist: Patient identified, Emergency Drugs available, Suction available and Patient being monitored Patient Re-evaluated:Patient Re-evaluated prior to inductionOxygen Delivery Method: Circle system utilized Preoxygenation: Pre-oxygenation with 100% oxygen Intubation Type: IV induction Ventilation: Mask ventilation without difficulty and Oral airway inserted - appropriate to patient size Laryngoscope Size: Hyacinth MeekerMiller and 2 Grade View: Grade I Tube type: Subglottic suction tube Tube size: 7.5 mm Number of attempts: 1 Airway Equipment and Method: Stylet Placement Confirmation: ETT inserted through vocal cords under direct vision,  positive ETCO2 and breath sounds checked- equal and bilateral Secured at: 23 cm Tube secured with: Tape Dental Injury: Teeth and Oropharynx as per pre-operative assessment

## 2015-02-12 NOTE — ED Provider Notes (Signed)
CSN: [id number]     Arrival date & time 02/12/15  1538 History   First MD Initiated Contact with Patient 02/12/15 1549     Chief Complaint  Patient presents with  . Weakness     (Consider location/radiation/quality/duration/timing/severity/associated sxs/prior Treatment) Patient is a 80 y.o. male presenting with weakness. The history is provided by the patient, the spouse and a relative. The history is limited by a language barrier. A language interpreter was used (language line).  Weakness This is a new problem. The current episode started in the past 7 days. The problem occurs constantly. The problem has been unchanged. Associated symptoms include arthralgias, fatigue, myalgias and weakness. Pertinent negatives include no abdominal pain, chest pain, chills, coughing, fever, headaches, nausea or vomiting. The symptoms are aggravated by walking. He has tried nothing for the symptoms. The treatment provided no relief.    Per pt and family, pt has had bilateral foot pain and weakness for at least 3 days making it difficult for pt to ambulate even with the cane he has used for the last 2 years.  Pt went to urgent care for evaluation but while there, he fell in the bathroom due to unsteady gait, striking the back of his head. No LOC. Denies nausea or vomiting. Pt c/o bilateral foot pain but while lying still in exam bed, pt states he feels at baseline. No fever, chills, n/v/d. No hx of stroke. Pt takes one  aspirin daily, BP and cholesterol medication. No other significant PMH.   Past Medical History  Diagnosis Date  . Hypertension   . Hyperlipidemia   . Right ureteral stone   . Hematuria     ureteral stone and stent  . Chronic kidney disease     left nephrolithiasis   Past Surgical History  Procedure Laterality Date  . Cystoscopy w/ ureteral stent placement  01-02-11    right  . Extracorporeal shock wave lithotripsy  02-05-11    right  . Cataract extraction w/ intraocular lens   implant, bilateral    . Cystoscopy with ureteroscopy  09/17/2011    Procedure: CYSTOSCOPY WITH URETEROSCOPY;  Surgeon: Milford Cage, MD;  Location: [facility];  Service: Urology;  Laterality: Right;  CYSTOSCOPY, RIGHT URETEROSCOPY WITH LASER LITHO AND STENT    History reviewed. No pertinent family history. History  Substance Use Topics  . Smoking status: Never Smoker   . Smokeless tobacco: Never Used  . Alcohol Use: No    Review of Systems  Constitutional: Positive for appetite change (loss of appetite this morning) and fatigue. Negative for fever and chills.  Respiratory: Negative for cough and shortness of breath.   Cardiovascular: Negative for chest pain and palpitations.  Gastrointestinal: Negative for nausea, vomiting, abdominal pain and diarrhea.  Musculoskeletal: Positive for myalgias and arthralgias.       Bilateral foot pain  Neurological: Positive for weakness. Negative for headaches.  All other systems reviewed and are negative.     Allergies  Review of patient's allergies indicates no known allergies.  Home Medications   Prior to Admission medications   Medication Sig Start Date End Date Taking? Authorizing Provider  amLODipine (NORVASC) 5 MG tablet Take 5 mg by mouth every morning.     Yes Historical Provider, MD  aspirin EC 81 MG tablet Take 81 mg by mouth daily.   Yes Historical Provider, MD  Multiple Vitamins-Minerals (MULTIVITAMIN PO) Take 1 tablet by mouth daily.   Yes Historical Provider, MD  simvastatin (ZOCOR) 20  MG tablet Take 20 mg by mouth at bedtime.     Yes Historical Provider, MD  HYDROcodone-acetaminophen (NORCO/VICODIN) 5-325 MG per tablet Take 1 tablet by mouth every 6 (six) hours as needed. Patient not taking: Reported on 02/12/2015 06/04/13   Joseph Art, DO  levothyroxine (SYNTHROID, LEVOTHROID) 25 MCG tablet Take 1 tablet (25 mcg total) by mouth daily before breakfast. Patient not taking: Reported on 02/12/2015 06/04/13    Joseph Art, DO  lidocaine (LIDODERM) 5 % Place 1 patch onto the skin daily. Remove & Discard patch within 12 hours or as directed by MD Patient not taking: Reported on 02/12/2015 06/04/13   Shanda Bumps U Vann, DO   BP 132/88 mmHg  Pulse 101  Temp(Src) 98.2 F (36.8 C) (Oral)  Resp 27  Ht  (1.651 m)  Wt 145 lb (65.772 kg)  BMI 24.13 kg/m2  SpO2 97% Physical Exam  Constitutional: He appears well-developed and well-nourished.  HENT:  Head: Normocephalic and atraumatic.  Eyes: Conjunctivae and EOM are normal. Pupils are equal, round, and reactive to light. No scleral icterus.  Neck: Normal range of motion. Neck supple.  Cardiovascular: Regular rhythm and normal heart sounds.  Tachycardia present.   Pulmonary/Chest: Effort normal and breath sounds normal. No respiratory distress. He has no wheezes. He has no rales. He exhibits no tenderness.  Abdominal: Soft. Bowel sounds are normal. He exhibits no distension and no mass. There is no tenderness. There is no rebound and no guarding.  Musculoskeletal: Normal range of motion.  Neurological: He is alert.  Alert to person, place and time. Normal finger to nose coordination.FROM upper and lower extremities while lying in bed. 5/5 grip strength bilaterally. 5/5 strength in bilateral legs with straight leg raise and plantarflexion. Difficulty sitting up on his own in bed, difficulty standing. Puts all his weight on Right leg while standing using a cane and 1 person assist.  Unsafe to ambulate further than 2-3 steps.   Skin: Skin is warm and dry.  Nursing note and vitals reviewed.   ED Course  Procedures (including critical care time) Labs Review Labs Reviewed  COMPREHENSIVE METABOLIC PANEL - Abnormal; Notable for the following:    Glucose, Bld 121 (*)    Alkaline Phosphatase 36 (*)    GFR calc non Af Amer 83 (*)    All other components within normal limits  I-STAT CHEM 8, ED - Abnormal; Notable for the following:    Glucose, Bld 127 (*)     Calcium, Ion 1.11 (*)    All other components within normal limits  ETHANOL  PROTIME-INR  APTT  CBC  DIFFERENTIAL  URINALYSIS, ROUTINE W REFLEX MICROSCOPIC  URINE RAPID DRUG SCREEN (HOSP PERFORMED)  I-STAT TROPOININ, ED  Rosezena Sensor, ED    Imaging Review Mr Brain Wo Contrast  02/12/2015   CLINICAL DATA:  Left-sided weakness  EXAM: MRI HEAD WITHOUT CONTRAST  TECHNIQUE: Multiplanar, multiecho pulse sequences of the brain and surrounding structures were obtained without intravenous contrast.  COMPARISON:  06/03/2013  FINDINGS: There is a large probably chronic subdural on the right along the lateral convexity measuring up to 2.5 cm in thickness with mass effect resulting in midline shift from right to left of 11 mm. No sign of acute infarction. No subdural on the left are in the posterior fossa. Mild chronic small-vessel changes of the deep white matter. No mass lesion. No hydrocephalus. No pituitary mass. No inflammatory sinus disease. No skull or skullbase lesion.  IMPRESSION: Large mostly  chronic subdural hematoma on the right, up to 2.5 cm in thickness with mass effect and right-to-left shift. Chronicity could be better judged with CT, but that may not be of any practical relevance.  Critical Value/emergent results were called by telephone at the time of interpretation on 02/12/2015 at 5:44 pm to [facility] , who verbally acknowledged these results.   Electronically Signed   By: Paulina FusiMark  Shogry M.D.   On: 02/12/2015 17:47     EKG Interpretation   Date/Time:  Saturday February 12 2015 15:54:25 EDT Ventricular Rate:  109 PR Interval:  174 QRS Duration: 98 QT Interval:  342 QTC Calculation: 460 R Axis:   125 Text Interpretation:  Sinus tachycardia Consider right atrial enlargement  Left posterior fascicular block Borderline T abnormalities, inferior leads  SINCE LAST TRACING HEART RATE HAS INCREASED Confirmed by Ethelda ChickJACUBOWITZ  MD,  [phone] on 02/12/2015 3:58:44 PM      MDM   Final  diagnoses:  Subdural hematoma    Pt is a 80yo male presenting from Urgent care where pt had syncopal episode, striking the back of his head.  History somewhat hindered due to language barrier. Family member as well as Lawyerlanguage line used.  On exam, pt had extreme difficulty sitting up in bed on his own, had to use cane and 1 person assist to stand and shuffle 2-3 feet.  Concern for CVA, SAH, intracranial mass, weakness from infection-possible UTI, doubt pneumonia as pt has not fever, cough congestion.  Dr. Ethelda ChickJacubowitz also examined pt.  Symptoms started 3 days ago so stroke order set selected with MRI brain but pt not a TPA candidate due to duration of symptoms.   6:29 PM Reviewed imaging with pt and family.  Family does mention pt fell in the bathroom previously about 1-2 months ago but was not having any problems so he was never evaluated by a medical provider at that time.    Dr. Ethelda ChickJacubowitz consulted with neurosurgery Dr. Jeral FruitBotero, recommends emergent neurosurgery for subdural hematoma. Pt taken from ED to surgery.      Junius Finnerrin O'Malley, PA-C 02/12/15 1914  Doug SouSam Jacubowitz, MD 02/13/15 [phone]

## 2015-02-12 NOTE — Progress Notes (Signed)
Patient ID: [name], male   DOB: 03-10-1935, 80 y.o.   MRN: [id number] Patient woke up confused, moving all extremities. Ativan was given. i brought the family and he was able to talk to them. Both drains are working. Ct head tomorrow

## 2015-02-12 NOTE — ED Notes (Signed)
Pt to department via EMS from [facility]. Pt was there for left leg weakness and headache that started 24 hours ago. Pt had a syncopal episode in the bathroom and was incontinent. Bp-141/98 Hr-107 EKG unremarkable. 20g LAC.

## 2015-02-12 NOTE — ED Notes (Signed)
Provider at the bedside.  

## 2015-02-12 NOTE — Anesthesia Postprocedure Evaluation (Signed)
Anesthesia Post Note  Patient: [name]  Procedure(s) Performed: Procedure(s) (LRB): Burr Holes for HEMATOMA EVACUATION SUBDURAL (Right)  Anesthesia type: General  Patient location: PACU  Post pain: Pain level controlled and Adequate analgesia  Post assessment: Post-op Vital signs reviewed, Patient's Cardiovascular Status Stable, Respiratory Function Stable, Patent Airway and Pain level controlled  Last Vitals:  Filed Vitals:   02/12/15 2230  BP: 113/97  Pulse: 92  Temp:   Resp: 19    Post vital signs: Reviewed and stable  Level of consciousness: awake, alert  and oriented  Complications: No apparent anesthesia complications

## 2015-02-12 NOTE — Transfer of Care (Signed)
Immediate Anesthesia Transfer of Care Note  Patient: [name]  Procedure(s) Performed: Procedure(s): IAC/InterActive[employer]Burr Holes for HEMATOMA EVACUATION SUBDURAL (Right)  Patient Location: PACU  Anesthesia Type:General  Level of Consciousness: awake  Airway & Oxygen Therapy: Patient connected to face mask oxygen  Post-op Assessment: Report given to RN, Patient moving all extremities X 4 and patient not following commands, arms stiff at sides, first down by side, then over head. Dr Jeral FruitBotero notified and arrives at bedside. Suspects seizure activity, treated with ativan.  Post vital signs: Reviewed and stable  Last Vitals:  Filed Vitals:   02/12/15 2145  BP: 143/84  Pulse: 119  Temp:   Resp: 28    Complications: No apparent anesthesia complications

## 2015-02-13 LAB — BASIC METABOLIC PANEL
Anion gap: 16 — ABNORMAL HIGH (ref 5–15)
BUN: 9 mg/dL (ref 6–23)
CO2: 19 mmol/L (ref 19–32)
Calcium: 8.3 mg/dL — ABNORMAL LOW (ref 8.4–10.5)
Chloride: 105 mmol/L (ref 96–112)
Creatinine, Ser: 0.76 mg/dL (ref 0.50–1.35)
GFR calc Af Amer: >90 mL/min (ref >90)
GFR, EST NON AFRICAN AMERICAN: 86 mL/min — AB (ref >90)
Glucose, Bld: 135 mg/dL — ABNORMAL HIGH (ref 70–99)
Potassium: 4.2 mmol/L (ref 3.5–5.1)
Sodium: 140 mmol/L (ref 135–145)

## 2015-02-13 LAB — CBC
HCT: 39.6 % (ref 39.0–52.0)
HEMOGLOBIN: 13.7 g/dL (ref 13.0–17.0)
MCH: 32.2 pg (ref 26.0–34.0)
MCHC: 34.6 g/dL (ref 30.0–36.0)
MCV: 93 fL (ref 78.0–100.0)
PLATELETS: 199 10*3/uL (ref 150–400)
RBC: 4.26 MIL/uL (ref 4.22–5.81)
RDW: 12.7 % (ref 11.5–15.5)
WBC: 11 10*3/uL — ABNORMAL HIGH (ref 4.0–10.5)

## 2015-02-13 LAB — MRSA PCR SCREENING: MRSA by PCR: NEGATIVE

## 2015-02-13 MED ORDER — CETYLPYRIDINIUM CHLORIDE 0.05 % MT LIQD
7.0000 mL | Freq: Two times a day (BID) | OROMUCOSAL | Status: DC
  Administered 2015-02-13 – 2015-02-18 (×12): 7 mL via OROMUCOSAL

## 2015-02-13 NOTE — Progress Notes (Signed)
Patient ID: [name], male   DOB: 11/11/1935, 80 y.o.   MRN: [id number] Awake, talking to his famly. No weakness. Both drains are working. Plan oob, diet as tolerated

## 2015-02-14 ENCOUNTER — Encounter (HOSPITAL_COMMUNITY): Payer: Self-pay | Admitting: Neurosurgery

## 2015-02-14 NOTE — Progress Notes (Signed)
Patient ID: [name], male   DOB: 1934/12/13, 80 y.o.   MRN: [id number] Stable, no weakness. Both drains are working . To get a ct head in am

## 2015-02-14 NOTE — Progress Notes (Signed)
UR completed.  Franklyn Cafaro, RN BSN [facility] CCM Trauma/Neuro ICU Case Manager [phone]  

## 2015-02-14 NOTE — Progress Notes (Signed)
Chaplain initiated visit with pt and family. Chaplain introduced herself and her services. Page chaplain as needed.    02/14/15 1000  Clinical Encounter Type  Visited With Patient and family together  Visit Type Initial;Spiritual support  Spiritual Encounters  Spiritual Needs Emotional  Stress Factors  Patient Stress Factors None identified  Family Stress Factors None identified  Jaylinn Hellenbrand, Mayer MaskerCourtney F, Chaplain 02/14/2015 10:17 AM

## 2015-02-14 NOTE — Op Note (Signed)
NAMMarland Kitchen:  [name], [name]             ACCOUNT NO.:  [id number]  MEDICAL RECORD NO.:  [id number]  LOCATION:  3M03C                        FACILITY:  [facility]  PHYSICIAN:  Hilda LiasErnesto Braden Cimo, M.D.   DATE OF BIRTH:  Mar 22, 1935  DATE OF PROCEDURE:  02/12/2015 DATE OF DISCHARGE:                              OPERATIVE REPORT   [facility] 3N03[facility].  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  POSTOPERATIVE DIAGNOSIS:  Chronic right frontotemporal parietal occipital subdural hematoma with shift right to the left, status post 2 months ago.  PROCEDURE: 1. Two burr holes, one frontal and one parietal. 2. Drainage of the chronic subdural hematoma. 3. Insertion of two large drains.  SURGEON:  Hilda LiasErnesto Edelin Fryer, M.D.  CLINICAL HISTORY:  [name] is a gentleman, who 2 months ago was in [place].  He felt that he did not tell anyone in the family.  Yesterday morning, he was taken to the ER urgent care  because he was having some problem walking with the left leg.  [facility], he had an emergency MRI, which showed one large subdural hematoma, probably 1/5th of the brain cavity.  I talked to the family with a translator and also to [name].  Surgery was advised.  They knew the procedure, which will be 2 burr holes with the risk of reaccumulation, need for more further surgery, infection, seizure, and paralysis.  DESCRIPTION OF PROCEDURE:  The patient was taken to the OR, and after intubation, the right side of the head was shaved and prepped with DuraPrep.  Drapes were applied.  We selected to spot one in the posterior frontal and one in the medial parietal area.  Two incisions were made through the skin down to the galea.  Retraction was done .  Using the craniotome, we made two holes of approximately 6 x 6 cm.  Then, the dura matter  were  opened.  Immediately high pressure fluid came thru the openings. The area was irrigated until we were able to drain clear fluid.  From then on, we inserted 2 Jackson-Pratt, large drains into the subdural space, one aiming forward and the other one aiming posteriorly. Both of them were brought through a different incision.  From then on, the 2 incisions were closed with Vicryl and staples.  The patient was extubated and was transferred to the intensive care unit.  The patient woke up moving all the 4 extremities.          ______________________________ Hilda LiasErnesto Monta Maiorana, M.D.     EB/MEDQ  D:  02/13/2015  T:  02/14/2015  Job:  [id number]

## 2015-02-15 ENCOUNTER — Inpatient Hospital Stay (HOSPITAL_COMMUNITY): Payer: Medicare Other

## 2015-02-15 NOTE — Progress Notes (Signed)
Patient ID: [name] AreasBansidhar Vohra, male   DOB: 1935-11-01, 80 y.o.   MRN: [id number] Doing well. No shift. To remove drains in am.

## 2015-02-15 NOTE — Progress Notes (Signed)
Pt has new onset cough this afternoon. Trace of blood noted on the back of patient's tongue. I reported the change to Dr. Jeral FruitBotero. Will continue to monitor.

## 2015-02-16 DIAGNOSIS — S065X4S Traumatic subdural hemorrhage with loss of consciousness of 6 hours to 24 hours, sequela: Secondary | ICD-10-CM

## 2015-02-16 MED ORDER — PANTOPRAZOLE SODIUM 40 MG PO TBEC
40.0000 mg | DELAYED_RELEASE_TABLET | Freq: Every day | ORAL | Status: DC
Start: 2015-02-16 — End: 2015-02-18
  Administered 2015-02-16 – 2015-02-18 (×3): 40 mg via ORAL
  Filled 2015-02-16 (×3): qty 1

## 2015-02-16 MED ORDER — HYDROCODONE-ACETAMINOPHEN 5-325 MG PO TABS
1.0000 | ORAL_TABLET | ORAL | Status: DC | PRN
  Administered 2015-02-16 – 2015-02-17 (×3): 1 via ORAL
  Filled 2015-02-16 (×4): qty 1

## 2015-02-16 NOTE — Progress Notes (Signed)
Dr. Jeral FruitBotero removed drains this am and gave orders for pt to transfer to 4N today.

## 2015-02-16 NOTE — Progress Notes (Signed)
Patient ID: [name], male   DOB: [birth year], 80 y.o.   MRN: [id number] Doing well. Both drains were removed to the floor. Pt to see

## 2015-02-16 NOTE — Evaluation (Signed)
Physical Therapy Evaluation Patient Details Name: [name] MRN: [id number] DOB: [birth year] Today's Date: 02/16/2015   History of Present Illness  Patient is a 80 y/o male admitted s/p fall when in [place] a month ago and had some memory decline, then recent fall at home and had LE weakness, unable to ambulate; an MRI was done and showed a large subdural hematoma in the right side with shift to the left.  Now s/p two burr holes with drains done 02/14/15.  Clinical Impression  Patient presents with decreased balance, decreased postural awareness, decreased strength, poor deficit awareness and admits decreased STM prior to surgery.  Feel he will benefit from skilled PT in the acute setting and follow up interdisciplinary rehab at [facility].  Will have 24 hour supervision from wife at d/c, but she seems tentative to report that she can provide physical help.  Sister is concerned about falls and potential for further falls at home.    Follow Up Recommendations CIR    Equipment Recommendations  None recommended by PT    Recommendations for Other Services       Precautions / Restrictions Precautions Precautions: Fall      Mobility  Bed Mobility Overal bed mobility: Needs Assistance Bed Mobility: Supine to Sit;Sit to Supine     Supine to sit: Mod assist Sit to supine: Min guard   General bed mobility comments: assist to move feet off bed and to lift trunk; increased time required to process after command given to lie down  Transfers Overall transfer level: Needs assistance Equipment used: Rolling walker (2 wheeled) Transfers: Sit to/from Stand Sit to Stand: Mod assist         General transfer comment: increased assist due to pt trying to stand prior to being scooted out to edge of bed; height of bed high for him so has mechanical advantage, but still needs increased assist to stand.  Ambulation/Gait Ambulation/Gait assistance: Min assist;Mod assist Ambulation Distance (Feet): 130  Feet Assistive device: Rolling walker (2 wheeled) Gait Pattern/deviations: Decreased stride length;Narrow base of support;Step-through pattern     General Gait Details: needs assist due to leaning posteriorly and high fall risk even with walker.  Able to turn with walker and assist to keep balance and manage walker  Stairs            Wheelchair Mobility    Modified Rankin (Stroke Patients Only)       Balance Overall balance assessment: History of Falls;Needs assistance       Postural control: Posterior lean Standing balance support: Bilateral upper extremity supported Standing balance-Leahy Scale: Poor Standing balance comment: UE support needed and with posterior bias needs at fluctuating min to mod support                             Pertinent Vitals/Pain Pain Assessment: No/denies pain    Home Living Family/patient expects to be discharged to:: Private residence Living Arrangements: Spouse/significant other Available Help at Discharge: Family;Available 24 hours/day Type of Home: House Home Access: Level entry     Home Layout: One level Home Equipment: Walker - 2 wheels      Prior Function Level of Independence: Independent               Hand Dominance        Extremity/Trunk Assessment               Lower Extremity Assessment: LLE deficits/detail  LLE Deficits / Details: reports weakness in left LE, but lifts antigravity, drags foot occationally with ambulation     Communication   Communication: Prefers language other than English  Cognition Arousal/Alertness: Awake/alert Behavior During Therapy: WFL for tasks assessed/performed Overall Cognitive Status: Difficult to assess                      General Comments General comments (skin integrity, edema, etc.): patient and wife with broken English, some difficulty understanding, so pt's sister assisted with discussing d/c options    Exercises         Assessment/Plan    PT Assessment Patient needs continued PT services  PT Diagnosis Abnormality of gait;Generalized weakness   PT Problem List Decreased strength;Decreased mobility;Decreased safety awareness;Decreased balance;Decreased knowledge of use of DME;Decreased activity tolerance  PT Treatment Interventions DME instruction;Therapeutic exercise;Gait training;Balance training;Neuromuscular re-education;Functional mobility training;Therapeutic activities;Patient/family education   PT Goals (Current goals can be found in the Care Plan section) Acute Rehab PT Goals Patient Stated Goal: To go to rehab PT Goal Formulation: With patient/family Time For Goal Achievement: 03/02/15 Potential to Achieve Goals: Good    Frequency Min 4X/week   Barriers to discharge        Co-evaluation               End of Session Equipment Utilized During Treatment: Gait belt Activity Tolerance: Patient tolerated treatment well Patient left: in bed;with call bell/phone within reach;with family/visitor present           Time: 1125-1150 PT Time Calculation (min) (ACUTE ONLY): 25 min   Charges:   PT Evaluation $Initial PT Evaluation Tier I: 1 Procedure PT Treatments $Gait Training: 8-22 mins   PT G Codes:        Afnan Emberton,CYNDI 02/16/2015, 12:12 PM  Sheran Lawlessyndi Emiko Osorto, PT [phone] 02/16/2015

## 2015-02-16 NOTE — Progress Notes (Signed)
Pt received alert, verbal with no noted distress. Pt stable, neuro intact. Able to move all extremities. Dressing noted to right side of head, dry and intact. No drainage noted at this time. Pt denies pain or discomfort. Pt oriented to room. Safety measures in place. Call bell within reach. Will continue to monitor. Report received by nurse Lyla Sonarrie, RN.

## 2015-02-16 NOTE — Consult Note (Signed)
Physical Medicine and Rehabilitation Consult Reason for Consult: Chronic right frontotemporal parietal occipital subdural hematoma Referring Physician: Dr. Jeral FruitBotero   HPI: [name] is a 80 y.o. right handed male limited English-speaking with history of hypertension. By report patient lives with his wife independently prior to admission using a 4 prong cane. Presented 02/12/2015 after being seen at the urgent care with left-sided weakness. By report patient with a recent fall approximate 2 months ago while visiting in [place] as well as a fall in the bathroom while at urgent care striking his head. MRI of the brain showed a large subdural hematoma right side with shift to the left. Underwent to burr hole procedures 1 frontal 1 parietal drainage of chronic subdural hematoma insertion of 2 large drains 02/12/2015 per Dr. Jeral FruitBotero. Maintained on Keppra for seizure prophylaxis. He is tolerating a regular diet. Physical therapy evaluation completed 02/16/2015 with recommendations of physical medicine rehabilitation consult.   Review of Systems  Musculoskeletal: Positive for falls.  All other systems reviewed and are negative.  Past Medical History  Diagnosis Date  . Hypertension   . Hyperlipidemia   . Right ureteral stone   . Hematuria     ureteral stone and stent  . Chronic kidney disease     left nephrolithiasis   Past Surgical History  Procedure Laterality Date  . Cystoscopy w/ ureteral stent placement  01-02-11    right  . Extracorporeal shock wave lithotripsy  02-05-11    right  . Cataract extraction w/ intraocular lens  implant, bilateral    . Cystoscopy with ureteroscopy  09/17/2011    Procedure: CYSTOSCOPY WITH URETEROSCOPY;  Surgeon: Milford Cageaniel Young Woodruff, MD;  [facility];  Service: Urology;  Laterality: Right;  CYSTOSCOPY, RIGHT URETEROSCOPY WITH LASER LITHO AND STENT   . Craniotomy Right 02/12/2015    Procedure: Ezekiel InaBurr Holes for HEMATOMA EVACUATION  SUBDURAL;  Surgeon: Hilda LiasErnesto Botero, MD;  [facility];  Service: Neurosurgery;  Laterality: Right;   History reviewed. No pertinent family history. Social History:  reports that he has never smoked. He has never used smokeless tobacco. He reports that he does not drink alcohol or use illicit drugs. Allergies: No Known Allergies Medications Prior to Admission  Medication Sig Dispense Refill  . amLODipine (NORVASC) 5 MG tablet Take 5 mg by mouth every morning.      [name]. aspirin EC 81 MG tablet Take 81 mg by mouth daily.    . Multiple Vitamins-Minerals (MULTIVITAMIN PO) Take 1 tablet by mouth daily.    . simvastatin (ZOCOR) 20 MG tablet Take 20 mg by mouth at bedtime.      [name]. HYDROcodone-acetaminophen (NORCO/VICODIN) 5-325 MG per tablet Take 1 tablet by mouth every 6 (six) hours as needed. (Patient not taking: Reported on 02/12/2015) 30 tablet 0  . levothyroxine (SYNTHROID, LEVOTHROID) 25 MCG tablet Take 1 tablet (25 mcg total) by mouth daily before breakfast. (Patient not taking: Reported on 02/12/2015) 30 tablet 0  . lidocaine (LIDODERM) 5 % Place 1 patch onto the skin daily. Remove & Discard patch within 12 hours or as directed by MD (Patient not taking: Reported on 02/12/2015) 30 patch 0    Home: Home Living Family/patient expects to be discharged to:: Private residence Living Arrangements: Spouse/significant other Available Help at Discharge: Family, Available 24 hours/day Type of Home: House Home Access: Level entry Home Layout: One level Home Equipment: Walker - 2 wheels  Functional History: Prior Function Level of Independence: Independent Functional  Status:  Mobility: Bed Mobility Overal bed mobility: Needs Assistance Bed Mobility: Supine to Sit, Sit to Supine Supine to sit: Mod assist Sit to supine: Min guard General bed mobility comments: assist to move feet off bed and to lift trunk; increased time required to process after command given to lie down Transfers Overall  transfer level: Needs assistance Equipment used: Rolling walker (2 wheeled) Transfers: Sit to/from Stand Sit to Stand: Mod assist General transfer comment: increased assist due to pt trying to stand prior to being scooted out to edge of bed; height of bed high for him so has mechanical advantage, but still needs increased assist to stand. Ambulation/Gait Ambulation/Gait assistance: Min assist, Mod assist Ambulation Distance (Feet): 130 Feet Assistive device: Rolling walker (2 wheeled) Gait Pattern/deviations: Decreased stride length, Narrow base of support, Step-through pattern General Gait Details: needs assist due to leaning posteriorly and high fall risk even with walker.  Able to turn with walker and assist to keep balance and manage walker    ADL:    Cognition: Cognition Overall Cognitive Status: Difficult to assess Orientation Level: Oriented X4 (family translates) Cognition Arousal/Alertness: Awake/alert Behavior During Therapy: WFL for tasks assessed/performed Overall Cognitive Status: Difficult to assess Difficult to assess due to: Non-English speaking  Blood pressure 129/85, pulse 86, temperature 98.4 F (36.9 C), temperature source Oral, resp. rate 25, height  (1.651 m), weight 70.9 kg (156 lb 4.9 oz), SpO2 97 %. Physical Exam  Eyes: EOM are normal.  Neck: Normal range of motion. Neck supple. No thyromegaly present.  Cardiovascular: Normal rate and regular rhythm.   Respiratory: Effort normal and breath sounds normal. No respiratory distress.  GI: Soft. Bowel sounds are normal. He exhibits no distension.  Neurological: He is alert. He displays normal reflexes. He exhibits normal muscle tone.  Limited English speaking. He could state his name and follow simple demonstrated commands. He may get eye contact with examiner. Exam limited by language barrier. LUE slightly ataxic. +PD. Mild weakness 4/5. RUE 4+/5. LE's 3/5 prox to 4+/5 at ankles. No gross sensory changes.     Skin:  Craniotomy burr hole sites dressed  Psychiatric: He has a normal mood and affect.  A little impulsive     No results found for this or any previous visit (from the past 24 hour(s)). Ct Head Wo Contrast  02/15/2015   CLINICAL DATA:  Followup subdural hematoma. Subsequent encounter.  EXAM: CT HEAD WITHOUT CONTRAST  TECHNIQUE: Contiguous axial images were obtained from the base of the skull through the vertex without intravenous contrast.  COMPARISON:  MR brain 02/12/2015.  FINDINGS: The RIGHT subdural has been evacuated. Surgical drains in good position in the RIGHT frontal and RIGHT parietal temporal region. Mild pneumocephalus. Minimal residual hypoattenuating extra-axial fluid along the RIGHT convexity up to 4-5 mm thick. No significant midline shift. No left-sided collection of significance.  Generalized atrophy without focal intra-axial parenchymal abnormality. Chronic microvascular ischemic change is redemonstrated. Basal ganglia calcifications are physiologic. Mild vascular calcification. No calvarial abnormalities of the RIGHT sided burr holes. No sinus or mastoid disease.  IMPRESSION: Satisfactory appearance status post subdural evacuation. Surgical drains good position.   Electronically Signed   By: Davonna Belling M.D.   On: 02/15/2015 09:56    Assessment/Plan: Diagnosis: right Fronto-temporal-parietal-occipital acute on chronic SDH 1. Does the need for close, 24 hr/day medical supervision in concert with the patient's rehab needs make it unreasonable for this patient to be served in a less intensive setting? Yes 2. Co-Morbidities  requiring supervision/potential complications: recent falls, htn 3. Due to bladder management, bowel management, safety, skin/wound care, disease management, medication administration, pain management and patient education, does the patient require 24 hr/day rehab nursing? Yes 4. Does the patient require coordinated care of a physician, rehab nurse, PT (1-2  hrs/day, 5 days/week), OT (1-2 hrs/day, 5 days/week) and SLP (1-2 hrs/day, 5 days/week) to address physical and functional deficits in the context of the above medical diagnosis(es)? Yes Addressing deficits in the following areas: balance, endurance, locomotion, strength, transferring, bowel/bladder control, bathing, dressing, feeding, grooming, toileting, cognition and psychosocial support 5. Can the patient actively participate in an intensive therapy program of at least 3 hrs of therapy per day at least 5 days per week? Yes 6. The potential for patient to make measurable gains while on inpatient rehab is excellent 7. Anticipated functional outcomes upon discharge from inpatient rehab are supervision  with PT, supervision and min assist with OT, supervision with SLP. 8. Estimated rehab length of stay to reach the above functional goals is: 10-12 days 9. Does the patient have adequate social supports and living environment to accommodate these discharge functional goals? Yes 10. Anticipated D/C setting: Home 11. Anticipated post D/C treatments: HH therapy and Outpatient therapy 12. Overall Rehab/Functional Prognosis: excellent  RECOMMENDATIONS: This patient's condition is appropriate for continued rehabilitative care in the following setting: CIR Patient has agreed to participate in recommended program. Yes Note that insurance prior authorization may be required for reimbursement for recommended care.  Comment: Rehab Admissions Coordinator to follow up.  Thanks,  Ranelle Oyster, MD, [place] [facility]     02/16/2015

## 2015-02-16 NOTE — Progress Notes (Signed)
Rehab Admissions Coordinator Note:  Patient was screened by Clois DupesBoyette, Naren Benally Godwin for appropriateness for an Inpatient Acute Rehab Consult. Per PT recommendation.  At this time, we are recommending Inpatient Rehab consult.  Clois DupesBoyette, Krystall Kruckenberg Godwin 02/16/2015, 12:31 PM  I can be reached at [phone].

## 2015-02-16 NOTE — Progress Notes (Signed)
Pt began coughing as he was eating breakfast and continues to have a persistent cough since. Will continue to monitor.

## 2015-02-17 NOTE — Evaluation (Signed)
Speech Language Pathology Evaluation Patient Details Name: [name] MRN: [id number] DOB: 1935-11-02 Today's Date: 02/17/2015 Time: 1203-1216 SLP Time Calculation (min) (ACUTE ONLY): 13 min  Problem List:  Patient Active Problem List   Diagnosis Date Noted  . Subdural hematoma 02/12/2015  . Ataxia 06/02/2013  . Falling 06/02/2013  . HTN (hypertension) 06/02/2013  . Hyperlipidemia 06/02/2013   Past Medical History:  Past Medical History  Diagnosis Date  . Hypertension   . Hyperlipidemia   . Right ureteral stone   . Hematuria     ureteral stone and stent  . Chronic kidney disease     left nephrolithiasis   Past Surgical History:  Past Surgical History  Procedure Laterality Date  . Cystoscopy w/ ureteral stent placement  01-02-11    right  . Extracorporeal shock wave lithotripsy  02-05-11    right  . Cataract extraction w/ intraocular lens  implant, bilateral    . Cystoscopy with ureteroscopy  09/17/2011    Procedure: CYSTOSCOPY WITH URETEROSCOPY;  Surgeon: Milford Cage, MD;  Location: [facility];  Service: Urology;  Laterality: Right;  CYSTOSCOPY, RIGHT URETEROSCOPY WITH LASER LITHO AND STENT   . Craniotomy Right 02/12/2015    Procedure: [name] for HEMATOMA EVACUATION SUBDURAL;  Surgeon: Hilda Lias, MD;  Location: [facility]URO [facility]S;  Service: Neurosurgery;  Laterality: Right;   HPI:  80 year old who speaks limited English, admitted with question of leg weakness. Pt fell 2 months ago. MRI showed a large right subdural hematoma with shift to the left. Pt underwent craniotomy with burr holes for evacuation 02/12/15. PAST MEDICAL HISTORY:kidney stone, hypertension, hyperlipidemia. Pt began coughing as he was eating breakfast and continues to have a persistent cough since per 88M nurse and BSE was ordered.   Assessment / Plan / Recommendation Clinical Impression  Speech-language-cognition assessed with assist of pt's sister (wife assisted some; pt  comprehends limited [place]). Family reports mild dysarthria, however report speech is intelligible. He demonstrated decreased emergent awareness of increased rate of eating/drinking during bedside swallow and required cueing. Verbal problem solving, orientation, attention appeared functional. Pt/family did not report significant cognitive changes. He appears functional for acute setting and may benefit from evaluation at next venue of care for further assessment of memory. SLP educated pt/wife/sister on speech strategies to implement for increase speech precision. No f/u needed on acute care for speech/cognition. Will see briefly for swallow.    SLP Assessment  All further Speech Lanaguage Pathology  needs can be addressed in the next venue of care    Follow Up Recommendations   (CIR vs home healty)    Frequency and Duration min 2x/week      Pertinent Vitals/Pain Pain Assessment: No/denies pain   SLP Goals     SLP Evaluation Prior Functioning  Cognitive/Linguistic Baseline: Within functional limits Type of Home: House  Lives With: Spouse Available Help at Discharge: Family;Available 24 hours/day Vocation: Retired   IT consultant  Overall Cognitive Status: Within Functional Limits for tasks assessed (no report of difficulty when asked re: cognition/memory) Arousal/Alertness: Awake/alert Orientation Level: Oriented X4 Attention: Sustained Sustained Attention: Appears intact Memory: Appears intact Awareness: Impaired (appeared impulsive) Awareness Impairment: Emergent impairment Problem Solving: Appears intact (for verbal) Safety/Judgment:  (questionable)    Comprehension  Auditory Comprehension Overall Auditory Comprehension: Appears within functional limits for tasks assessed Visual Recognition/Discrimination Discrimination: Not tested Reading Comprehension Reading Status: Not tested    Expression Expression Primary Mode of Expression: Verbal Verbal Expression Overall  Verbal Expression: Appears  within functional limits for tasks assessed Pragmatics: No impairment Written Expression Dominant Hand: Right Written Expression: Not tested   Oral / Motor Oral Motor/Sensory Function Overall Oral Motor/Sensory Function: Appears within functional limits for tasks assessed Motor Speech Overall Motor Speech:  (family notes mild dysarthria) Respiration: Within functional limits Phonation: Normal Resonance: Within functional limits Articulation: Within functional limitis Intelligibility:  (intelligible/family reports mild dysarthria) Motor Planning: Witnin functional limits   GO     Royce MacadamiaLitaker, Catera Hankins [name] 02/17/2015, 2:41 PM   Breck CoonsLisa [name] Sophia Cubero M.Ed [employer] Pager [phone]

## 2015-02-17 NOTE — Evaluation (Signed)
Occupational Therapy Evaluation Patient Details Name: [name] AreasBansidhar Cantrell MRN: [id number] DOB: 12-Jun-1935 Today's Date: 02/17/2015    History of Present Illness Patient is a 80 y/o male admitted s/p fall when in [place] a month ago and had some memory decline, then recent fall at home and had LE weakness, unable to ambulate; an MRI was done and showed a large subdural hematoma in the right side with shift to the left.  Now s/p two burr holes with drains done 02/14/15.   Clinical Impression   Patient independent PTA. Patient currently requires up to mod assist for sit<>stands and functional mobility. Patient will benefit from acute OT to increase overall independence in the areas of ADLs, functional mobility, and overall safety in order to safely discharge to venue listed below.     Follow Up Recommendations  CIR;Supervision/Assistance - 24 hour    Equipment Recommendations  Other (comment) (TBD next venue of care )    Recommendations for Other Services Rehab consult     Precautions / Restrictions Precautions Precautions: Fall Restrictions Weight Bearing Restrictions: No      Mobility Bed Mobility Overal bed mobility: Needs Assistance Bed Mobility: Rolling;Sidelying to Sit Rolling: Min guard Sidelying to sit: Min assist       General bed mobility comments: assist with LEs and to come to sitting.   Transfers Overall transfer level: Needs assistance Equipment used: Rolling walker (2 wheeled) Transfers: Sit to/from [employer]GI CorporationStand;Stand Pivot Transfers Sit to Stand: Mod assist Stand pivot transfers: Mod assist       General transfer comment: Cues for hand placement and overall safety.    Balance Overall balance assessment: History of Falls;Needs assistance Sitting-balance support: No upper extremity supported;Feet supported Sitting balance-Leahy Scale: Fair     Standing balance support: Bilateral upper extremity supported;During functional activity Standing balance-Leahy Scale:  Poor    ADL Overall ADL's : Needs assistance/impaired Eating/Feeding: Set up;Sitting   Grooming: Set up;Sitting   Upper Body Bathing: Min guard;Sitting   Lower Body Bathing: Moderate assistance;Sit to/from stand;Cueing for safety (mod assist for sit<>stand)   Upper Body Dressing : Min guard;Sitting   Lower Body Dressing: Moderate assistance;Cueing for safety;Sit to/from stand (mod assist for sit<>stand)   Toilet Transfer: Moderate assistance;RW;BSC;Ambulation  General ADL Comments: Patient mod assist for sit<>stands and stand pivot transfers at this time. Patient prefers language other than english, but is able to communicate minimally in english. Family is supportive, but unable to provide 24/7 assistance post discharge at this time. Patient will benefit from CIR prior to home. Patient is able to reach BLEs for LB ADLs, but requires multiple cues to carryout tasks. Cognition vs language barrier?      Vision Additional Comments: Patient reports blurring in vision. To be further tested in functional context          Pertinent Vitals/Pain Pain Assessment: No/denies pain     Hand Dominance Right   Extremity/Trunk Assessment Upper Extremity Assessment Upper Extremity Assessment: Overall WFL for tasks assessed   Lower Extremity Assessment Lower Extremity Assessment: Defer to PT evaluation   Cervical / Trunk Assessment Cervical / Trunk Assessment: Normal   Communication Communication Communication: Prefers language other than English   Cognition Arousal/Alertness: Awake/alert Behavior During Therapy: WFL for tasks assessed/performed Overall Cognitive Status: Difficult to assess (family reports some change in memory, to be further assessed)             Home Living Family/patient expects to be discharged to:: Private residence Living Arrangements: Spouse/significant other Available Help at  Discharge: Family;Available 24 hours/day Type of Home: House Home Access: Level  entry     Home Layout: One level     Bathroom Shower/Tub: Tub/shower unit;Door   [employer] Toilet: Standard     Home Equipment: Environmental consultant - 2 wheels          Prior Functioning/Environment Level of Independence: Independent      OT Diagnosis: Generalized weakness;Disturbance of vision;Cognitive deficits   OT Problem List: Decreased strength;Decreased activity tolerance;Impaired balance (sitting and/or standing);Impaired vision/perception;Decreased safety awareness;Decreased knowledge of use of DME or AE;Decreased knowledge of precautions;Pain   OT Treatment/Interventions: Therapeutic exercise;Self-care/ADL training;Energy conservation;DME and/or AE instruction;Therapeutic activities;Patient/family education;Balance training    OT Goals(Current goals can be found in the care plan section) Acute Rehab OT Goals Patient Stated Goal: To go to rehab OT Goal Formulation: With patient/family Time For Goal Achievement: 02/24/15 Potential to Achieve Goals: Good ADL Goals Pt Will Perform Grooming: standing;with min guard assist Pt Will Transfer to Toilet: with min assist;bedside commode;ambulating Pt Will Perform Tub/Shower Transfer: Tub transfer;tub bench;rolling walker;ambulating;with min assist Additional ADL Goal #1: Patient will follow multiple step commands consistently 4/5 times during therapy session  OT Frequency: Min 2X/week   Barriers to D/C: Decreased caregiver support          End of Session Equipment Utilized During Treatment: Gait belt;Rolling walker Nurse Communication: Mobility status;Other (comment) (recommendation for [facility] transfers)  Activity Tolerance: Patient tolerated treatment well Patient left: in chair;with call bell/phone within reach;with family/visitor present   Time: 1610-9604 OT Time Calculation (min): 28 min Charges:  OT General Charges $OT Visit: 1 Procedure OT Evaluation $Initial OT Evaluation Tier I: 1 Procedure OT Treatments $Self Care/Home  Management : 8-22 mins  Aadvik Roker , MS, OTR/L, CLT Pager: [phone]  02/17/2015, 10:53 AM

## 2015-02-17 NOTE — Progress Notes (Signed)
Patient ID: [name], male   DOB: 1935-10-24, 80 y.o.   MRN: [id number] Doing well. Pt/ot helping. No weakness

## 2015-02-17 NOTE — Progress Notes (Signed)
Rehab admissions - Evaluated for possible admission.  I met with patient and his wife.  They speak only a little English.  Sister in the room speaks English and comes from [place].  Patient has a niece who will come today at 5 pm and they have my card and rehab booklets.  Sister is concerned about the co pays with [employer] medicare for inpatient rehab.  Patient may need to go outside to a SNF to avoid insurance co pays.  I will await call from niece.  Call me for questions.  #[phone] 

## 2015-02-17 NOTE — Progress Notes (Signed)
Physical Therapy Treatment Patient Details Name: Thurl Boen MRN: [id number] DOB: 09/11/1935 Today's Date: 02/17/2015    History of Present Illness Patient is a 80 y/o male admitted s/p fall when in [place] a month ago and had some memory decline, then recent fall at home and had LE weakness, unable to ambulate; an MRI was done and showed a large subdural hematoma in the right side with shift to the left.  Now s/p two burr holes with drains done 02/14/15.    PT Comments    Continues to make progress towards physical therapy goals, ambulating up to 85 feet today with intermittent min assist for balance and walker control. Tolerated exercises well and is eager to work with therapy. Patient will continue to benefit from skilled physical therapy services to further improve independence with functional mobility.   Follow Up Recommendations  CIR (If cannot afford co-pay may require SNF for continued rehab)     Equipment Recommendations  None recommended by PT    Recommendations for Other Services       Precautions / Restrictions Precautions Precautions: Fall Restrictions Weight Bearing Restrictions: No    Mobility  Bed Mobility Overal bed mobility: Needs Assistance Bed Mobility: Supine to Sit   Sidelying to sit: Min assist       General bed mobility comments: Min assist with PT providing truncal support. Some posterior leaning. Able to scoot to edge of bed with notable effort.  Transfers Overall transfer level: Needs assistance Equipment used: Rolling walker (2 wheeled) Transfers: Sit to/from Stand Sit to Stand: Min assist         General transfer comment: Min assist for balance to stand x2 from lowest bed setting. leaning posteriorly with some loss of balance requiring assist to regain. Cues for hand placement prior to rising.  Ambulation/Gait Ambulation/Gait assistance: Min assist Ambulation Distance (Feet): 85 Feet Assistive device: Rolling walker (2 wheeled) Gait  Pattern/deviations: Step-through pattern;Decreased stride length;Decreased step length - left;Narrow base of support Gait velocity: decreased   General Gait Details: VC for forward gaze. gait training focused on symmetry of gait with cues for greater Lt step length which he responded to well.  Min assist for walker control with turns and for patient to stay within the RW for stability at times.   Stairs            Wheelchair Mobility    Modified Rankin (Stroke Patients Only)       Balance                                    Cognition Arousal/Alertness: Awake/alert Behavior During Therapy: WFL for tasks assessed/performed Overall Cognitive Status: Difficult to assess                      Exercises General Exercises - Lower Extremity Ankle Circles/Pumps: Both;10 reps;Seated;AAROM Long Arc Quad: Strengthening;Both;10 reps;Seated Hip Flexion/Marching: Strengthening;Both;10 reps;Seated    General Comments        Pertinent Vitals/Pain Pain Assessment: No/denies pain    Home Living                      Prior Function            PT Goals (current goals can now be found in the care plan section) Acute Rehab PT Goals PT Goal Formulation: With patient/family Time For Goal Achievement: 03/02/15 Potential to Achieve  Goals: Good Progress towards PT goals: Progressing toward goals    Frequency  Min 4X/week    PT Plan Current plan remains appropriate    Co-evaluation             End of Session Equipment Utilized During Treatment: Gait belt Activity Tolerance: Patient tolerated treatment well Patient left: in bed;with call bell/phone within reach;with bed alarm set;with family/visitor present     Time: 1721-1740 PT Time Calculation (min) (ACUTE ONLY): 19 min  Charges:  $Gait Training: 8-22 mins                    G Codes:      Berton MountBarbour, Bannie Lobban S 02/17/2015, 6:55 PM Sunday SpillersLogan Secor EmmetBarbour, [place] CarolinaPT [phone]

## 2015-02-17 NOTE — Clinical Social Work Note (Signed)
Clinical Social Worker has assessed patient and pt's family. Full psychosocial assessment to follow.   Derenda FennelBashira Thuy Atilano, MSW, LCSWA [phone] 02/17/2015 4:25 PM

## 2015-02-17 NOTE — Evaluation (Addendum)
Clinical/Bedside Swallow Evaluation Patient Details  Name: [name] AreasBansidhar Kovalenko MRN: [id number] Date of Birth: [birth year]  Today's Date: 02/17/2015 Time: SLP Start Time (ACUTE ONLY): 1147 SLP Stop Time (ACUTE ONLY): 1203 SLP Time Calculation (min) (ACUTE ONLY): 16 min  Past Medical History:  Past Medical History  Diagnosis Date  . Hypertension   . Hyperlipidemia   . Right ureteral stone   . Hematuria     ureteral stone and stent  . Chronic kidney disease     left nephrolithiasis   Past Surgical History:  Past Surgical History  Procedure Laterality Date  . Cystoscopy w/ ureteral stent placement  01-02-11    right  . Extracorporeal shock wave lithotripsy  02-05-11    right  . Cataract extraction w/ intraocular lens  implant, bilateral    . Cystoscopy with ureteroscopy  09/17/2011    Procedure: CYSTOSCOPY WITH URETEROSCOPY;  Surgeon: Milford Cageaniel Young Woodruff, MD;  [facility];  Service: Urology;  Laterality: Right;  CYSTOSCOPY, RIGHT URETEROSCOPY WITH LASER LITHO AND STENT   . Craniotomy Right 02/12/2015    Procedure: Ezekiel InaBurr Holes for HEMATOMA EVACUATION SUBDURAL;  Surgeon: Hilda LiasErnesto Botero, MD;  [facility];  Service: Neurosurgery;  Laterality: Right;   HPI:  80 year old admitted with question of leg weakness. Pt fell 2 months ago. MRI showed a large right subdural hematoma with shift to the left. Pt underwent craniotomy with burr holes for evacuation 02/12/15. PAST MEDICAL HISTORY:kidney stone, hypertension, hyperlipidemia. Pt began coughing as he was eating breakfast and continues to have a persistent cough since per 8M nurse and BSE was ordered.   Assessment / Plan / Recommendation Clinical Impression  Pt exhibited immediate cough following thin liquids when large, consecutive sips consumed. He required mod-max verbal/visual/tactile cues to decrease rate and ensure small, single sips mitigating s/s aspiration. Oral mastication and transit functional with solid.  Discussed increased risk with straws; pt prefers using cup. Recommend continue regular diet texture, thin liquids with strict adherence of precautions for small sips/bites (pt impulsive) slow rate, no straws and single pills with thin liquid. Will follow briefly for safety and compliance with swallow strategies.    Aspiration Risk  Moderate    Diet Recommendation Regular;Thin liquid   Liquid Administration via: Cup;No straw Medication Administration: Whole meds with liquid Supervision: Patient able to self feed;Full supervision/cueing for compensatory strategies Compensations: Slow rate;Small sips/bites Postural Changes and/or Swallow Maneuvers: Seated upright 90 degrees    Other  Recommendations Oral Care Recommendations: Oral care BID   Follow Up Recommendations   (TBD)    Frequency and Duration min 2x/week  2 weeks   Pertinent Vitals/Pain none         Swallow Study          Oral/Motor/Sensory Function Overall Oral Motor/Sensory Function: Appears within functional limits for tasks assessed   Ice Chips Ice chips: Not tested   Thin Liquid Thin Liquid: Impaired Presentation: Cup;Straw Pharyngeal  Phase Impairments: Cough - Immediate;Throat Clearing - Immediate    Nectar Thick Nectar Thick Liquid: Not tested   Honey Thick Honey Thick Liquid: Not tested   Puree Puree: Within functional limits   Solid   GO    Solid: Impaired Pharyngeal Phase Impairments: Throat Clearing - Delayed       Royce MacadamiaLitaker, Shasha Buchbinder [name] 02/17/2015,2:23 PM   Breck CoonsLisa [name] Lonell FaceLitaker M.Ed [employer]-SLP Pager [phone]

## 2015-02-18 MED ORDER — LEVETIRACETAM 500 MG PO TABS
500.0000 mg | ORAL_TABLET | Freq: Two times a day (BID) | ORAL | Status: DC
  Administered 2015-02-18: 500 mg via ORAL
  Filled 2015-02-18: qty 1

## 2015-02-18 NOTE — Progress Notes (Signed)
Speech Language Pathology Treatment: Dysphagia  Patient Details Name: [name] MRN: [id number] DOB: 1935/10/18 Today's Date: 02/18/2015 Time: 0768-0881 SLP Time Calculation (min) (ACUTE ONLY): 11 min  Assessment / Plan / Recommendation Clinical Impression  Dysphagia treatment with recommendations following swallow assessment yesterday. Pt given min reminders for small sips and slow rate prior to thin liquid cup sips which he demonstrated with mod independence-min verbal/visual cues. One slight delayed cough. Recommend continue regular diet, thin liquids and encouragement to increase intake (pt stating he isn't hungry and did not eat breakfast). No f/u needed; will d/c ST.   HPI HPI: [name] who speaks limited English, admitted with question of leg weakness. Pt fell 2 months ago. MRI showed a large right subdural hematoma with shift to the left. Pt underwent craniotomy with burr holes for evacuation 02/12/15. PAST MEDICAL HISTORY:kidney stone, hypertension, hyperlipidemia. Pt began coughing as he was eating breakfast and continues to have a persistent cough since per 30M nurse and BSE was ordered.   Pertinent Vitals Pain Assessment: No/denies pain  SLP Plan  All goals met;Discharge SLP treatment due to (comment)    Recommendations Diet recommendations: Regular Liquids provided via: Cup;No straw Medication Administration: Whole meds with liquid Supervision: Patient able to self feed;Full supervision/cueing for compensatory strategies Compensations: Slow rate;Small sips/bites Postural Changes and/or Swallow Maneuvers: Seated upright 90 degrees              Oral Care Recommendations: Oral care BID Follow up Recommendations: None Plan: All goals met;Discharge SLP treatment due to (comment)    GO     Houston Siren 02/18/2015, 11:15 AM  Orbie Pyo Colvin Caroli.Ed [employer] [phone]

## 2015-02-18 NOTE — Progress Notes (Signed)
Patient ID: [name], male   DOB: Mar 28, 1935, 80 y.o.   MRN: [id number] Stable. Wound dry. Waiting for placement. PATIENT DID NOT HAVE A CVA

## 2015-02-18 NOTE — Progress Notes (Signed)
UR complete.  Torrez Renfroe RN, MSN 

## 2015-02-18 NOTE — Progress Notes (Signed)
D/C orders received. Pt and family notified. IVs removed. Pt dressed and belongings packed. [facility]. [facility] to transport pt to SNF. Will continue to monitor.

## 2015-02-18 NOTE — Clinical Social Work Placement (Signed)
Clinical Social Work Department CLINICAL SOCIAL WORK PLACEMENT NOTE 02/18/2015  Patient:  [name],Nicholas  Account Number:  [id number] Admit date:  02/12/2015  Clinical Social Worker:  Gwynne EdingerYSHEKA Emilliano Dilworth, LCSWA  Date/time:  02/18/2015 04:17 PM  Clinical Social Work is seeking post-discharge placement for this patient at the following level of care:   SKILLED NURSING   (*CSW will update this form in Epic as items are completed)   02/17/2015  [facility]/family provided with [facility] Department of Clinical Social Work's list of facilities offering this level of care within the geographic area requested by the patient (or if unable, by the patient's family).  02/17/2015  Patient/family informed of their freedom to choose among providers that offer the needed level of care, that participate in Medicare, Medicaid or managed care program needed by the patient, have an available bed and are willing to accept the patient.  02/17/2015  Patient/family informed of [facility]' ownership interest in [facility], as well as of the fact that they are under no obligation to receive care at this facility.  PASARR submitted to EDS on 02/17/2015 PASARR number received on 02/17/2015  FL2 transmitted to all facilities in geographic area requested by pt/family on  02/17/2015 FL2 transmitted to all facilities within larger geographic area on 02/17/2015  Patient informed that his/her managed care company has contracts with or will negotiate with  certain facilities, including the following:     Patient/family informed of bed offers received:  02/18/2015 [facility] Physician recommends and patient chooses bed at    [facility] on  02/18/2015 Patient to be transferred to facility by [facility] Patient and family notified of transfer on 02/18/2015 Name of family member notified:  [name],[name]  The following  physician request were entered in Epic:   Additional Comments: CSW used interpreting services [phone]   Gwynne EdingerDysheka Erastus Bartolomei, MSW, Theresia MajorsLCSWA [phone]

## 2015-02-18 NOTE — Discharge Summary (Signed)
Physician Discharge Summary  Patient ID: [name] MRN: [id number] DOB/AGE: 04-24-1935 [age] y.o.  Admit date: 02/12/2015 Discharge date: 02/18/2015  Admission Diagnoses:subdural hematoma  Discharge Diagnoses:  Active Problems:   Subdural hematoma   Discharged Condition: no weaknes    Hospital Course: surgery  Consults:none  Significant Diagnostic Studies: mri  Treatments: burr holes for evacuation of sdh  Discharge Exam: Blood pressure 141/85, pulse 95, temperature 98.2 F (36.8 C), temperature source Oral, resp. rate 20, height 5\' 5"  (1.651 m), weight 70.9 kg (156 lb 4.9 oz), SpO2 97 %.wounds dry. No weakness  Disposition: SNF  To see me in 4 weeks     Medication List    ASK your doctor about these medications        amLODipine 5 MG tablet  Commonly known as:  NORVASC  Take 5 mg by mouth every morning.     aspirin EC 81 MG tablet  Take 81 mg by mouth daily.     HYDROcodone-acetaminophen 5-325 MG per tablet  Commonly known as:  NORCO/VICODIN  Take 1 tablet by mouth every 6 (six) hours as needed.     levothyroxine 25 MCG tablet  Commonly known as:  SYNTHROID, LEVOTHROID  Take 1 tablet (25 mcg total) by mouth daily before breakfast.     lidocaine 5 %  Commonly known as:  LIDODERM  Place 1 patch onto the skin daily. Remove & Discard patch within 12 hours or as directed by MD     MULTIVITAMIN PO  Take 1 tablet by mouth daily.     simvastatin 20 MG tablet  Commonly known as:  ZOCOR  Take 20 mg by mouth at bedtime.         Signed: Karn CassisBOTERO,Shayda Kalka M 02/18/2015, 3:26 PM

## 2015-02-18 NOTE — Care Management Note (Signed)
    Page 1 of 1   02/18/2015     2:04:48 PM CARE MANAGEMENT NOTE 02/18/2015  Patient:  [name],Purvis   Account Number:  [id number]  Date Initiated:  02/14/2015  Documentation initiated by:  Carlyle LipaBRYSON,MICHELLE  Subjective/Objective Assessment:   presented w/ bleed; taken to OR for evacuation; to ICU postop with 2 external drains in place     Action/Plan:   will remain in ICU until drains are removed   Anticipated DC Date:  02/21/2015   Anticipated DC Plan:  HOME W HOME HEALTH SERVICES         Choice offered to / List presented to:             Status of service:  In process, will continue to follow Medicare Important Message given?  YES (If response is "NO", the following Medicare IM given date fields will be blank) Date Medicare IM given:  02/18/2015 Medicare IM given by:  Elmer BalesOBARGE,Annalese Stiner Date Additional Medicare IM given:   Additional Medicare IM given by:    Discharge Disposition:    Per UR Regulation:  Reviewed for med. necessity/level of care/duration of stay  If discussed at Long Length of Stay Meetings, dates discussed:   02/17/2015    Comments:  02/18/15 1125 Elmer Balesourtney Treana Lacour RN, MSN, CM- Medicare IM letteer provided

## 2015-02-18 NOTE — Progress Notes (Signed)
Rehab admissions - I spoke with patient's niece this am.  Family has decided that they want to pursue SNF placement.  The co pays for inpatient rehab are $345 for days 1-5 and family do not want to incur this cost for the patient.  I have left a message with the social worker.  Call me for questions.  #[phone]

## 2015-02-19 NOTE — Clinical Social Work Psychosocial (Signed)
Clinical Social Work Department BRIEF PSYCHOSOCIAL ASSESSMENT 02/17/2015  Patient:  [name],[name]     Account Number:  [id number]     Admit date:  02/12/2015  Clinical Social Worker:  ,, CLINICAL SOCIAL WORKER  Date/Time:  02/17/2015 04:18 PM  Referred by:  Physician  Date Referred:  02/17/2015 Referred for  SNF Placement   Other Referral:   Interview type:  Other - See comment Other interview type:   CSW met with patient's family (wife, sister and niece) present at bedside.    PSYCHOSOCIAL DATA Living Status:  WIFE Admitted from facility:  n/a Level of care:  n/a Primary support name:  [name] Jabs Primary support relationship to patient:  SPOUSE Degree of support available:   Strong    CURRENT CONCERNS Current Concerns  Post-Acute Placement   Other Concerns:    SOCIAL WORK ASSESSMENT / PLAN Clinical Social Worker met with pt and pt's family in reference to post acute placement for SNF. Pt's niece served as translator during assessment. CSW introduced CSW role and SNF process. CSW also reviewed and provided SNF list.   Pt's niece expressed pt's family would like placement into IP REHAB however currently awaiting additional information in regards to daily co-pays for CIR. Pt's niece reported depending on co-pay amount disposition plans are pending. Pt's niece reviewed SNF's from SNF list and prefers [facility] and then [facility]. Pt's niece also shared with CSW the barriers of placement within SNF's such as pt's wife having transportation and the language barrier overall.   Family made it clear that they are awaiting co-pay information before making final discharge dicussion. CSW will continue to follow pt and pt's family for continued support and to facilitate pt's discharge once medically stable.   Assessment/plan status:  Psychosocial Support/Ongoing Assessment of Needs Other assessment/ plan:   -FL-2 signed and on chart.  -CSW/family awaiting co-pay  information to determine discharge needs.  -CSW has contacted [facility] and awaiting bed offer.   Information/referral to community resources:   SNF list provided.    PATIENT'S/FAMILY'S RESPONSE TO PLAN OF CARE: Pt's alert and oriented however speaks little English. Pt's family supportive, pleasant and agreeable to SNF placement as an alternative plan to CIR. Pt and pt's family appreciated social work intervention.    , MSW, LCSWA [phone]   

## 2015-02-23 ENCOUNTER — Non-Acute Institutional Stay (SKILLED_NURSING_FACILITY): Payer: Medicare Other | Admitting: Internal Medicine

## 2015-02-23 ENCOUNTER — Encounter: Payer: Self-pay | Admitting: Internal Medicine

## 2015-02-23 DIAGNOSIS — E039 Hypothyroidism, unspecified: Secondary | ICD-10-CM | POA: Insufficient documentation

## 2015-02-23 DIAGNOSIS — E038 Other specified hypothyroidism: Secondary | ICD-10-CM

## 2015-02-23 DIAGNOSIS — E785 Hyperlipidemia, unspecified: Secondary | ICD-10-CM | POA: Diagnosis not present

## 2015-02-23 DIAGNOSIS — I10 Essential (primary) hypertension: Secondary | ICD-10-CM

## 2015-02-23 DIAGNOSIS — S065XAA Traumatic subdural hemorrhage with loss of consciousness status unknown, initial encounter: Secondary | ICD-10-CM

## 2015-02-23 DIAGNOSIS — E034 Atrophy of thyroid (acquired): Secondary | ICD-10-CM

## 2015-02-23 DIAGNOSIS — I6200 Nontraumatic subdural hemorrhage, unspecified: Secondary | ICD-10-CM | POA: Diagnosis not present

## 2015-02-23 DIAGNOSIS — S065X9A Traumatic subdural hemorrhage with loss of consciousness of unspecified duration, initial encounter: Secondary | ICD-10-CM

## 2015-02-23 NOTE — Assessment & Plan Note (Signed)
Controlled on norvac 5 mg

## 2015-02-23 NOTE — Assessment & Plan Note (Signed)
Continue zocor HS

## 2015-02-23 NOTE — Assessment & Plan Note (Signed)
Pt reported not taking;restarted on d/c

## 2015-02-23 NOTE — Assessment & Plan Note (Signed)
:   right Fronto-temporal-parietal-occipital acute on chronic SDH - Presented 02/12/2015 after being seen at the urgent care with left-sided weakness. By report patient with a recent fall approximate 2 months ago while visiting in [place] as well as a fall in the bathroom while at urgent care striking his head. MRI of the brain showed a large subdural hematoma right side with shift to the left. Underwent to burr hole procedures 1 frontal 1 parietal drainage of chronic subdural hematoma insertion of 2 large drains 02/12/2015 per Dr. Jeral FruitBotero. Maintained on Keppra for seizure prophylaxis

## 2015-02-23 NOTE — Progress Notes (Signed)
MRN: [id number] Name: [name]  Sex: male Age: 80 y.o. DOB: 10-Mar-1935  [facility] Facility/Room:111 Level Of Care: SNF Provider: Merrilee Seashore D Emergency Contacts: Extended Emergency Contact Information Primary Emergency Contact: Schrum,[name] Address: [address]          [place], [place] [address] [place] Home Phone: [phone] Relation: Spouse Secondary Emergency Contact: Taras,Vinesh  [place] Home Phone: [phone] Relation: Son  Code Status:FULL   Allergies: Review of patient's allergies indicates no known allergies.  Chief Complaint  Patient presents with  . New Admit To SNF    HPI: Patient is 80 y.o. male who who was treated for acute on chronic SDH with L side leg weakness who is admitted to SNF for OT/PT.  Past Medical History  Diagnosis Date  . Hypertension   . Hyperlipidemia   . Right ureteral stone   . Hematuria     ureteral stone and stent  . Chronic kidney disease     left nephrolithiasis    Past Surgical History  Procedure Laterality Date  . Cystoscopy w/ ureteral stent placement  01-02-11    right  . Extracorporeal shock wave lithotripsy  02-05-11    right  . Cataract extraction w/ intraocular lens  implant, bilateral    . Cystoscopy with ureteroscopy  09/17/2011    Procedure: CYSTOSCOPY WITH URETEROSCOPY;  Surgeon: Milford Cage, MD;  [facility];  Service: Urology;  Laterality: Right;  CYSTOSCOPY, RIGHT URETEROSCOPY WITH LASER LITHO AND STENT   . Craniotomy Right 02/12/2015    Procedure: [name] for HEMATOMA EVACUATION SUBDURAL;  Surgeon: Hilda Lias, MD;  Location: [facility] NEURO ORS;  Service: Neurosurgery;  Laterality: Right;      Medication List       This list is accurate as of: 02/23/15 11:59 PM.  Always use your most recent med list.               amLODipine 5 MG tablet  Commonly known as:  NORVASC  Take 5 mg by mouth every morning.      aspirin EC 81 MG tablet  Take 81 mg by mouth daily.     HYDROcodone-acetaminophen 5-325 MG per tablet  Commonly known as:  NORCO/VICODIN  Take 1 tablet by mouth every 6 (six) hours as needed.     levothyroxine 25 MCG tablet  Commonly known as:  SYNTHROID, LEVOTHROID  Take 1 tablet (25 mcg total) by mouth daily before breakfast.     lidocaine 5 %  Commonly known as:  LIDODERM  Place 1 patch onto the skin daily. Remove & Discard patch within 12 hours or as directed by MD     MULTIVITAMIN PO  Take 1 tablet by mouth daily.     simvastatin 20 MG tablet  Commonly known as:  ZOCOR  Take 20 mg by mouth at bedtime.        No orders of the defined types were placed in this encounter.    Immunization History  Administered Date(s) Administered  . Pneumococcal Polysaccharide-23 06/04/2013    History  Substance Use Topics  . Smoking status: Never Smoker   . Smokeless tobacco: Never Used  . Alcohol Use: No    Family history is noncontributory    Review of Systems  DATA OBTAINED: from  nurse, medical record GENERAL:  no fevers, fatigue, appetite changes SKIN: No itching, rash  EYES: No eye pain, redness, discharge EARS: No earache, tinnitus,  change in hearing NOSE: No congestion, drainage or bleeding  MOUTH/THROAT: No mouth or tooth pain, No sore throat RESPIRATORY: No cough, wheezing, SOB CARDIAC: No chest pain, palpitations, lower extremity edema  GI: No abdominal pain, No N/V/D or constipation, No heartburn or reflux  GU: No dysuria, frequency or urgency, or incontinence  MUSCULOSKELETAL: No unrelieved bone/joint pain NEUROLOGIC: No headache, dizziness or focal weakness PSYCHIATRIC: No overt anxiety or sadness, No behavior issue.   Filed Vitals:   02/23/15 1000  BP: 135/90  Pulse: 60  Temp: 97.2 F (36.2 C)  Resp: 18    Physical Exam  GENERAL APPEARANCE: Alert, conversant,  No acute distress.  SKIN: scalp sutures look clean HEAD: Normocephalic, atraumatic   EYES: Conjunctiva/lids clear. Pupils round, reactive. EOMs intact.  EARS: External exam WNL, canals clear. Hearing grossly normal.  NOSE: No deformity or discharge.  MOUTH/THROAT: Lips w/o lesions  RESPIRATORY: Breathing is even, unlabored. Lung sounds are clear   CARDIOVASCULAR: Heart RRR no murmurs, rubs or gallops. No peripheral edema.   GASTROINTESTINAL: Abdomen is soft, non-tender, not distended w/ normal bowel sounds. GENITOURINARY: Bladder non tender, not distended  MUSCULOSKELETAL: No abnormal joints or musculature NEUROLOGIC:  Cranial nerves 2-12 grossly intact. Moves all extremities  PSYCHIATRIC: Mood and affect appropriate to situation, no behavioral issues  Patient Active Problem List   Diagnosis Date Noted  . Hypothyroidism 02/23/2015  . Subdural hematoma 02/12/2015  . Ataxia 06/02/2013  . Falling 06/02/2013  . HTN (hypertension) 06/02/2013  . Hyperlipidemia 06/02/2013    CBC    Component Value Date/Time   WBC 11.0* 02/13/2015 0240   RBC 4.26 02/13/2015 0240   HGB 13.7 02/13/2015 0240   HCT 39.6 02/13/2015 0240   PLT 199 02/13/2015 0240   MCV 93.0 02/13/2015 0240   LYMPHSABS 2.9 02/12/2015 1625   MONOABS 0.7 02/12/2015 1625   EOSABS 0.1 02/12/2015 1625   BASOSABS 0.0 02/12/2015 1625    CMP     Component Value Date/Time   NA 140 02/13/2015 0240   K 4.2 02/13/2015 0240   CL 105 02/13/2015 0240   CO2 19 02/13/2015 0240   GLUCOSE 135* 02/13/2015 0240   BUN 9 02/13/2015 0240   CREATININE 0.76 02/13/2015 0240   CALCIUM 8.3* 02/13/2015 0240   PROT 7.8 02/12/2015 1625   ALBUMIN 4.5 02/12/2015 1625   AST 28 02/12/2015 1625   ALT 23 02/12/2015 1625   ALKPHOS 36* 02/12/2015 1625   BILITOT 1.0 02/12/2015 1625   GFRNONAA 86* 02/13/2015 0240   GFRAA >90 02/13/2015 0240    Assessment and Plan  Subdural hematoma : right Fronto-temporal-parietal-occipital acute on chronic SDH - Presented 02/12/2015 after being seen at the urgent care with left-sided  weakness. By report patient with a recent fall approximate 2 months ago while visiting in [place] as well as a fall in the bathroom while at urgent care striking his head. MRI of the brain showed a large subdural hematoma right side with shift to the left. Underwent to burr hole procedures 1 frontal 1 parietal drainage of chronic subdural hematoma insertion of 2 large drains 02/12/2015 per Dr. Jeral FruitBotero. Maintained on Keppra for seizure prophylaxis   HTN (hypertension) Controlled on norvac 5 mg   Hyperlipidemia Continue zocor HS   Hypothyroidism Pt reported not taking;restarted on d/c     Margit HanksALEXANDER, Jaely Silman D, MD

## 2015-02-26 ENCOUNTER — Encounter: Payer: Self-pay | Admitting: Internal Medicine

## 2015-03-09 ENCOUNTER — Non-Acute Institutional Stay (SKILLED_NURSING_FACILITY): Payer: Medicare Other | Admitting: Internal Medicine

## 2015-03-09 ENCOUNTER — Encounter: Payer: Self-pay | Admitting: Internal Medicine

## 2015-03-09 DIAGNOSIS — E785 Hyperlipidemia, unspecified: Secondary | ICD-10-CM

## 2015-03-09 DIAGNOSIS — E038 Other specified hypothyroidism: Secondary | ICD-10-CM | POA: Diagnosis not present

## 2015-03-09 DIAGNOSIS — E034 Atrophy of thyroid (acquired): Secondary | ICD-10-CM | POA: Diagnosis not present

## 2015-03-09 DIAGNOSIS — S065XAA Traumatic subdural hemorrhage with loss of consciousness status unknown, initial encounter: Secondary | ICD-10-CM

## 2015-03-09 DIAGNOSIS — I6200 Nontraumatic subdural hemorrhage, unspecified: Secondary | ICD-10-CM

## 2015-03-09 DIAGNOSIS — S065X9A Traumatic subdural hemorrhage with loss of consciousness of unspecified duration, initial encounter: Secondary | ICD-10-CM

## 2015-03-09 DIAGNOSIS — I10 Essential (primary) hypertension: Secondary | ICD-10-CM

## 2015-03-09 NOTE — Progress Notes (Signed)
MRN: [id number] Name: [name]  Sex: male Age: 80 y.o. DOB: Apr 16, 1935  PSC #: [name] Facility/Room: 111 Level Of Care: SNF Provider: Merrilee Seashore D Emergency Contacts: Extended Emergency Contact Information Primary Emergency Contact: NicholasSulochana Address: [address]          [place], [place] [address] [place] Home Phone: [phone] Relation: Spouse Secondary Emergency Contact: [name]  [place] Home Phone: [phone] Relation: Son  Code Status:FULL   Allergies: Review of patient's allergies indicates no known allergies.  Chief Complaint  Patient presents with  . Discharge Note    HPI: Patient is 80 y.o. male who was admitted to SNF for OT/PT after ho  Past Medical History  Diagnosis Date  . Hypertension   . Hyperlipidemia   . Right ureteral stone   . Hematuria     ureteral stone and stent  . Chronic kidney disease     left nephrolithiasis    Past Surgical History  Procedure Laterality Date  . Cystoscopy w/ ureteral stent placement  01-02-11    right  . Extracorporeal shock wave lithotripsy  02-05-11    right  . Cataract extraction w/ intraocular lens  implant, bilateral    . Cystoscopy with ureteroscopy  09/17/2011    Procedure: CYSTOSCOPY WITH URETEROSCOPY;  Surgeon: Milford Cage, MD;  Location[facility];  Service: Urology;  Laterality: Right;  CYSTOSCOPY, RIGHT URETEROSCOPY WITH LASER LITHO AND STENT   . Craniotomy Right 02/12/2015    Procedure: [name] for HEMATOMA EVACUATION SUBDURAL;  Surgeon: Hilda Lias, MD;  Location: [facility] NEURO ORS;  Service: Neurosurgery;  Laterality: Right;      Medication List       This list is accurate as of: 03/09/15 11:32 AM.  Always use your most recent med list.               amLODipine 5 MG tablet  Commonly known as:  NORVASC  Take 5 mg by mouth every morning.     aspirin EC 81 MG tablet  Take 81 mg by mouth daily.     HYDROcodone-acetaminophen 5-325 MG per tablet  Commonly known as:  NORCO/VICODIN  Take 1 tablet by mouth every 6 (six) hours as needed.     levothyroxine 25 MCG tablet  Commonly known as:  SYNTHROID, LEVOTHROID  Take 1 tablet (25 mcg total) by mouth daily before breakfast.     simvastatin 20 MG tablet  Commonly known as:  ZOCOR  Take 20 mg by mouth at bedtime.        No orders of the defined types were placed in this encounter.    Immunization History  Administered Date(s) Administered  . Pneumococcal Polysaccharide-23 06/04/2013    History  Substance Use Topics  . Smoking status: Never Smoker   . Smokeless tobacco: Never Used  . Alcohol Use: No    Filed Vitals:   03/09/15 1111  BP: 130/72  Pulse: 52  Temp: 97.2 F (36.2 C)  Resp: 20    Physical Exam  GENERAL APPEARANCE: Alert, conversant. No acute distress.  HEENT: Unremarkable. RESPIRATORY: Breathing is even, unlabored. Lung sounds are clear   CARDIOVASCULAR: Heart RRR no murmurs, rubs or gallops. No peripheral edema.  GASTROINTESTINAL: Abdomen is soft, non-tender, not distended w/ normal bowel sounds.  NEUROLOGIC: Cranial nerves 2-12 grossly intact. Moves all extremities  Patient Active Problem List   Diagnosis Date Noted  . Hypothyroidism 02/23/2015  . Subdural hematoma 02/12/2015  .  Ataxia 06/02/2013  . Falling 06/02/2013  . HTN (hypertension) 06/02/2013  . Hyperlipidemia 06/02/2013    CBC    Component Value Date/Time   WBC 11.0* 02/13/2015 0240   RBC 4.26 02/13/2015 0240   HGB 13.7 02/13/2015 0240   HCT 39.6 02/13/2015 0240   PLT 199 02/13/2015 0240   MCV 93.0 02/13/2015 0240   LYMPHSABS 2.9 02/12/2015 1625   MONOABS 0.7 02/12/2015 1625   EOSABS 0.1 02/12/2015 1625   BASOSABS 0.0 02/12/2015 1625    CMP     Component Value Date/Time   NA 140 02/13/2015 0240   K 4.2 02/13/2015 0240   CL 105 02/13/2015 0240   CO2 19 02/13/2015 0240   GLUCOSE 135* 02/13/2015 0240   BUN 9 02/13/2015  0240   CREATININE 0.76 02/13/2015 0240   CALCIUM 8.3* 02/13/2015 0240   PROT 7.8 02/12/2015 1625   ALBUMIN 4.5 02/12/2015 1625   AST 28 02/12/2015 1625   ALT 23 02/12/2015 1625   ALKPHOS 36* 02/12/2015 1625   BILITOT 1.0 02/12/2015 1625   GFRNONAA 86* 02/13/2015 0240   GFRAA >90 02/13/2015 0240    Assessment and Plan  Pt is d/c to home with HH/OT/PT/nursing. Pt needs no DMEs.  [name], [name] D, MD

## 2019-07-09 ENCOUNTER — Ambulatory Visit
Admission: RE | Admit: 2019-07-09 | Discharge: 2019-07-09 | Disposition: A | Discharge status: Home / Self Care | Payer: Medicare Other | Source: Ambulatory Visit | Attending: Family Medicine | Admitting: Family Medicine

## 2019-07-09 ENCOUNTER — Other Ambulatory Visit: Payer: Self-pay | Admitting: Family Medicine

## 2019-07-09 DIAGNOSIS — M7989 Other specified soft tissue disorders: Secondary | ICD-10-CM

## 2020-04-28 ENCOUNTER — Emergency Department (HOSPITAL_COMMUNITY)
Admission: EM | Admit: 2020-04-28 | Discharge: 2020-04-29 | Disposition: A | Discharge status: Home / Self Care | Payer: Medicare Other | Attending: Emergency Medicine | Admitting: Emergency Medicine

## 2020-04-28 ENCOUNTER — Encounter (HOSPITAL_COMMUNITY): Payer: Self-pay

## 2020-04-28 DIAGNOSIS — I10 Essential (primary) hypertension: Secondary | ICD-10-CM | POA: Diagnosis not present

## 2020-04-28 DIAGNOSIS — N200 Calculus of kidney: Secondary | ICD-10-CM | POA: Diagnosis not present

## 2020-04-28 DIAGNOSIS — R1084 Generalized abdominal pain: Secondary | ICD-10-CM | POA: Diagnosis present

## 2020-04-28 LAB — COMPREHENSIVE METABOLIC PANEL
ALT: 32 U/L (ref 0–44)
AST: 57 U/L — ABNORMAL HIGH (ref 15–41)
Albumin: 4.4 g/dL (ref 3.5–5.0)
Alkaline Phosphatase: 28 U/L — ABNORMAL LOW (ref 38–126)
Anion gap: 12 (ref 5–15)
BUN: 11 mg/dL (ref 8–23)
CO2: 20 mmol/L — ABNORMAL LOW (ref 22–32)
Calcium: 9.3 mg/dL (ref 8.9–10.3)
Chloride: 102 mmol/L (ref 98–111)
Creatinine, Ser: 1.18 mg/dL (ref 0.61–1.24)
GFR calc Af Amer: >60 mL/min (ref >60)
GFR calc non Af Amer: 57 mL/min — ABNORMAL LOW (ref >60)
Glucose, Bld: 120 mg/dL — ABNORMAL HIGH (ref 70–99)
Potassium: 4.3 mmol/L (ref 3.5–5.1)
Sodium: 134 mmol/L — ABNORMAL LOW (ref 135–145)
Total Bilirubin: 1.4 mg/dL — ABNORMAL HIGH (ref 0.3–1.2)
Total Protein: 7.1 g/dL (ref 6.5–8.1)

## 2020-04-28 LAB — URINALYSIS, ROUTINE W REFLEX MICROSCOPIC
Bacteria, UA: NONE SEEN
Bilirubin Urine: NEGATIVE
Glucose, UA: NEGATIVE mg/dL
Ketones, ur: NEGATIVE mg/dL
Nitrite: NEGATIVE
Protein, ur: NEGATIVE mg/dL
Specific Gravity, Urine: 1.004 — ABNORMAL LOW (ref 1.005–1.030)
pH: 6 (ref 5.0–8.0)

## 2020-04-28 LAB — CBC
HCT: 45.7 % (ref 39.0–52.0)
Hemoglobin: 15.5 g/dL (ref 13.0–17.0)
MCH: 32 pg (ref 26.0–34.0)
MCHC: 33.9 g/dL (ref 30.0–36.0)
MCV: 94.2 fL (ref 80.0–100.0)
Platelets: 234 10*3/uL (ref 150–400)
RBC: 4.85 MIL/uL (ref 4.22–5.81)
RDW: 12.4 % (ref 11.5–15.5)
WBC: 8.1 10*3/uL (ref 4.0–10.5)
nRBC: 0 % (ref 0.0–0.2)

## 2020-04-28 LAB — LIPASE, BLOOD: Lipase: 32 U/L (ref 11–51)

## 2020-04-28 MED ORDER — SODIUM CHLORIDE 0.9% FLUSH: 3.0000 mL | Freq: Once | INTRAVENOUS | Status: DC

## 2020-04-28 NOTE — ED Triage Notes (Addendum)
Pt arrives to ED w/ c/o 8/10 abdominal pain. Pt states he has not had BM in over week. Pt denies n/v. Pt also states he had blood work done recently that revealed elevated wbc count. Pt also endorses hematuria.

## 2020-04-29 ENCOUNTER — Emergency Department (HOSPITAL_COMMUNITY): Payer: Medicare Other

## 2020-04-29 MED ORDER — CEPHALEXIN 500 MG PO CAPS
500.0000 mg | ORAL_CAPSULE | Freq: Four times a day (QID) | ORAL | 0 refills | Status: AC
Start: 2020-04-29 — End: 2020-05-06

## 2020-04-29 MED ORDER — IOHEXOL 300 MG/ML  SOLN
100.0000 mL | Freq: Once | INTRAMUSCULAR | Status: AC | PRN
  Administered 2020-04-29: 100 mL via INTRAVENOUS

## 2020-04-29 NOTE — Discharge Instructions (Addendum)
You were evaluated in the Emergency Department and after careful evaluation, we did not find any emergent condition requiring admission or further testing in the hospital.  Your exam/testing today is overall reassuring.  Your symptoms seem to be due to a kidney stone.  We discussed that the kidney stone is quite large and if you also have an infection in your urine this could become an emergency.  We ask that you call the urology office first thing in the morning to schedule an appointment.  We have messaged the on-call doctor who should be expecting your call.  Stop taking your trimethoprim antibiotic and start taking the new Keflex antibiotic.  Use Tylenol at home for discomfort.  Use over-the-counter MiraLAX for constipation.  Please return to the emergency department with any return of pain or fever.  Please return to the Emergency Department if you experience any worsening of your condition. Thank you for allowing [place] to be a part of your care.

## 2020-04-29 NOTE — ED Provider Notes (Signed)
[facility] Emergency Department Provider Note MRN:  [id number]  Arrival date & time: 04/29/20     Chief Complaint   Abdominal Pain   History of Present Illness   [name] is a 85 y.o. year-old male with a history of hypertension presenting to the ED with chief complaint of abdominal pain.  No bowel movements for 1 week.  Was recently started on antibiotics for UTI.  Moderate to severe diffuse abdominal pain today.  No passage of gas today.  Denies chest pain or shortness of breath, no fever, no headache, no dysuria, no other complaints.  History obtained with patient's family member at bedside who interprets.  Review of Systems  A complete 10 system review of systems was obtained and all systems are negative except as noted in the HPI and PMH.   Patient's Health History    Past Medical History:  Diagnosis Date  . Chronic kidney disease    left nephrolithiasis  . Hematuria    ureteral stone and stent  . Hyperlipidemia   . Hypertension   . Right ureteral stone     Past Surgical History:  Procedure Laterality Date  . CATARACT EXTRACTION W/ INTRAOCULAR LENS  IMPLANT, BILATERAL    . CRANIOTOMY Right 02/12/2015   Procedure: Haskell Flirt for HEMATOMA EVACUATION SUBDURAL;  Surgeon: Leeroy Cha, MD;  Location: [facility] NEURO ORS;  Service: Neurosurgery;  Laterality: Right;  . CYSTOSCOPY W/ URETERAL STENT PLACEMENT  01-02-11   right  . CYSTOSCOPY WITH URETEROSCOPY  09/17/2011   Procedure: CYSTOSCOPY WITH URETEROSCOPY;  Surgeon: Molli Hazard, MD;  Location: [facility];  Service: Urology;  Laterality: Right;  CYSTOSCOPY, RIGHT URETEROSCOPY WITH LASER LITHO AND STENT   . EXTRACORPOREAL SHOCK WAVE LITHOTRIPSY  02-05-11   right    No family history on file.  Social History   Socioeconomic History  . Marital status: Married    Spouse name: Not on file  . Number of children: Not on file  . Years of education: Not on file  . Highest  education level: Not on file  Occupational History  . Not on file  Tobacco Use  . Smoking status: Never Smoker  . Smokeless tobacco: Never Used  Substance and Sexual Activity  . Alcohol use: No  . Drug use: No  . Sexual activity: Not on file  Other Topics Concern  . Not on file  Social History Narrative  . Not on file   Social Determinants of Health   Financial Resource Strain:   . Difficulty of Paying Living Expenses:   Food Insecurity:   . Worried About Charity fundraiser in the Last Year:   . Arboriculturist in the Last Year:   Transportation Needs:   . Film/video editor (Medical):   [name] Lack of Transportation (Non-Medical):   Physical Activity:   . Days of Exercise per Week:   . Minutes of Exercise per Session:   Stress:   . Feeling of Stress :   Social Connections:   . Frequency of Communication with Friends and Family:   . Frequency of Social Gatherings with Friends and Family:   . Attends Religious Services:   . Active Member of Clubs or Organizations:   . Attends Archivist Meetings:   [name] Marital Status:   Intimate Partner Violence:   . Fear of Current or Ex-Partner:   . Emotionally Abused:   [name] Physically Abused:   . Sexually Abused:  Physical Exam   Vitals:   04/28/20 2246 04/29/20 0039  BP: (!) 121/97 (!) 126/91  Pulse: 93 91  Resp: 18 16  Temp:    SpO2: 97% 96%    CONSTITUTIONAL: Well-appearing, NAD NEURO:  Alert and oriented x 3, no focal deficits EYES:  eyes equal and reactive ENT/NECK:  no LAD, no JVD CARDIO: Regular rate, well-perfused, normal S1 and S2 PULM:  CTAB no wheezing or rhonchi GI/GU:  normal bowel sounds, nontender, mildly distended MSK/SPINE:  No gross deformities, no edema SKIN:  no rash, atraumatic PSYCH:  Appropriate speech and behavior  *Additional and/or pertinent findings included in MDM below  Diagnostic and Interventional Summary    EKG Interpretation  Date/Time:    Ventricular Rate:    PR  Interval:    QRS Duration:   QT Interval:    QTC Calculation:   R Axis:     Text Interpretation:        Labs Reviewed  COMPREHENSIVE METABOLIC PANEL - Abnormal; Notable for the following components:      Result Value   Sodium 134 (*)    CO2 20 (*)    Glucose, Bld 120 (*)    AST 57 (*)    Alkaline Phosphatase 28 (*)    Total Bilirubin 1.4 (*)    GFR calc non Af Amer 57 (*)    All other components within normal limits  URINALYSIS, ROUTINE W REFLEX MICROSCOPIC - Abnormal; Notable for the following components:   Color, Urine STRAW (*)    Specific Gravity, Urine 1.004 (*)    Hgb urine dipstick MODERATE (*)    Leukocytes,Ua SMALL (*)    All other components within normal limits  LIPASE, BLOOD  CBC    CT ABDOMEN PELVIS W CONTRAST  Final Result      Medications  sodium chloride flush (NS) 0.9 % injection 3 mL (has no administration in time range)  iohexol (OMNIPAQUE) 300 MG/ML solution 100 mL (100 mLs Intravenous Contrast Given 04/29/20 0229)     Procedures  /  Critical Care Procedures  ED Course and Medical Decision Making  I have reviewed the triage vital signs, the nursing notes, and pertinent available records from the EMR.  Listed above are laboratory and imaging tests that I personally ordered, reviewed, and interpreted and then considered in my medical decision making (see below for details).      CT to exclude small bowel obstruction.  Also considering more benign constipation, diverticulitis, less likely C. difficile given the absence of diarrhea though patient is having discomfort after antibiotics.  CT is without intestinal issue.  CT does reveal large kidney stone on the right, no significant hydronephrosis, there is some mild fat stranding suggestive of possible urethritis.  Urinalysis here is without signs of infection though patient has been taking antibiotics.  He has been pain-free during his ED visit, no fever, normal vital signs, definitely no signs of  systemic infection.  Patient needs prompt follow-up for this large kidney stone but currently without indication for further testing or admission.  I personally message the on-call urologist to help facilitate a rapid office visit.  Patient will call the office first thing in the morning.  Very strict return precautions for return of pain or fever.  Patient and patient's family member at bedside expressed understanding of this plan.  Elmer Sow. Pilar Plate, MD [facility] Emergency Medicine [facility] [email or web address]  Final Clinical Impressions(s) / ED Diagnoses     ICD-10-CM  1. Kidney stone  N20.0     ED Discharge Orders         Ordered    cephALEXin (KEFLEX) 500 MG capsule  4 times daily     Discontinue  Reprint     04/29/20 0302           Discharge Instructions Discussed with and Provided to Patient:     Discharge Instructions     You were evaluated in the Emergency Department and after careful evaluation, we did not find any emergent condition requiring admission or further testing in the hospital.  Your exam/testing today is overall reassuring.  Your symptoms seem to be due to a kidney stone.  We discussed that the kidney stone is quite large and if you also have an infection in your urine this could become an emergency.  We ask that you call the urology office first thing in the morning to schedule an appointment.  We have messaged the on-call doctor who should be expecting your call.  Stop taking your trimethoprim antibiotic and start taking the new Keflex antibiotic.  Use Tylenol at home for discomfort.  Use over-the-counter MiraLAX for constipation.  Please return to the emergency department with any return of pain or fever.  Please return to the Emergency Department if you experience any worsening of your condition. Thank you for allowing [place] to be a part of your care.       Sabas Sous, MD 04/29/20 [phone]

## 2020-05-05 ENCOUNTER — Other Ambulatory Visit: Payer: Self-pay | Admitting: Urology

## 2020-05-09 ENCOUNTER — Other Ambulatory Visit (HOSPITAL_COMMUNITY)
Admission: RE | Admit: 2020-05-09 | Discharge: 2020-05-09 | Disposition: A | Discharge status: Home / Self Care | Payer: Medicare Other | Source: Ambulatory Visit | Attending: Urology | Admitting: Urology

## 2020-05-09 DIAGNOSIS — Z20822 Contact with and (suspected) exposure to covid-19: Secondary | ICD-10-CM | POA: Insufficient documentation

## 2020-05-09 DIAGNOSIS — Z01812 Encounter for preprocedural laboratory examination: Secondary | ICD-10-CM | POA: Diagnosis present

## 2020-05-09 LAB — SARS CORONAVIRUS 2 (TAT 6-24 HRS): SARS Coronavirus 2: NEGATIVE

## 2020-05-09 NOTE — Progress Notes (Signed)
Pre op phone call completed. Pt does not speak [place]. Phone call completed with granddaughter who will be translating for the patient on Thursday.   Pt does take Aspirin but is stopping it.  Aware of clear liquids until 0700 and then NPO.

## 2020-05-10 NOTE — Progress Notes (Signed)
Unable to acquire an in-person interpreter. Gujarati is available on the ipad language assist.

## 2020-05-11 NOTE — H&P (Signed)
C: Right renal calculus  HPI:  05/05/2020  85 year old male presented to the emergency department on 04/29/2020 with abdominal pain. CT scan was performed that revealed a 1.1 cm right ureteropelvic junction calculus. He continues to have intermittent right-sided flank pain. He has had several stones in the past. He has had lithotripsy and ureteroscopy. He takes aspirin 81 for general health. No history of cardiac disease to his knowledge. Stone is visible on [facility].     ALLERGIES: No Allergies    MEDICATIONS: AmLODIPine Besylate 5 MG Oral Tablet Oral  Aspirin 81 MG TABS Oral  Hydrocodone-Acetaminophen  Levothyroxine 25 mcg capsule  Simvastatin 20 MG Oral Tablet Oral     GU PSH: Cysto Uretero Lithotripsy - 2012 Cystoscopy Insert Stent - 2012, 2012 ESWL - 2013, 2012, 2012       PSH Notes: Lithotripsy, Cystoscopy With Ureteroscopy With Lithotripsy, Cystoscopy With Insertion Of Ureteral Stent Right, Lithotripsy, Lithotripsy, Cystoscopy With Insertion Of Ureteral Stent Right, Cataract Surgery  Craniotomy     NON-GU PSH: None   GU PMH: Acute Cystitis/UTI, Acute Cystitis - 2014 Flank Pain, Generalized abdominal pain - 2014 Gross hematuria, Gross hematuria - 2014 Obstructive and reflux uropathy, Unspec, Obstructive uropathy - 2014 Other microscopic hematuria, Microscopic Hematuria - 2014 Renal calculus, Nephrolithiasis - 2014 Ureteral calculus, Calculus of ureter - 2014 Ureteral obstruction, Hydronephrosis with ureteral stricture, not elsewhere classified - 2014 Urinary Frequency, Increased urinary frequency - 2014 Urinary Tract Inf, Unspec site, Pyuria - 2014 Urinary Urgency, Urinary urgency - 2014 Chronic Kidney Disease      PMH Notes:  1898-11-12 00:00:00 - Note: Normal Routine History And Physical Senior Citizen [phone])  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   Tobacco Use Assessment Completed: Used Tobacco in last 30 days? Drinks 1 caffeinated drink per day.     Notes: Never A Smoker, Caffeine Use, Alcohol Use, Occupation:, Marital History - Currently Married   REVIEW OF SYSTEMS:    GU Review Male:   Patient reports frequent urination and get up at night to urinate. Patient denies hard to postpone urination, burning/ pain with urination, leakage of urine, stream starts and stops, trouble starting your stream, have to strain to urinate , erection problems, and penile pain.  Gastrointestinal (Upper):   Patient denies nausea, vomiting, and indigestion/ heartburn.  Gastrointestinal (Lower):   Patient reports constipation. Patient denies diarrhea.  Constitutional:   Patient denies fever, night sweats, weight loss, and fatigue.  Skin:   Patient denies itching and skin rash/ lesion.  Eyes:   Patient denies blurred vision and double vision.  Ears/ Nose/ Throat:   Patient denies sore throat and sinus problems.  Hematologic/Lymphatic:   Patient denies swollen glands and easy bruising.  Cardiovascular:   Patient reports chest pains. Patient denies leg swelling.  Respiratory:   Patient denies cough and shortness of breath.  Endocrine:   Patient denies excessive thirst.  Musculoskeletal:   Patient denies back pain and joint pain.  Neurological:   Patient denies headaches  and dizziness.  Psychologic:   Patient denies depression and anxiety.   VITAL SIGNS: None   MULTI-SYSTEM PHYSICAL EXAMINATION:    Constitutional: Well-nourished. No  physical deformities. Normally developed. Good grooming.  Respiratory: No labored breathing, no use of accessory muscles.   Cardiovascular: Normal temperature, normal extremity pulses, no swelling, no varicosities.  Skin: No paleness, no jaundice, no cyanosis. No lesion, no ulcer, no rash.  Neurologic / Psychiatric: Oriented to time, oriented to place, oriented to person. No depression, no anxiety, no agitation.  Gastrointestinal: No mass, no tenderness, no rigidity, non obese abdomen.  Eyes: Normal conjunctivae. Normal eyelids.  Musculoskeletal: Normal gait and station of head and neck.     Complexity of Data:  Records Review:   Previous Doctor Records, Previous Patient Records  Urine Test Review:   Urinalysis  X-Ray Review: C.T. Abdomen/Pelvis: Reviewed Films. Reviewed Report. Discussed With Patient.     08/17/11  PSA  Total PSA 0.25     PROCEDURES:          Urinalysis Dipstick Dipstick Cont'd  Color: Yellow Bilirubin: Neg mg/dL  Appearance: Clear Ketones: Neg mg/dL  Specific Gravity: 3.710 Blood: Neg ery/uL  pH: <=5.0 Protein: Neg mg/dL  Glucose: Neg mg/dL Urobilinogen: 0.2 mg/dL    Nitrites: Neg    Leukocyte Esterase: Neg leu/uL    ASSESSMENT:      ICD-10 Details  1 GU:   Renal calculus - N20.0 Undiagnosed New Problem  2   Flank Pain - R10.84 Undiagnosed New Problem   PLAN:            Medications New Meds: Hydrocodone-Acetaminophen 5 mg-325 mg tablet 1 tablet PO Q 6 H PRN pain  #10  0 Refill(s)            Orders Labs Urine Culture          Document Letter(s):  Created for Patient: Clinical Summary         Notes:   We discussed the management of urinary stones. These options include observation, ureteroscopy, and shockwave lithotripsy. We discussed which options are relevant to these particular stones. We discussed the natural history of stones as well as the complications of untreated stones and the impact on quality of life without treatment as well as with  each of the above listed treatments. We also discussed the efficacy of each treatment in its ability to clear the stone burden. With any of these management options I discussed the signs and symptoms of infection and the need for emergent treatment should these be experienced. For each option we discussed the ability of each procedure to clear the patient of their stone burden.   For observation I described the risks which include but are not limited to silent renal damage, life-threatening infection, need for emergent surgery, failure to pass stone, and pain.   For ureteroscopy I described the risks which include heart attack, stroke, pulmonary embolus, death, bleeding, infection, damage to contiguous structures, positioning injury, ureteral stricture, ureteral avulsion, ureteral injury, need for ureteral stent, inability to perform ureteroscopy, need for an interval procedure, inability to clear stone burden, stent discomfort and pain.   For shockwave lithotripsy I described the risks which include arrhythmia, kidney contusion, kidney hemorrhage, need for transfusion, pain, inability to break up stone, inability to pass stone fragments, Steinstrasse, infection associated with obstructing stones, need for different surgical procedure, need for repeat shockwave lithotripsy.   He would like to proceed with ESWL    After a thorough review  of the management options for the patient's condition the patient  elected to proceed with surgical therapy as noted above. We have discussed the potential benefits and risks of the procedure, side effects of the proposed treatment, the likelihood of the patient achieving the goals of the procedure, and any potential problems that might occur during the procedure or recuperation. Informed consent has been obtained.

## 2020-05-12 ENCOUNTER — Encounter (HOSPITAL_BASED_OUTPATIENT_CLINIC_OR_DEPARTMENT_OTHER)
Admission: RE | Disposition: A | Discharge status: Home / Self Care | Payer: Self-pay | Source: Home / Self Care | Attending: Urology

## 2020-05-12 ENCOUNTER — Encounter (HOSPITAL_BASED_OUTPATIENT_CLINIC_OR_DEPARTMENT_OTHER): Payer: Self-pay | Admitting: Urology

## 2020-05-12 ENCOUNTER — Ambulatory Visit (HOSPITAL_COMMUNITY): Payer: Medicare Other

## 2020-05-12 ENCOUNTER — Ambulatory Visit (HOSPITAL_BASED_OUTPATIENT_CLINIC_OR_DEPARTMENT_OTHER)
Admission: RE | Admit: 2020-05-12 | Discharge: 2020-05-12 | Disposition: A | Discharge status: Home / Self Care | Payer: Medicare Other | Attending: Urology | Admitting: Urology

## 2020-05-12 ENCOUNTER — Other Ambulatory Visit: Payer: Self-pay

## 2020-05-12 DIAGNOSIS — E7800 Pure hypercholesterolemia, unspecified: Secondary | ICD-10-CM | POA: Diagnosis not present

## 2020-05-12 DIAGNOSIS — Z79899 Other long term (current) drug therapy: Secondary | ICD-10-CM | POA: Insufficient documentation

## 2020-05-12 DIAGNOSIS — Z79890 Hormone replacement therapy (postmenopausal): Secondary | ICD-10-CM | POA: Insufficient documentation

## 2020-05-12 DIAGNOSIS — Z7982 Long term (current) use of aspirin: Secondary | ICD-10-CM | POA: Diagnosis not present

## 2020-05-12 DIAGNOSIS — N201 Calculus of ureter: Secondary | ICD-10-CM | POA: Diagnosis present

## 2020-05-12 DIAGNOSIS — N200 Calculus of kidney: Secondary | ICD-10-CM

## 2020-05-12 HISTORY — PX: EXTRACORPOREAL SHOCK WAVE LITHOTRIPSY: SHX1557

## 2020-05-12 SURGERY — LITHOTRIPSY, ESWL
Anesthesia: LOCAL | Laterality: Right

## 2020-05-12 MED ORDER — CIPROFLOXACIN HCL 500 MG PO TABS
ORAL_TABLET | ORAL | Status: AC
  Filled 2020-05-12: qty 1

## 2020-05-12 MED ORDER — DIPHENHYDRAMINE HCL 25 MG PO CAPS
ORAL_CAPSULE | ORAL | Status: AC
  Filled 2020-05-12: qty 1

## 2020-05-12 MED ORDER — DIPHENHYDRAMINE HCL 25 MG PO CAPS
25.0000 mg | ORAL_CAPSULE | ORAL | Status: AC
  Administered 2020-05-12: 25 mg via ORAL

## 2020-05-12 MED ORDER — DIAZEPAM 5 MG PO TABS
10.0000 mg | ORAL_TABLET | ORAL | Status: AC
  Administered 2020-05-12: 10 mg via ORAL

## 2020-05-12 MED ORDER — SODIUM CHLORIDE 0.9 % IV SOLN: INTRAVENOUS | Status: DC

## 2020-05-12 MED ORDER — DIAZEPAM 5 MG PO TABS
ORAL_TABLET | ORAL | Status: AC
  Filled 2020-05-12: qty 2

## 2020-05-12 MED ORDER — CIPROFLOXACIN HCL 500 MG PO TABS
500.0000 mg | ORAL_TABLET | ORAL | Status: AC
  Administered 2020-05-12: 500 mg via ORAL

## 2020-05-12 NOTE — Discharge Instructions (Signed)
I have reviewed discharge instructions in detail with the patient. They will follow-up with me or their physician as scheduled. My nurse will also be calling the patients as per protocol.   ? ? ?Post Anesthesia Home Care Instructions ? ?Activity: ?Get plenty of rest for the remainder of the day. A responsible individual must stay with you for 24 hours following the procedure.  ?For the next 24 hours, DO NOT: ?-Drive a car ?-Operate machinery ?-Drink alcoholic beverages ?-Take any medication unless instructed by your physician ?-Make any legal decisions or sign important papers. ? ?Meals: ?Start with liquid foods such as gelatin or soup. Progress to regular foods as tolerated. Avoid greasy, spicy, heavy foods. If nausea and/or vomiting occur, drink only clear liquids until the nausea and/or vomiting subsides. Call your physician if vomiting continues. ? ?

## 2020-05-12 NOTE — Interval H&P Note (Signed)
History and Physical Interval Note:  05/12/2020 1:51 PM  [name]  has presented today for surgery, with the diagnosis of RIGHT RENAL STONE.  The various methods of treatment have been discussed with the patient and family. After consideration of risks, benefits and other options for treatment, the patient has consented to  Procedure(s): EXTRACORPOREAL SHOCK WAVE LITHOTRIPSY (ESWL) (Right) as a surgical intervention.  The patient's history has been reviewed, patient examined, no change in status, stable for surgery.  I have reviewed the patient's chart and labs.  Questions were answered to the patient's satisfaction.     Ylianna Almanzar A Kimberly Nieland

## 2020-05-12 NOTE — Progress Notes (Signed)
Attempted to use the [employer] video interpreter to assist with patient care. The availability for the patients spoken language was audio only. The patient was having difficulty hearing the interpreter without being able to see their face. The grandson has accompanied the patient today to use a his interpreter. The grandson is 85 years of age, but we are having no success with the [employer]. Waiver signed.

## 2020-05-13 ENCOUNTER — Encounter (HOSPITAL_BASED_OUTPATIENT_CLINIC_OR_DEPARTMENT_OTHER): Payer: Self-pay | Admitting: Urology

## 2020-08-10 ENCOUNTER — Other Ambulatory Visit: Payer: Self-pay | Admitting: Family Medicine

## 2020-08-10 DIAGNOSIS — R209 Unspecified disturbances of skin sensation: Secondary | ICD-10-CM

## 2020-08-12 ENCOUNTER — Other Ambulatory Visit: Payer: Self-pay | Admitting: Family Medicine

## 2020-08-12 DIAGNOSIS — R1310 Dysphagia, unspecified: Secondary | ICD-10-CM

## 2020-08-12 DIAGNOSIS — R140 Abdominal distension (gaseous): Secondary | ICD-10-CM

## 2020-08-12 DIAGNOSIS — R142 Eructation: Secondary | ICD-10-CM

## 2020-08-25 ENCOUNTER — Other Ambulatory Visit: Payer: Self-pay | Admitting: Family Medicine

## 2020-08-25 DIAGNOSIS — R109 Unspecified abdominal pain: Secondary | ICD-10-CM

## 2020-08-26 ENCOUNTER — Ambulatory Visit
Admission: RE | Admit: 2020-08-26 | Discharge: 2020-08-26 | Disposition: A | Discharge status: Home / Self Care | Payer: Medicare Other | Source: Ambulatory Visit | Attending: Family Medicine | Admitting: Family Medicine

## 2020-08-26 ENCOUNTER — Other Ambulatory Visit: Payer: Self-pay

## 2020-08-26 DIAGNOSIS — R109 Unspecified abdominal pain: Secondary | ICD-10-CM

## 2020-09-12 ENCOUNTER — Other Ambulatory Visit: Payer: Self-pay | Admitting: Family Medicine

## 2020-09-12 ENCOUNTER — Ambulatory Visit
Admission: RE | Admit: 2020-09-12 | Discharge: 2020-09-12 | Disposition: A | Discharge status: Home / Self Care | Payer: Medicare Other | Source: Ambulatory Visit | Attending: Family Medicine | Admitting: Family Medicine

## 2020-09-12 DIAGNOSIS — R142 Eructation: Secondary | ICD-10-CM

## 2020-09-12 DIAGNOSIS — R140 Abdominal distension (gaseous): Secondary | ICD-10-CM

## 2020-09-12 DIAGNOSIS — R1310 Dysphagia, unspecified: Secondary | ICD-10-CM

## 2020-09-27 ENCOUNTER — Other Ambulatory Visit: Payer: Medicare Other

## 2020-10-10 ENCOUNTER — Ambulatory Visit
Admission: RE | Admit: 2020-10-10 | Discharge: 2020-10-10 | Disposition: A | Discharge status: Home / Self Care | Payer: Medicare Other | Source: Ambulatory Visit | Attending: Family Medicine | Admitting: Family Medicine

## 2020-10-10 DIAGNOSIS — R209 Unspecified disturbances of skin sensation: Secondary | ICD-10-CM

## 2021-05-02 DIAGNOSIS — E785 Hyperlipidemia, unspecified: Secondary | ICD-10-CM | POA: Diagnosis not present

## 2021-05-02 DIAGNOSIS — I700 Atherosclerosis of aorta: Secondary | ICD-10-CM | POA: Diagnosis not present

## 2021-05-02 DIAGNOSIS — E039 Hypothyroidism, unspecified: Secondary | ICD-10-CM | POA: Diagnosis not present

## 2021-05-02 DIAGNOSIS — E1169 Type 2 diabetes mellitus with other specified complication: Secondary | ICD-10-CM | POA: Diagnosis not present

## 2021-05-02 DIAGNOSIS — I10 Essential (primary) hypertension: Secondary | ICD-10-CM | POA: Diagnosis not present

## 2021-11-01 DIAGNOSIS — E440 Moderate protein-calorie malnutrition: Secondary | ICD-10-CM | POA: Diagnosis not present

## 2021-11-01 DIAGNOSIS — Z1389 Encounter for screening for other disorder: Secondary | ICD-10-CM | POA: Diagnosis not present

## 2021-11-01 DIAGNOSIS — Z23 Encounter for immunization: Secondary | ICD-10-CM | POA: Diagnosis not present

## 2021-11-01 DIAGNOSIS — E039 Hypothyroidism, unspecified: Secondary | ICD-10-CM | POA: Diagnosis not present

## 2021-11-01 DIAGNOSIS — I69322 Dysarthria following cerebral infarction: Secondary | ICD-10-CM | POA: Diagnosis not present

## 2021-11-01 DIAGNOSIS — E1169 Type 2 diabetes mellitus with other specified complication: Secondary | ICD-10-CM | POA: Diagnosis not present

## 2021-11-01 DIAGNOSIS — E785 Hyperlipidemia, unspecified: Secondary | ICD-10-CM | POA: Diagnosis not present

## 2021-11-01 DIAGNOSIS — R2681 Unsteadiness on feet: Secondary | ICD-10-CM | POA: Diagnosis not present

## 2021-11-01 DIAGNOSIS — I10 Essential (primary) hypertension: Secondary | ICD-10-CM | POA: Diagnosis not present

## 2021-11-01 DIAGNOSIS — Z0000 Encounter for general adult medical examination without abnormal findings: Secondary | ICD-10-CM | POA: Diagnosis not present

## 2021-11-01 DIAGNOSIS — H903 Sensorineural hearing loss, bilateral: Secondary | ICD-10-CM | POA: Diagnosis not present

## 2021-11-14 DIAGNOSIS — H903 Sensorineural hearing loss, bilateral: Secondary | ICD-10-CM | POA: Diagnosis not present

## 2021-11-14 DIAGNOSIS — I69322 Dysarthria following cerebral infarction: Secondary | ICD-10-CM | POA: Diagnosis not present

## 2021-11-14 DIAGNOSIS — Z87442 Personal history of urinary calculi: Secondary | ICD-10-CM | POA: Diagnosis not present

## 2021-11-14 DIAGNOSIS — I10 Essential (primary) hypertension: Secondary | ICD-10-CM | POA: Diagnosis not present

## 2021-11-14 DIAGNOSIS — I69398 Other sequelae of cerebral infarction: Secondary | ICD-10-CM | POA: Diagnosis not present

## 2021-11-14 DIAGNOSIS — Z9181 History of falling: Secondary | ICD-10-CM | POA: Diagnosis not present

## 2021-11-14 DIAGNOSIS — E039 Hypothyroidism, unspecified: Secondary | ICD-10-CM | POA: Diagnosis not present

## 2021-11-14 DIAGNOSIS — E785 Hyperlipidemia, unspecified: Secondary | ICD-10-CM | POA: Diagnosis not present

## 2021-11-14 DIAGNOSIS — E119 Type 2 diabetes mellitus without complications: Secondary | ICD-10-CM | POA: Diagnosis not present

## 2021-11-14 DIAGNOSIS — I700 Atherosclerosis of aorta: Secondary | ICD-10-CM | POA: Diagnosis not present

## 2021-11-14 DIAGNOSIS — I152 Hypertension secondary to endocrine disorders: Secondary | ICD-10-CM | POA: Diagnosis not present

## 2021-11-14 DIAGNOSIS — Z7982 Long term (current) use of aspirin: Secondary | ICD-10-CM | POA: Diagnosis not present

## 2021-11-14 DIAGNOSIS — E440 Moderate protein-calorie malnutrition: Secondary | ICD-10-CM | POA: Diagnosis not present

## 2021-11-14 DIAGNOSIS — R531 Weakness: Secondary | ICD-10-CM | POA: Diagnosis not present

## 2021-11-14 DIAGNOSIS — E1169 Type 2 diabetes mellitus with other specified complication: Secondary | ICD-10-CM | POA: Diagnosis not present

## 2021-11-17 DIAGNOSIS — I69398 Other sequelae of cerebral infarction: Secondary | ICD-10-CM | POA: Diagnosis not present

## 2021-11-17 DIAGNOSIS — H903 Sensorineural hearing loss, bilateral: Secondary | ICD-10-CM | POA: Diagnosis not present

## 2021-11-17 DIAGNOSIS — I152 Hypertension secondary to endocrine disorders: Secondary | ICD-10-CM | POA: Diagnosis not present

## 2021-11-17 DIAGNOSIS — I10 Essential (primary) hypertension: Secondary | ICD-10-CM | POA: Diagnosis not present

## 2021-11-17 DIAGNOSIS — E440 Moderate protein-calorie malnutrition: Secondary | ICD-10-CM | POA: Diagnosis not present

## 2021-11-17 DIAGNOSIS — Z87442 Personal history of urinary calculi: Secondary | ICD-10-CM | POA: Diagnosis not present

## 2021-11-17 DIAGNOSIS — I700 Atherosclerosis of aorta: Secondary | ICD-10-CM | POA: Diagnosis not present

## 2021-11-17 DIAGNOSIS — E785 Hyperlipidemia, unspecified: Secondary | ICD-10-CM | POA: Diagnosis not present

## 2021-11-17 DIAGNOSIS — R531 Weakness: Secondary | ICD-10-CM | POA: Diagnosis not present

## 2021-11-17 DIAGNOSIS — Z9181 History of falling: Secondary | ICD-10-CM | POA: Diagnosis not present

## 2021-11-17 DIAGNOSIS — E119 Type 2 diabetes mellitus without complications: Secondary | ICD-10-CM | POA: Diagnosis not present

## 2021-11-17 DIAGNOSIS — I69322 Dysarthria following cerebral infarction: Secondary | ICD-10-CM | POA: Diagnosis not present

## 2021-11-17 DIAGNOSIS — Z7982 Long term (current) use of aspirin: Secondary | ICD-10-CM | POA: Diagnosis not present

## 2021-11-17 DIAGNOSIS — E1169 Type 2 diabetes mellitus with other specified complication: Secondary | ICD-10-CM | POA: Diagnosis not present

## 2021-11-17 DIAGNOSIS — E039 Hypothyroidism, unspecified: Secondary | ICD-10-CM | POA: Diagnosis not present

## 2021-11-18 DIAGNOSIS — I69398 Other sequelae of cerebral infarction: Secondary | ICD-10-CM | POA: Diagnosis not present

## 2021-11-18 DIAGNOSIS — I10 Essential (primary) hypertension: Secondary | ICD-10-CM | POA: Diagnosis not present

## 2021-11-18 DIAGNOSIS — R531 Weakness: Secondary | ICD-10-CM | POA: Diagnosis not present

## 2021-11-18 DIAGNOSIS — E1169 Type 2 diabetes mellitus with other specified complication: Secondary | ICD-10-CM | POA: Diagnosis not present

## 2021-11-18 DIAGNOSIS — I152 Hypertension secondary to endocrine disorders: Secondary | ICD-10-CM | POA: Diagnosis not present

## 2021-11-18 DIAGNOSIS — E440 Moderate protein-calorie malnutrition: Secondary | ICD-10-CM | POA: Diagnosis not present

## 2021-11-18 DIAGNOSIS — E119 Type 2 diabetes mellitus without complications: Secondary | ICD-10-CM | POA: Diagnosis not present

## 2021-11-18 DIAGNOSIS — Z87442 Personal history of urinary calculi: Secondary | ICD-10-CM | POA: Diagnosis not present

## 2021-11-18 DIAGNOSIS — I700 Atherosclerosis of aorta: Secondary | ICD-10-CM | POA: Diagnosis not present

## 2021-11-18 DIAGNOSIS — Z7982 Long term (current) use of aspirin: Secondary | ICD-10-CM | POA: Diagnosis not present

## 2021-11-18 DIAGNOSIS — E039 Hypothyroidism, unspecified: Secondary | ICD-10-CM | POA: Diagnosis not present

## 2021-11-18 DIAGNOSIS — Z9181 History of falling: Secondary | ICD-10-CM | POA: Diagnosis not present

## 2021-11-18 DIAGNOSIS — E785 Hyperlipidemia, unspecified: Secondary | ICD-10-CM | POA: Diagnosis not present

## 2021-11-18 DIAGNOSIS — I69322 Dysarthria following cerebral infarction: Secondary | ICD-10-CM | POA: Diagnosis not present

## 2021-11-18 DIAGNOSIS — H903 Sensorineural hearing loss, bilateral: Secondary | ICD-10-CM | POA: Diagnosis not present

## 2021-11-20 DIAGNOSIS — Z7982 Long term (current) use of aspirin: Secondary | ICD-10-CM | POA: Diagnosis not present

## 2021-11-20 DIAGNOSIS — H903 Sensorineural hearing loss, bilateral: Secondary | ICD-10-CM | POA: Diagnosis not present

## 2021-11-20 DIAGNOSIS — I10 Essential (primary) hypertension: Secondary | ICD-10-CM | POA: Diagnosis not present

## 2021-11-20 DIAGNOSIS — R531 Weakness: Secondary | ICD-10-CM | POA: Diagnosis not present

## 2021-11-20 DIAGNOSIS — E039 Hypothyroidism, unspecified: Secondary | ICD-10-CM | POA: Diagnosis not present

## 2021-11-20 DIAGNOSIS — Z9181 History of falling: Secondary | ICD-10-CM | POA: Diagnosis not present

## 2021-11-20 DIAGNOSIS — I69322 Dysarthria following cerebral infarction: Secondary | ICD-10-CM | POA: Diagnosis not present

## 2021-11-20 DIAGNOSIS — Z87442 Personal history of urinary calculi: Secondary | ICD-10-CM | POA: Diagnosis not present

## 2021-11-20 DIAGNOSIS — E119 Type 2 diabetes mellitus without complications: Secondary | ICD-10-CM | POA: Diagnosis not present

## 2021-11-20 DIAGNOSIS — E785 Hyperlipidemia, unspecified: Secondary | ICD-10-CM | POA: Diagnosis not present

## 2021-11-20 DIAGNOSIS — E1169 Type 2 diabetes mellitus with other specified complication: Secondary | ICD-10-CM | POA: Diagnosis not present

## 2021-11-20 DIAGNOSIS — I700 Atherosclerosis of aorta: Secondary | ICD-10-CM | POA: Diagnosis not present

## 2021-11-20 DIAGNOSIS — E440 Moderate protein-calorie malnutrition: Secondary | ICD-10-CM | POA: Diagnosis not present

## 2021-11-20 DIAGNOSIS — I69398 Other sequelae of cerebral infarction: Secondary | ICD-10-CM | POA: Diagnosis not present

## 2021-11-20 DIAGNOSIS — I152 Hypertension secondary to endocrine disorders: Secondary | ICD-10-CM | POA: Diagnosis not present

## 2021-11-21 DIAGNOSIS — Z9181 History of falling: Secondary | ICD-10-CM | POA: Diagnosis not present

## 2021-11-21 DIAGNOSIS — I69322 Dysarthria following cerebral infarction: Secondary | ICD-10-CM | POA: Diagnosis not present

## 2021-11-21 DIAGNOSIS — H903 Sensorineural hearing loss, bilateral: Secondary | ICD-10-CM | POA: Diagnosis not present

## 2021-11-21 DIAGNOSIS — I69398 Other sequelae of cerebral infarction: Secondary | ICD-10-CM | POA: Diagnosis not present

## 2021-11-21 DIAGNOSIS — Z87442 Personal history of urinary calculi: Secondary | ICD-10-CM | POA: Diagnosis not present

## 2021-11-21 DIAGNOSIS — R531 Weakness: Secondary | ICD-10-CM | POA: Diagnosis not present

## 2021-11-21 DIAGNOSIS — E119 Type 2 diabetes mellitus without complications: Secondary | ICD-10-CM | POA: Diagnosis not present

## 2021-11-21 DIAGNOSIS — E785 Hyperlipidemia, unspecified: Secondary | ICD-10-CM | POA: Diagnosis not present

## 2021-11-21 DIAGNOSIS — I700 Atherosclerosis of aorta: Secondary | ICD-10-CM | POA: Diagnosis not present

## 2021-11-21 DIAGNOSIS — E1169 Type 2 diabetes mellitus with other specified complication: Secondary | ICD-10-CM | POA: Diagnosis not present

## 2021-11-21 DIAGNOSIS — E440 Moderate protein-calorie malnutrition: Secondary | ICD-10-CM | POA: Diagnosis not present

## 2021-11-21 DIAGNOSIS — I152 Hypertension secondary to endocrine disorders: Secondary | ICD-10-CM | POA: Diagnosis not present

## 2021-11-21 DIAGNOSIS — I10 Essential (primary) hypertension: Secondary | ICD-10-CM | POA: Diagnosis not present

## 2021-11-21 DIAGNOSIS — Z7982 Long term (current) use of aspirin: Secondary | ICD-10-CM | POA: Diagnosis not present

## 2021-11-21 DIAGNOSIS — E039 Hypothyroidism, unspecified: Secondary | ICD-10-CM | POA: Diagnosis not present

## 2021-11-24 DIAGNOSIS — I10 Essential (primary) hypertension: Secondary | ICD-10-CM | POA: Diagnosis not present

## 2021-11-24 DIAGNOSIS — Z9181 History of falling: Secondary | ICD-10-CM | POA: Diagnosis not present

## 2021-11-24 DIAGNOSIS — I69322 Dysarthria following cerebral infarction: Secondary | ICD-10-CM | POA: Diagnosis not present

## 2021-11-24 DIAGNOSIS — Z7982 Long term (current) use of aspirin: Secondary | ICD-10-CM | POA: Diagnosis not present

## 2021-11-24 DIAGNOSIS — I152 Hypertension secondary to endocrine disorders: Secondary | ICD-10-CM | POA: Diagnosis not present

## 2021-11-24 DIAGNOSIS — E039 Hypothyroidism, unspecified: Secondary | ICD-10-CM | POA: Diagnosis not present

## 2021-11-24 DIAGNOSIS — E440 Moderate protein-calorie malnutrition: Secondary | ICD-10-CM | POA: Diagnosis not present

## 2021-11-24 DIAGNOSIS — E1169 Type 2 diabetes mellitus with other specified complication: Secondary | ICD-10-CM | POA: Diagnosis not present

## 2021-11-24 DIAGNOSIS — I700 Atherosclerosis of aorta: Secondary | ICD-10-CM | POA: Diagnosis not present

## 2021-11-24 DIAGNOSIS — R531 Weakness: Secondary | ICD-10-CM | POA: Diagnosis not present

## 2021-11-24 DIAGNOSIS — E119 Type 2 diabetes mellitus without complications: Secondary | ICD-10-CM | POA: Diagnosis not present

## 2021-11-24 DIAGNOSIS — I69398 Other sequelae of cerebral infarction: Secondary | ICD-10-CM | POA: Diagnosis not present

## 2021-11-24 DIAGNOSIS — E785 Hyperlipidemia, unspecified: Secondary | ICD-10-CM | POA: Diagnosis not present

## 2021-11-24 DIAGNOSIS — H903 Sensorineural hearing loss, bilateral: Secondary | ICD-10-CM | POA: Diagnosis not present

## 2021-11-24 DIAGNOSIS — Z87442 Personal history of urinary calculi: Secondary | ICD-10-CM | POA: Diagnosis not present

## 2021-11-27 DIAGNOSIS — Z7982 Long term (current) use of aspirin: Secondary | ICD-10-CM | POA: Diagnosis not present

## 2021-11-27 DIAGNOSIS — E119 Type 2 diabetes mellitus without complications: Secondary | ICD-10-CM | POA: Diagnosis not present

## 2021-11-27 DIAGNOSIS — I700 Atherosclerosis of aorta: Secondary | ICD-10-CM | POA: Diagnosis not present

## 2021-11-27 DIAGNOSIS — E1169 Type 2 diabetes mellitus with other specified complication: Secondary | ICD-10-CM | POA: Diagnosis not present

## 2021-11-27 DIAGNOSIS — Z9181 History of falling: Secondary | ICD-10-CM | POA: Diagnosis not present

## 2021-11-27 DIAGNOSIS — E785 Hyperlipidemia, unspecified: Secondary | ICD-10-CM | POA: Diagnosis not present

## 2021-11-27 DIAGNOSIS — I69398 Other sequelae of cerebral infarction: Secondary | ICD-10-CM | POA: Diagnosis not present

## 2021-11-27 DIAGNOSIS — E440 Moderate protein-calorie malnutrition: Secondary | ICD-10-CM | POA: Diagnosis not present

## 2021-11-27 DIAGNOSIS — I69322 Dysarthria following cerebral infarction: Secondary | ICD-10-CM | POA: Diagnosis not present

## 2021-11-27 DIAGNOSIS — H903 Sensorineural hearing loss, bilateral: Secondary | ICD-10-CM | POA: Diagnosis not present

## 2021-11-27 DIAGNOSIS — R531 Weakness: Secondary | ICD-10-CM | POA: Diagnosis not present

## 2021-11-27 DIAGNOSIS — I152 Hypertension secondary to endocrine disorders: Secondary | ICD-10-CM | POA: Diagnosis not present

## 2021-11-27 DIAGNOSIS — Z87442 Personal history of urinary calculi: Secondary | ICD-10-CM | POA: Diagnosis not present

## 2021-11-27 DIAGNOSIS — E039 Hypothyroidism, unspecified: Secondary | ICD-10-CM | POA: Diagnosis not present

## 2021-11-27 DIAGNOSIS — I10 Essential (primary) hypertension: Secondary | ICD-10-CM | POA: Diagnosis not present

## 2021-11-29 DIAGNOSIS — Z87442 Personal history of urinary calculi: Secondary | ICD-10-CM | POA: Diagnosis not present

## 2021-11-29 DIAGNOSIS — E785 Hyperlipidemia, unspecified: Secondary | ICD-10-CM | POA: Diagnosis not present

## 2021-11-29 DIAGNOSIS — Z7982 Long term (current) use of aspirin: Secondary | ICD-10-CM | POA: Diagnosis not present

## 2021-11-29 DIAGNOSIS — I69398 Other sequelae of cerebral infarction: Secondary | ICD-10-CM | POA: Diagnosis not present

## 2021-11-29 DIAGNOSIS — E440 Moderate protein-calorie malnutrition: Secondary | ICD-10-CM | POA: Diagnosis not present

## 2021-11-29 DIAGNOSIS — I152 Hypertension secondary to endocrine disorders: Secondary | ICD-10-CM | POA: Diagnosis not present

## 2021-11-29 DIAGNOSIS — I10 Essential (primary) hypertension: Secondary | ICD-10-CM | POA: Diagnosis not present

## 2021-11-29 DIAGNOSIS — E1169 Type 2 diabetes mellitus with other specified complication: Secondary | ICD-10-CM | POA: Diagnosis not present

## 2021-11-29 DIAGNOSIS — H903 Sensorineural hearing loss, bilateral: Secondary | ICD-10-CM | POA: Diagnosis not present

## 2021-11-29 DIAGNOSIS — I69322 Dysarthria following cerebral infarction: Secondary | ICD-10-CM | POA: Diagnosis not present

## 2021-11-29 DIAGNOSIS — E119 Type 2 diabetes mellitus without complications: Secondary | ICD-10-CM | POA: Diagnosis not present

## 2021-11-29 DIAGNOSIS — Z9181 History of falling: Secondary | ICD-10-CM | POA: Diagnosis not present

## 2021-11-29 DIAGNOSIS — E039 Hypothyroidism, unspecified: Secondary | ICD-10-CM | POA: Diagnosis not present

## 2021-11-29 DIAGNOSIS — I700 Atherosclerosis of aorta: Secondary | ICD-10-CM | POA: Diagnosis not present

## 2021-11-29 DIAGNOSIS — R531 Weakness: Secondary | ICD-10-CM | POA: Diagnosis not present

## 2021-12-05 DIAGNOSIS — E039 Hypothyroidism, unspecified: Secondary | ICD-10-CM | POA: Diagnosis not present

## 2021-12-05 DIAGNOSIS — I152 Hypertension secondary to endocrine disorders: Secondary | ICD-10-CM | POA: Diagnosis not present

## 2021-12-05 DIAGNOSIS — I700 Atherosclerosis of aorta: Secondary | ICD-10-CM | POA: Diagnosis not present

## 2021-12-05 DIAGNOSIS — Z9181 History of falling: Secondary | ICD-10-CM | POA: Diagnosis not present

## 2021-12-05 DIAGNOSIS — E1169 Type 2 diabetes mellitus with other specified complication: Secondary | ICD-10-CM | POA: Diagnosis not present

## 2021-12-05 DIAGNOSIS — E119 Type 2 diabetes mellitus without complications: Secondary | ICD-10-CM | POA: Diagnosis not present

## 2021-12-05 DIAGNOSIS — E785 Hyperlipidemia, unspecified: Secondary | ICD-10-CM | POA: Diagnosis not present

## 2021-12-05 DIAGNOSIS — I10 Essential (primary) hypertension: Secondary | ICD-10-CM | POA: Diagnosis not present

## 2021-12-05 DIAGNOSIS — H903 Sensorineural hearing loss, bilateral: Secondary | ICD-10-CM | POA: Diagnosis not present

## 2021-12-05 DIAGNOSIS — I69322 Dysarthria following cerebral infarction: Secondary | ICD-10-CM | POA: Diagnosis not present

## 2021-12-05 DIAGNOSIS — I69398 Other sequelae of cerebral infarction: Secondary | ICD-10-CM | POA: Diagnosis not present

## 2021-12-05 DIAGNOSIS — Z87442 Personal history of urinary calculi: Secondary | ICD-10-CM | POA: Diagnosis not present

## 2021-12-05 DIAGNOSIS — E440 Moderate protein-calorie malnutrition: Secondary | ICD-10-CM | POA: Diagnosis not present

## 2021-12-05 DIAGNOSIS — Z7982 Long term (current) use of aspirin: Secondary | ICD-10-CM | POA: Diagnosis not present

## 2021-12-05 DIAGNOSIS — R531 Weakness: Secondary | ICD-10-CM | POA: Diagnosis not present

## 2021-12-06 DIAGNOSIS — H903 Sensorineural hearing loss, bilateral: Secondary | ICD-10-CM | POA: Diagnosis not present

## 2021-12-06 DIAGNOSIS — I700 Atherosclerosis of aorta: Secondary | ICD-10-CM | POA: Diagnosis not present

## 2021-12-06 DIAGNOSIS — E785 Hyperlipidemia, unspecified: Secondary | ICD-10-CM | POA: Diagnosis not present

## 2021-12-06 DIAGNOSIS — Z7982 Long term (current) use of aspirin: Secondary | ICD-10-CM | POA: Diagnosis not present

## 2021-12-06 DIAGNOSIS — Z87442 Personal history of urinary calculi: Secondary | ICD-10-CM | POA: Diagnosis not present

## 2021-12-06 DIAGNOSIS — I69398 Other sequelae of cerebral infarction: Secondary | ICD-10-CM | POA: Diagnosis not present

## 2021-12-06 DIAGNOSIS — R531 Weakness: Secondary | ICD-10-CM | POA: Diagnosis not present

## 2021-12-06 DIAGNOSIS — E119 Type 2 diabetes mellitus without complications: Secondary | ICD-10-CM | POA: Diagnosis not present

## 2021-12-06 DIAGNOSIS — I10 Essential (primary) hypertension: Secondary | ICD-10-CM | POA: Diagnosis not present

## 2021-12-06 DIAGNOSIS — I152 Hypertension secondary to endocrine disorders: Secondary | ICD-10-CM | POA: Diagnosis not present

## 2021-12-06 DIAGNOSIS — E1169 Type 2 diabetes mellitus with other specified complication: Secondary | ICD-10-CM | POA: Diagnosis not present

## 2021-12-06 DIAGNOSIS — E440 Moderate protein-calorie malnutrition: Secondary | ICD-10-CM | POA: Diagnosis not present

## 2021-12-06 DIAGNOSIS — I69322 Dysarthria following cerebral infarction: Secondary | ICD-10-CM | POA: Diagnosis not present

## 2021-12-06 DIAGNOSIS — E039 Hypothyroidism, unspecified: Secondary | ICD-10-CM | POA: Diagnosis not present

## 2021-12-06 DIAGNOSIS — Z9181 History of falling: Secondary | ICD-10-CM | POA: Diagnosis not present

## 2021-12-12 DIAGNOSIS — I69322 Dysarthria following cerebral infarction: Secondary | ICD-10-CM | POA: Diagnosis not present

## 2021-12-12 DIAGNOSIS — E119 Type 2 diabetes mellitus without complications: Secondary | ICD-10-CM | POA: Diagnosis not present

## 2021-12-12 DIAGNOSIS — I69398 Other sequelae of cerebral infarction: Secondary | ICD-10-CM | POA: Diagnosis not present

## 2021-12-12 DIAGNOSIS — E785 Hyperlipidemia, unspecified: Secondary | ICD-10-CM | POA: Diagnosis not present

## 2021-12-12 DIAGNOSIS — I10 Essential (primary) hypertension: Secondary | ICD-10-CM | POA: Diagnosis not present

## 2021-12-12 DIAGNOSIS — E440 Moderate protein-calorie malnutrition: Secondary | ICD-10-CM | POA: Diagnosis not present

## 2021-12-12 DIAGNOSIS — R531 Weakness: Secondary | ICD-10-CM | POA: Diagnosis not present

## 2021-12-12 DIAGNOSIS — I700 Atherosclerosis of aorta: Secondary | ICD-10-CM | POA: Diagnosis not present

## 2021-12-12 DIAGNOSIS — Z7982 Long term (current) use of aspirin: Secondary | ICD-10-CM | POA: Diagnosis not present

## 2021-12-12 DIAGNOSIS — Z87442 Personal history of urinary calculi: Secondary | ICD-10-CM | POA: Diagnosis not present

## 2021-12-12 DIAGNOSIS — I152 Hypertension secondary to endocrine disorders: Secondary | ICD-10-CM | POA: Diagnosis not present

## 2021-12-12 DIAGNOSIS — H903 Sensorineural hearing loss, bilateral: Secondary | ICD-10-CM | POA: Diagnosis not present

## 2021-12-12 DIAGNOSIS — Z9181 History of falling: Secondary | ICD-10-CM | POA: Diagnosis not present

## 2021-12-12 DIAGNOSIS — E1169 Type 2 diabetes mellitus with other specified complication: Secondary | ICD-10-CM | POA: Diagnosis not present

## 2021-12-12 DIAGNOSIS — E039 Hypothyroidism, unspecified: Secondary | ICD-10-CM | POA: Diagnosis not present

## 2021-12-13 DIAGNOSIS — Z7982 Long term (current) use of aspirin: Secondary | ICD-10-CM | POA: Diagnosis not present

## 2021-12-13 DIAGNOSIS — I10 Essential (primary) hypertension: Secondary | ICD-10-CM | POA: Diagnosis not present

## 2021-12-13 DIAGNOSIS — I152 Hypertension secondary to endocrine disorders: Secondary | ICD-10-CM | POA: Diagnosis not present

## 2021-12-13 DIAGNOSIS — I69322 Dysarthria following cerebral infarction: Secondary | ICD-10-CM | POA: Diagnosis not present

## 2021-12-13 DIAGNOSIS — I69398 Other sequelae of cerebral infarction: Secondary | ICD-10-CM | POA: Diagnosis not present

## 2021-12-13 DIAGNOSIS — E039 Hypothyroidism, unspecified: Secondary | ICD-10-CM | POA: Diagnosis not present

## 2021-12-13 DIAGNOSIS — E440 Moderate protein-calorie malnutrition: Secondary | ICD-10-CM | POA: Diagnosis not present

## 2021-12-13 DIAGNOSIS — I700 Atherosclerosis of aorta: Secondary | ICD-10-CM | POA: Diagnosis not present

## 2021-12-13 DIAGNOSIS — R531 Weakness: Secondary | ICD-10-CM | POA: Diagnosis not present

## 2021-12-13 DIAGNOSIS — E1169 Type 2 diabetes mellitus with other specified complication: Secondary | ICD-10-CM | POA: Diagnosis not present

## 2021-12-13 DIAGNOSIS — Z87442 Personal history of urinary calculi: Secondary | ICD-10-CM | POA: Diagnosis not present

## 2021-12-13 DIAGNOSIS — Z9181 History of falling: Secondary | ICD-10-CM | POA: Diagnosis not present

## 2021-12-13 DIAGNOSIS — H903 Sensorineural hearing loss, bilateral: Secondary | ICD-10-CM | POA: Diagnosis not present

## 2021-12-13 DIAGNOSIS — E119 Type 2 diabetes mellitus without complications: Secondary | ICD-10-CM | POA: Diagnosis not present

## 2021-12-13 DIAGNOSIS — E785 Hyperlipidemia, unspecified: Secondary | ICD-10-CM | POA: Diagnosis not present

## 2021-12-18 DIAGNOSIS — I10 Essential (primary) hypertension: Secondary | ICD-10-CM | POA: Diagnosis not present

## 2021-12-18 DIAGNOSIS — I152 Hypertension secondary to endocrine disorders: Secondary | ICD-10-CM | POA: Diagnosis not present

## 2021-12-18 DIAGNOSIS — E039 Hypothyroidism, unspecified: Secondary | ICD-10-CM | POA: Diagnosis not present

## 2021-12-18 DIAGNOSIS — I700 Atherosclerosis of aorta: Secondary | ICD-10-CM | POA: Diagnosis not present

## 2021-12-18 DIAGNOSIS — E119 Type 2 diabetes mellitus without complications: Secondary | ICD-10-CM | POA: Diagnosis not present

## 2021-12-18 DIAGNOSIS — I69322 Dysarthria following cerebral infarction: Secondary | ICD-10-CM | POA: Diagnosis not present

## 2021-12-18 DIAGNOSIS — R531 Weakness: Secondary | ICD-10-CM | POA: Diagnosis not present

## 2021-12-18 DIAGNOSIS — Z7982 Long term (current) use of aspirin: Secondary | ICD-10-CM | POA: Diagnosis not present

## 2021-12-18 DIAGNOSIS — E440 Moderate protein-calorie malnutrition: Secondary | ICD-10-CM | POA: Diagnosis not present

## 2021-12-18 DIAGNOSIS — H903 Sensorineural hearing loss, bilateral: Secondary | ICD-10-CM | POA: Diagnosis not present

## 2021-12-18 DIAGNOSIS — I69398 Other sequelae of cerebral infarction: Secondary | ICD-10-CM | POA: Diagnosis not present

## 2021-12-18 DIAGNOSIS — Z9181 History of falling: Secondary | ICD-10-CM | POA: Diagnosis not present

## 2021-12-18 DIAGNOSIS — E1169 Type 2 diabetes mellitus with other specified complication: Secondary | ICD-10-CM | POA: Diagnosis not present

## 2021-12-18 DIAGNOSIS — Z87442 Personal history of urinary calculi: Secondary | ICD-10-CM | POA: Diagnosis not present

## 2021-12-18 DIAGNOSIS — E785 Hyperlipidemia, unspecified: Secondary | ICD-10-CM | POA: Diagnosis not present

## 2021-12-20 DIAGNOSIS — Z9181 History of falling: Secondary | ICD-10-CM | POA: Diagnosis not present

## 2021-12-20 DIAGNOSIS — H903 Sensorineural hearing loss, bilateral: Secondary | ICD-10-CM | POA: Diagnosis not present

## 2021-12-20 DIAGNOSIS — R531 Weakness: Secondary | ICD-10-CM | POA: Diagnosis not present

## 2021-12-20 DIAGNOSIS — I152 Hypertension secondary to endocrine disorders: Secondary | ICD-10-CM | POA: Diagnosis not present

## 2021-12-20 DIAGNOSIS — I700 Atherosclerosis of aorta: Secondary | ICD-10-CM | POA: Diagnosis not present

## 2021-12-20 DIAGNOSIS — E785 Hyperlipidemia, unspecified: Secondary | ICD-10-CM | POA: Diagnosis not present

## 2021-12-20 DIAGNOSIS — Z7982 Long term (current) use of aspirin: Secondary | ICD-10-CM | POA: Diagnosis not present

## 2021-12-20 DIAGNOSIS — I69322 Dysarthria following cerebral infarction: Secondary | ICD-10-CM | POA: Diagnosis not present

## 2021-12-20 DIAGNOSIS — E440 Moderate protein-calorie malnutrition: Secondary | ICD-10-CM | POA: Diagnosis not present

## 2021-12-20 DIAGNOSIS — E039 Hypothyroidism, unspecified: Secondary | ICD-10-CM | POA: Diagnosis not present

## 2021-12-20 DIAGNOSIS — E1169 Type 2 diabetes mellitus with other specified complication: Secondary | ICD-10-CM | POA: Diagnosis not present

## 2021-12-20 DIAGNOSIS — I10 Essential (primary) hypertension: Secondary | ICD-10-CM | POA: Diagnosis not present

## 2021-12-20 DIAGNOSIS — E119 Type 2 diabetes mellitus without complications: Secondary | ICD-10-CM | POA: Diagnosis not present

## 2021-12-20 DIAGNOSIS — Z87442 Personal history of urinary calculi: Secondary | ICD-10-CM | POA: Diagnosis not present

## 2021-12-20 DIAGNOSIS — I69398 Other sequelae of cerebral infarction: Secondary | ICD-10-CM | POA: Diagnosis not present

## 2021-12-29 DIAGNOSIS — I69398 Other sequelae of cerebral infarction: Secondary | ICD-10-CM | POA: Diagnosis not present

## 2021-12-29 DIAGNOSIS — E785 Hyperlipidemia, unspecified: Secondary | ICD-10-CM | POA: Diagnosis not present

## 2021-12-29 DIAGNOSIS — E440 Moderate protein-calorie malnutrition: Secondary | ICD-10-CM | POA: Diagnosis not present

## 2021-12-29 DIAGNOSIS — E1169 Type 2 diabetes mellitus with other specified complication: Secondary | ICD-10-CM | POA: Diagnosis not present

## 2021-12-29 DIAGNOSIS — R531 Weakness: Secondary | ICD-10-CM | POA: Diagnosis not present

## 2021-12-29 DIAGNOSIS — I69322 Dysarthria following cerebral infarction: Secondary | ICD-10-CM | POA: Diagnosis not present

## 2021-12-29 DIAGNOSIS — I700 Atherosclerosis of aorta: Secondary | ICD-10-CM | POA: Diagnosis not present

## 2021-12-29 DIAGNOSIS — Z9181 History of falling: Secondary | ICD-10-CM | POA: Diagnosis not present

## 2021-12-29 DIAGNOSIS — Z87442 Personal history of urinary calculi: Secondary | ICD-10-CM | POA: Diagnosis not present

## 2021-12-29 DIAGNOSIS — Z7982 Long term (current) use of aspirin: Secondary | ICD-10-CM | POA: Diagnosis not present

## 2021-12-29 DIAGNOSIS — I152 Hypertension secondary to endocrine disorders: Secondary | ICD-10-CM | POA: Diagnosis not present

## 2021-12-29 DIAGNOSIS — E039 Hypothyroidism, unspecified: Secondary | ICD-10-CM | POA: Diagnosis not present

## 2021-12-29 DIAGNOSIS — I10 Essential (primary) hypertension: Secondary | ICD-10-CM | POA: Diagnosis not present

## 2021-12-29 DIAGNOSIS — E119 Type 2 diabetes mellitus without complications: Secondary | ICD-10-CM | POA: Diagnosis not present

## 2021-12-29 DIAGNOSIS — H903 Sensorineural hearing loss, bilateral: Secondary | ICD-10-CM | POA: Diagnosis not present

## 2022-01-04 DIAGNOSIS — E119 Type 2 diabetes mellitus without complications: Secondary | ICD-10-CM | POA: Diagnosis not present

## 2022-01-04 DIAGNOSIS — Z87442 Personal history of urinary calculi: Secondary | ICD-10-CM | POA: Diagnosis not present

## 2022-01-04 DIAGNOSIS — E039 Hypothyroidism, unspecified: Secondary | ICD-10-CM | POA: Diagnosis not present

## 2022-01-04 DIAGNOSIS — Z7982 Long term (current) use of aspirin: Secondary | ICD-10-CM | POA: Diagnosis not present

## 2022-01-04 DIAGNOSIS — E1169 Type 2 diabetes mellitus with other specified complication: Secondary | ICD-10-CM | POA: Diagnosis not present

## 2022-01-04 DIAGNOSIS — R531 Weakness: Secondary | ICD-10-CM | POA: Diagnosis not present

## 2022-01-04 DIAGNOSIS — I10 Essential (primary) hypertension: Secondary | ICD-10-CM | POA: Diagnosis not present

## 2022-01-04 DIAGNOSIS — I152 Hypertension secondary to endocrine disorders: Secondary | ICD-10-CM | POA: Diagnosis not present

## 2022-01-04 DIAGNOSIS — I69322 Dysarthria following cerebral infarction: Secondary | ICD-10-CM | POA: Diagnosis not present

## 2022-01-04 DIAGNOSIS — I69398 Other sequelae of cerebral infarction: Secondary | ICD-10-CM | POA: Diagnosis not present

## 2022-01-04 DIAGNOSIS — I700 Atherosclerosis of aorta: Secondary | ICD-10-CM | POA: Diagnosis not present

## 2022-01-04 DIAGNOSIS — Z9181 History of falling: Secondary | ICD-10-CM | POA: Diagnosis not present

## 2022-01-04 DIAGNOSIS — H903 Sensorineural hearing loss, bilateral: Secondary | ICD-10-CM | POA: Diagnosis not present

## 2022-01-04 DIAGNOSIS — E785 Hyperlipidemia, unspecified: Secondary | ICD-10-CM | POA: Diagnosis not present

## 2022-01-04 DIAGNOSIS — E440 Moderate protein-calorie malnutrition: Secondary | ICD-10-CM | POA: Diagnosis not present

## 2022-02-06 DIAGNOSIS — I69322 Dysarthria following cerebral infarction: Secondary | ICD-10-CM | POA: Diagnosis not present

## 2022-02-06 DIAGNOSIS — I10 Essential (primary) hypertension: Secondary | ICD-10-CM | POA: Diagnosis not present

## 2022-02-06 DIAGNOSIS — H6123 Impacted cerumen, bilateral: Secondary | ICD-10-CM | POA: Diagnosis not present

## 2022-02-06 DIAGNOSIS — R1310 Dysphagia, unspecified: Secondary | ICD-10-CM | POA: Diagnosis not present

## 2022-02-06 DIAGNOSIS — H903 Sensorineural hearing loss, bilateral: Secondary | ICD-10-CM | POA: Diagnosis not present

## 2022-02-20 DIAGNOSIS — R1310 Dysphagia, unspecified: Secondary | ICD-10-CM | POA: Diagnosis not present

## 2022-02-20 DIAGNOSIS — H903 Sensorineural hearing loss, bilateral: Secondary | ICD-10-CM | POA: Diagnosis not present

## 2022-02-20 DIAGNOSIS — I10 Essential (primary) hypertension: Secondary | ICD-10-CM | POA: Diagnosis not present

## 2022-02-20 DIAGNOSIS — E440 Moderate protein-calorie malnutrition: Secondary | ICD-10-CM | POA: Diagnosis not present

## 2022-02-20 DIAGNOSIS — I69391 Dysphagia following cerebral infarction: Secondary | ICD-10-CM | POA: Diagnosis not present

## 2022-02-20 DIAGNOSIS — H6123 Impacted cerumen, bilateral: Secondary | ICD-10-CM | POA: Diagnosis not present

## 2022-02-20 DIAGNOSIS — E039 Hypothyroidism, unspecified: Secondary | ICD-10-CM | POA: Diagnosis not present

## 2022-02-20 DIAGNOSIS — E785 Hyperlipidemia, unspecified: Secondary | ICD-10-CM | POA: Diagnosis not present

## 2022-02-20 DIAGNOSIS — Z87442 Personal history of urinary calculi: Secondary | ICD-10-CM | POA: Diagnosis not present

## 2022-02-20 DIAGNOSIS — E119 Type 2 diabetes mellitus without complications: Secondary | ICD-10-CM | POA: Diagnosis not present

## 2022-02-20 DIAGNOSIS — I69322 Dysarthria following cerebral infarction: Secondary | ICD-10-CM | POA: Diagnosis not present

## 2022-02-20 DIAGNOSIS — I700 Atherosclerosis of aorta: Secondary | ICD-10-CM | POA: Diagnosis not present

## 2022-02-20 DIAGNOSIS — Z7982 Long term (current) use of aspirin: Secondary | ICD-10-CM | POA: Diagnosis not present

## 2022-02-28 ENCOUNTER — Other Ambulatory Visit (HOSPITAL_COMMUNITY): Payer: Self-pay

## 2022-02-28 DIAGNOSIS — I69391 Dysphagia following cerebral infarction: Secondary | ICD-10-CM | POA: Diagnosis not present

## 2022-02-28 DIAGNOSIS — E440 Moderate protein-calorie malnutrition: Secondary | ICD-10-CM | POA: Diagnosis not present

## 2022-02-28 DIAGNOSIS — I69322 Dysarthria following cerebral infarction: Secondary | ICD-10-CM | POA: Diagnosis not present

## 2022-02-28 DIAGNOSIS — H903 Sensorineural hearing loss, bilateral: Secondary | ICD-10-CM | POA: Diagnosis not present

## 2022-02-28 DIAGNOSIS — E039 Hypothyroidism, unspecified: Secondary | ICD-10-CM | POA: Diagnosis not present

## 2022-02-28 DIAGNOSIS — H6123 Impacted cerumen, bilateral: Secondary | ICD-10-CM | POA: Diagnosis not present

## 2022-02-28 DIAGNOSIS — I700 Atherosclerosis of aorta: Secondary | ICD-10-CM | POA: Diagnosis not present

## 2022-02-28 DIAGNOSIS — Z87442 Personal history of urinary calculi: Secondary | ICD-10-CM | POA: Diagnosis not present

## 2022-02-28 DIAGNOSIS — I10 Essential (primary) hypertension: Secondary | ICD-10-CM | POA: Diagnosis not present

## 2022-02-28 DIAGNOSIS — E119 Type 2 diabetes mellitus without complications: Secondary | ICD-10-CM | POA: Diagnosis not present

## 2022-02-28 DIAGNOSIS — R1310 Dysphagia, unspecified: Secondary | ICD-10-CM | POA: Diagnosis not present

## 2022-02-28 DIAGNOSIS — E785 Hyperlipidemia, unspecified: Secondary | ICD-10-CM | POA: Diagnosis not present

## 2022-02-28 DIAGNOSIS — Z7982 Long term (current) use of aspirin: Secondary | ICD-10-CM | POA: Diagnosis not present

## 2022-03-07 ENCOUNTER — Ambulatory Visit (HOSPITAL_COMMUNITY)
Admission: RE | Admit: 2022-03-07 | Discharge: 2022-03-07 | Disposition: A | Discharge status: Home / Self Care | Payer: Medicare Other | Source: Ambulatory Visit | Attending: Family Medicine | Admitting: Family Medicine

## 2022-03-07 DIAGNOSIS — R1310 Dysphagia, unspecified: Secondary | ICD-10-CM | POA: Diagnosis not present

## 2022-03-07 DIAGNOSIS — R059 Cough, unspecified: Secondary | ICD-10-CM | POA: Diagnosis not present

## 2022-03-09 DIAGNOSIS — I69391 Dysphagia following cerebral infarction: Secondary | ICD-10-CM | POA: Diagnosis not present

## 2022-03-09 DIAGNOSIS — H903 Sensorineural hearing loss, bilateral: Secondary | ICD-10-CM | POA: Diagnosis not present

## 2022-03-09 DIAGNOSIS — Z87442 Personal history of urinary calculi: Secondary | ICD-10-CM | POA: Diagnosis not present

## 2022-03-09 DIAGNOSIS — H6123 Impacted cerumen, bilateral: Secondary | ICD-10-CM | POA: Diagnosis not present

## 2022-03-09 DIAGNOSIS — E039 Hypothyroidism, unspecified: Secondary | ICD-10-CM | POA: Diagnosis not present

## 2022-03-09 DIAGNOSIS — I700 Atherosclerosis of aorta: Secondary | ICD-10-CM | POA: Diagnosis not present

## 2022-03-09 DIAGNOSIS — E440 Moderate protein-calorie malnutrition: Secondary | ICD-10-CM | POA: Diagnosis not present

## 2022-03-09 DIAGNOSIS — Z7982 Long term (current) use of aspirin: Secondary | ICD-10-CM | POA: Diagnosis not present

## 2022-03-09 DIAGNOSIS — I69322 Dysarthria following cerebral infarction: Secondary | ICD-10-CM | POA: Diagnosis not present

## 2022-03-09 DIAGNOSIS — I10 Essential (primary) hypertension: Secondary | ICD-10-CM | POA: Diagnosis not present

## 2022-03-09 DIAGNOSIS — E119 Type 2 diabetes mellitus without complications: Secondary | ICD-10-CM | POA: Diagnosis not present

## 2022-03-09 DIAGNOSIS — R1310 Dysphagia, unspecified: Secondary | ICD-10-CM | POA: Diagnosis not present

## 2022-03-09 DIAGNOSIS — E785 Hyperlipidemia, unspecified: Secondary | ICD-10-CM | POA: Diagnosis not present

## 2022-04-13 DIAGNOSIS — N390 Urinary tract infection, site not specified: Secondary | ICD-10-CM | POA: Diagnosis not present

## 2022-07-20 ENCOUNTER — Other Ambulatory Visit: Payer: Self-pay

## 2022-07-20 ENCOUNTER — Observation Stay (HOSPITAL_COMMUNITY): Payer: Medicare Other

## 2022-07-20 ENCOUNTER — Inpatient Hospital Stay (HOSPITAL_COMMUNITY)
Admission: EM | Admit: 2022-07-20 | Discharge: 2022-07-31 | DRG: 853 | Disposition: A | Discharge status: Skilled Nursing Facility | Payer: Medicare Other | Attending: Internal Medicine | Admitting: Internal Medicine

## 2022-07-20 ENCOUNTER — Encounter (HOSPITAL_COMMUNITY): Payer: Self-pay

## 2022-07-20 ENCOUNTER — Emergency Department (HOSPITAL_COMMUNITY): Payer: Medicare Other

## 2022-07-20 ENCOUNTER — Observation Stay (HOSPITAL_COMMUNITY): Payer: Medicare Other | Admitting: Anesthesiology

## 2022-07-20 ENCOUNTER — Encounter (HOSPITAL_COMMUNITY)
Admission: EM | Disposition: A | Discharge status: Skilled Nursing Facility | Payer: Self-pay | Source: Home / Self Care | Attending: Internal Medicine

## 2022-07-20 ENCOUNTER — Observation Stay (HOSPITAL_BASED_OUTPATIENT_CLINIC_OR_DEPARTMENT_OTHER): Payer: Medicare Other | Admitting: Anesthesiology

## 2022-07-20 DIAGNOSIS — I6389 Other cerebral infarction: Secondary | ICD-10-CM | POA: Diagnosis not present

## 2022-07-20 DIAGNOSIS — M79672 Pain in left foot: Secondary | ICD-10-CM | POA: Diagnosis present

## 2022-07-20 DIAGNOSIS — E039 Hypothyroidism, unspecified: Secondary | ICD-10-CM | POA: Diagnosis present

## 2022-07-20 DIAGNOSIS — R270 Ataxia, unspecified: Secondary | ICD-10-CM | POA: Diagnosis not present

## 2022-07-20 DIAGNOSIS — E785 Hyperlipidemia, unspecified: Secondary | ICD-10-CM | POA: Diagnosis present

## 2022-07-20 DIAGNOSIS — R1312 Dysphagia, oropharyngeal phase: Secondary | ICD-10-CM | POA: Diagnosis present

## 2022-07-20 DIAGNOSIS — R0609 Other forms of dyspnea: Secondary | ICD-10-CM | POA: Diagnosis not present

## 2022-07-20 DIAGNOSIS — G629 Polyneuropathy, unspecified: Secondary | ICD-10-CM | POA: Diagnosis present

## 2022-07-20 DIAGNOSIS — R32 Unspecified urinary incontinence: Secondary | ICD-10-CM | POA: Diagnosis present

## 2022-07-20 DIAGNOSIS — R471 Dysarthria and anarthria: Secondary | ICD-10-CM | POA: Diagnosis not present

## 2022-07-20 DIAGNOSIS — R9082 White matter disease, unspecified: Secondary | ICD-10-CM | POA: Diagnosis not present

## 2022-07-20 DIAGNOSIS — M79671 Pain in right foot: Secondary | ICD-10-CM | POA: Diagnosis present

## 2022-07-20 DIAGNOSIS — Z8679 Personal history of other diseases of the circulatory system: Secondary | ICD-10-CM

## 2022-07-20 DIAGNOSIS — J9601 Acute respiratory failure with hypoxia: Secondary | ICD-10-CM | POA: Diagnosis present

## 2022-07-20 DIAGNOSIS — Z20822 Contact with and (suspected) exposure to covid-19: Secondary | ICD-10-CM | POA: Diagnosis not present

## 2022-07-20 DIAGNOSIS — Z603 Acculturation difficulty: Secondary | ICD-10-CM | POA: Diagnosis present

## 2022-07-20 DIAGNOSIS — G8191 Hemiplegia, unspecified affecting right dominant side: Secondary | ICD-10-CM | POA: Diagnosis present

## 2022-07-20 DIAGNOSIS — Z9181 History of falling: Secondary | ICD-10-CM

## 2022-07-20 DIAGNOSIS — M47812 Spondylosis without myelopathy or radiculopathy, cervical region: Secondary | ICD-10-CM | POA: Diagnosis not present

## 2022-07-20 DIAGNOSIS — K3580 Unspecified acute appendicitis: Secondary | ICD-10-CM | POA: Diagnosis present

## 2022-07-20 DIAGNOSIS — I10 Essential (primary) hypertension: Secondary | ICD-10-CM | POA: Diagnosis present

## 2022-07-20 DIAGNOSIS — R6520 Severe sepsis without septic shock: Secondary | ICD-10-CM | POA: Diagnosis not present

## 2022-07-20 DIAGNOSIS — K3532 Acute appendicitis with perforation and localized peritonitis, without abscess: Secondary | ICD-10-CM | POA: Diagnosis not present

## 2022-07-20 DIAGNOSIS — G9340 Encephalopathy, unspecified: Secondary | ICD-10-CM | POA: Diagnosis not present

## 2022-07-20 DIAGNOSIS — G9341 Metabolic encephalopathy: Secondary | ICD-10-CM | POA: Diagnosis present

## 2022-07-20 DIAGNOSIS — J810 Acute pulmonary edema: Secondary | ICD-10-CM | POA: Diagnosis not present

## 2022-07-20 DIAGNOSIS — K35891 Other acute appendicitis without perforation, with gangrene: Secondary | ICD-10-CM | POA: Diagnosis present

## 2022-07-20 DIAGNOSIS — K37 Unspecified appendicitis: Secondary | ICD-10-CM | POA: Diagnosis not present

## 2022-07-20 DIAGNOSIS — J984 Other disorders of lung: Secondary | ICD-10-CM | POA: Diagnosis not present

## 2022-07-20 DIAGNOSIS — E034 Atrophy of thyroid (acquired): Secondary | ICD-10-CM | POA: Diagnosis not present

## 2022-07-20 DIAGNOSIS — R0689 Other abnormalities of breathing: Secondary | ICD-10-CM | POA: Diagnosis not present

## 2022-07-20 DIAGNOSIS — A419 Sepsis, unspecified organism: Principal | ICD-10-CM

## 2022-07-20 DIAGNOSIS — I499 Cardiac arrhythmia, unspecified: Secondary | ICD-10-CM | POA: Diagnosis not present

## 2022-07-20 DIAGNOSIS — R000 Tachycardia, unspecified: Secondary | ICD-10-CM | POA: Diagnosis not present

## 2022-07-20 DIAGNOSIS — R531 Weakness: Secondary | ICD-10-CM | POA: Diagnosis not present

## 2022-07-20 DIAGNOSIS — I639 Cerebral infarction, unspecified: Secondary | ICD-10-CM

## 2022-07-20 DIAGNOSIS — Z789 Other specified health status: Secondary | ICD-10-CM

## 2022-07-20 DIAGNOSIS — J432 Centrilobular emphysema: Secondary | ICD-10-CM | POA: Diagnosis not present

## 2022-07-20 DIAGNOSIS — K760 Fatty (change of) liver, not elsewhere classified: Secondary | ICD-10-CM | POA: Diagnosis not present

## 2022-07-20 DIAGNOSIS — M4312 Spondylolisthesis, cervical region: Secondary | ICD-10-CM | POA: Diagnosis not present

## 2022-07-20 DIAGNOSIS — R1031 Right lower quadrant pain: Secondary | ICD-10-CM | POA: Diagnosis not present

## 2022-07-20 DIAGNOSIS — R0602 Shortness of breath: Secondary | ICD-10-CM | POA: Diagnosis not present

## 2022-07-20 HISTORY — PX: LAPAROSCOPIC APPENDECTOMY: SHX408

## 2022-07-20 LAB — URINALYSIS, ROUTINE W REFLEX MICROSCOPIC
Bacteria, UA: NONE SEEN
Bilirubin Urine: NEGATIVE
Glucose, UA: NEGATIVE mg/dL
Hgb urine dipstick: NEGATIVE
Ketones, ur: 20 mg/dL — AB
Leukocytes,Ua: NEGATIVE
Nitrite: NEGATIVE
Protein, ur: 30 mg/dL — AB
Specific Gravity, Urine: 1.01 (ref 1.005–1.030)
pH: 6 (ref 5.0–8.0)

## 2022-07-20 LAB — CBC WITH DIFFERENTIAL/PLATELET
Abs Immature Granulocytes: 0.05 10*3/uL (ref 0.00–0.07)
Basophils Absolute: 0 10*3/uL (ref 0.0–0.1)
Basophils Relative: 0 %
Eosinophils Absolute: 0.1 10*3/uL (ref 0.0–0.5)
Eosinophils Relative: 0 %
HCT: 45.6 % (ref 39.0–52.0)
Hemoglobin: 15.6 g/dL (ref 13.0–17.0)
Immature Granulocytes: 0 %
Lymphocytes Relative: 8 %
Lymphs Abs: 1 10*3/uL (ref 0.7–4.0)
MCH: 32.5 pg (ref 26.0–34.0)
MCHC: 34.2 g/dL (ref 30.0–36.0)
MCV: 95 fL (ref 80.0–100.0)
Monocytes Absolute: 1.2 10*3/uL — ABNORMAL HIGH (ref 0.1–1.0)
Monocytes Relative: 9 %
Neutro Abs: 10.6 10*3/uL — ABNORMAL HIGH (ref 1.7–7.7)
Neutrophils Relative %: 83 %
Platelets: 172 10*3/uL (ref 150–400)
RBC: 4.8 MIL/uL (ref 4.22–5.81)
RDW: 12.5 % (ref 11.5–15.5)
WBC: 12.8 10*3/uL — ABNORMAL HIGH (ref 4.0–10.5)
nRBC: 0 % (ref 0.0–0.2)

## 2022-07-20 LAB — CBG MONITORING, ED: Glucose-Capillary: 174 mg/dL — ABNORMAL HIGH (ref 70–99)

## 2022-07-20 LAB — COMPREHENSIVE METABOLIC PANEL
ALT: 18 U/L (ref 0–44)
AST: 26 U/L (ref 15–41)
Albumin: 4.1 g/dL (ref 3.5–5.0)
Alkaline Phosphatase: 32 U/L — ABNORMAL LOW (ref 38–126)
Anion gap: 10 (ref 5–15)
BUN: 10 mg/dL (ref 8–23)
CO2: 25 mmol/L (ref 22–32)
Calcium: 9.3 mg/dL (ref 8.9–10.3)
Chloride: 101 mmol/L (ref 98–111)
Creatinine, Ser: 0.79 mg/dL (ref 0.61–1.24)
GFR, Estimated: >60 mL/min (ref >60)
Glucose, Bld: 186 mg/dL — ABNORMAL HIGH (ref 70–99)
Potassium: 4.4 mmol/L (ref 3.5–5.1)
Sodium: 136 mmol/L (ref 135–145)
Total Bilirubin: 1.5 mg/dL — ABNORMAL HIGH (ref 0.3–1.2)
Total Protein: 7.6 g/dL (ref 6.5–8.1)

## 2022-07-20 LAB — LACTIC ACID, PLASMA
Lactic Acid, Venous: 3.9 mmol/L (ref 0.5–1.9)
Lactic Acid, Venous: 3.5 mmol/L (ref 0.5–1.9)
Lactic Acid, Venous: 4.5 mmol/L (ref 0.5–1.9)

## 2022-07-20 LAB — AMMONIA: Ammonia: 36 umol/L — ABNORMAL HIGH (ref 9–35)

## 2022-07-20 LAB — RESP PANEL BY RT-PCR (FLU A&B, COVID) ARPGX2
Influenza A by PCR: NEGATIVE
Influenza B by PCR: NEGATIVE
SARS Coronavirus 2 by RT PCR: NEGATIVE

## 2022-07-20 LAB — APTT: aPTT: 32 seconds (ref 24–36)

## 2022-07-20 LAB — BRAIN NATRIURETIC PEPTIDE: B Natriuretic Peptide: 195.5 pg/mL — ABNORMAL HIGH (ref 0.0–100.0)

## 2022-07-20 LAB — TSH: TSH: 1.653 u[IU]/mL (ref 0.350–4.500)

## 2022-07-20 LAB — VITAMIN B12: Vitamin B-12: 272 pg/mL (ref 180–914)

## 2022-07-20 LAB — PROTIME-INR
INR: 1.3 — ABNORMAL HIGH (ref 0.8–1.2)
Prothrombin Time: 16.5 seconds — ABNORMAL HIGH (ref 11.4–15.2)

## 2022-07-20 LAB — MRSA NEXT GEN BY PCR, NASAL: MRSA by PCR Next Gen: NOT DETECTED

## 2022-07-20 SURGERY — APPENDECTOMY, LAPAROSCOPIC
Anesthesia: General

## 2022-07-20 MED ORDER — SODIUM CHLORIDE 0.9 % IV SOLN
2.0000 g | Freq: Once | INTRAVENOUS | Status: DC
  Filled 2022-07-20: qty 12.5

## 2022-07-20 MED ORDER — HALOPERIDOL LACTATE 5 MG/ML IJ SOLN
2.0000 mg | Freq: Four times a day (QID) | INTRAMUSCULAR | Status: DC | PRN
Start: 2022-07-20 — End: 2022-07-23

## 2022-07-20 MED ORDER — LIDOCAINE 2% (20 MG/ML) 5 ML SYRINGE
INTRAMUSCULAR | Status: DC | PRN
  Administered 2022-07-20: 60 mg via INTRAVENOUS

## 2022-07-20 MED ORDER — TRAMADOL HCL 50 MG PO TABS: 50.0000 mg | ORAL_TABLET | Freq: Four times a day (QID) | ORAL | Status: DC | PRN

## 2022-07-20 MED ORDER — ROCURONIUM BROMIDE 10 MG/ML (PF) SYRINGE
PREFILLED_SYRINGE | INTRAVENOUS | Status: AC
  Filled 2022-07-20: qty 10

## 2022-07-20 MED ORDER — PHENYLEPHRINE 80 MCG/ML (10ML) SYRINGE FOR IV PUSH (FOR BLOOD PRESSURE SUPPORT)
PREFILLED_SYRINGE | INTRAVENOUS | Status: DC | PRN
  Administered 2022-07-20: 160 ug via INTRAVENOUS

## 2022-07-20 MED ORDER — LACTATED RINGERS IV SOLN: INTRAVENOUS | Status: DC

## 2022-07-20 MED ORDER — SODIUM CHLORIDE 0.9 % IV SOLN: INTRAVENOUS | Status: DC | PRN

## 2022-07-20 MED ORDER — IPRATROPIUM-ALBUTEROL 0.5-2.5 (3) MG/3ML IN SOLN
3.0000 mL | RESPIRATORY_TRACT | Status: DC | PRN
  Administered 2022-07-23: 3 mL via RESPIRATORY_TRACT
  Filled 2022-07-20: qty 3

## 2022-07-20 MED ORDER — LEVALBUTEROL HCL 0.63 MG/3ML IN NEBU
INHALATION_SOLUTION | RESPIRATORY_TRACT | Status: AC
  Filled 2022-07-20: qty 3

## 2022-07-20 MED ORDER — LACTATED RINGERS IV BOLUS (SEPSIS)
1000.0000 mL | Freq: Once | INTRAVENOUS | Status: AC
  Administered 2022-07-20: 1000 mL via INTRAVENOUS

## 2022-07-20 MED ORDER — DIPHENHYDRAMINE HCL 50 MG/ML IJ SOLN
12.5000 mg | Freq: Four times a day (QID) | INTRAMUSCULAR | Status: DC | PRN
Start: 2022-07-20 — End: 2022-07-20

## 2022-07-20 MED ORDER — METOPROLOL TARTRATE 5 MG/5ML IV SOLN: 5.0000 mg | Freq: Four times a day (QID) | INTRAVENOUS | Status: DC | PRN

## 2022-07-20 MED ORDER — METRONIDAZOLE 500 MG/100ML IV SOLN
500.0000 mg | Freq: Once | INTRAVENOUS | Status: AC
  Administered 2022-07-20: 500 mg via INTRAVENOUS
  Filled 2022-07-20: qty 100

## 2022-07-20 MED ORDER — ACETAMINOPHEN 325 MG PO TABS: 650.0000 mg | ORAL_TABLET | Freq: Four times a day (QID) | ORAL | Status: DC | PRN

## 2022-07-20 MED ORDER — DEXAMETHASONE SODIUM PHOSPHATE 10 MG/ML IJ SOLN
INTRAMUSCULAR | Status: DC | PRN
  Administered 2022-07-20: 4 mg via INTRAVENOUS

## 2022-07-20 MED ORDER — SODIUM CHLORIDE (PF) 0.9 % IJ SOLN
INTRAMUSCULAR | Status: AC
  Filled 2022-07-20: qty 50

## 2022-07-20 MED ORDER — VANCOMYCIN HCL IN DEXTROSE 1-5 GM/200ML-% IV SOLN
1000.0000 mg | Freq: Once | INTRAVENOUS | Status: DC
  Filled 2022-07-20: qty 200

## 2022-07-20 MED ORDER — ONDANSETRON HCL 4 MG/2ML IJ SOLN
INTRAMUSCULAR | Status: DC | PRN
  Administered 2022-07-20: 4 mg via INTRAVENOUS

## 2022-07-20 MED ORDER — SUGAMMADEX SODIUM 200 MG/2ML IV SOLN
INTRAVENOUS | Status: DC | PRN
  Administered 2022-07-20 (×2): 200 mg via INTRAVENOUS

## 2022-07-20 MED ORDER — LACTATED RINGERS IV BOLUS (SEPSIS)
500.0000 mL | Freq: Once | INTRAVENOUS | Status: AC
  Administered 2022-07-20: 500 mL via INTRAVENOUS

## 2022-07-20 MED ORDER — SODIUM CHLORIDE 0.9 % IV SOLN
2.0000 g | Freq: Three times a day (TID) | INTRAVENOUS | Status: DC
  Administered 2022-07-20 – 2022-07-21 (×2): 2 g via INTRAVENOUS
  Filled 2022-07-20 (×2): qty 12.5

## 2022-07-20 MED ORDER — BUPIVACAINE-EPINEPHRINE (PF) 0.25% -1:200000 IJ SOLN
INTRAMUSCULAR | Status: AC
  Filled 2022-07-20: qty 60

## 2022-07-20 MED ORDER — PROPOFOL 10 MG/ML IV BOLUS
INTRAVENOUS | Status: DC | PRN
  Administered 2022-07-20: 80 mg via INTRAVENOUS

## 2022-07-20 MED ORDER — LOSARTAN POTASSIUM 25 MG PO TABS
25.0000 mg | ORAL_TABLET | Freq: Every day | ORAL | Status: DC
  Administered 2022-07-22 – 2022-07-26 (×5): 25 mg via ORAL
  Filled 2022-07-20 (×7): qty 1

## 2022-07-20 MED ORDER — FUROSEMIDE 10 MG/ML IJ SOLN
INTRAMUSCULAR | Status: AC
  Filled 2022-07-20: qty 4

## 2022-07-20 MED ORDER — LEVOTHYROXINE SODIUM 50 MCG PO TABS
50.0000 ug | ORAL_TABLET | Freq: Every day | ORAL | Status: DC
  Administered 2022-07-22 – 2022-07-26 (×5): 50 ug via ORAL
  Filled 2022-07-20 (×5): qty 1

## 2022-07-20 MED ORDER — ORAL CARE MOUTH RINSE: 15.0000 mL | OROMUCOSAL | Status: DC | PRN

## 2022-07-20 MED ORDER — ACETAMINOPHEN 325 MG PO TABS
650.0000 mg | ORAL_TABLET | Freq: Once | ORAL | Status: AC
  Administered 2022-07-20: 650 mg via ORAL
  Filled 2022-07-20: qty 2

## 2022-07-20 MED ORDER — CHLORHEXIDINE GLUCONATE 0.12 % MT SOLN
15.0000 mL | Freq: Once | OROMUCOSAL | Status: AC
  Administered 2022-07-20: 15 mL via OROMUCOSAL

## 2022-07-20 MED ORDER — CHLORHEXIDINE GLUCONATE CLOTH 2 % EX PADS
6.0000 | MEDICATED_PAD | Freq: Every day | CUTANEOUS | Status: DC
  Administered 2022-07-20 – 2022-07-27 (×5): 6 via TOPICAL

## 2022-07-20 MED ORDER — LEVOTHYROXINE SODIUM 50 MCG PO TABS: 50.0000 ug | ORAL_TABLET | Freq: Every day | ORAL | Status: DC

## 2022-07-20 MED ORDER — AMLODIPINE BESYLATE 5 MG PO TABS: 5.0000 mg | ORAL_TABLET | ORAL | Status: DC

## 2022-07-20 MED ORDER — ONDANSETRON 4 MG PO TBDP: 4.0000 mg | ORAL_TABLET | Freq: Four times a day (QID) | ORAL | Status: DC | PRN

## 2022-07-20 MED ORDER — IOHEXOL 300 MG/ML  SOLN
100.0000 mL | Freq: Once | INTRAMUSCULAR | Status: AC | PRN
  Administered 2022-07-20: 100 mL via INTRAVENOUS

## 2022-07-20 MED ORDER — FENTANYL CITRATE PF 50 MCG/ML IJ SOSY: 25.0000 ug | PREFILLED_SYRINGE | INTRAMUSCULAR | Status: DC | PRN

## 2022-07-20 MED ORDER — LIDOCAINE HCL (PF) 2 % IJ SOLN
INTRAMUSCULAR | Status: AC
  Filled 2022-07-20: qty 5

## 2022-07-20 MED ORDER — LACTATED RINGERS IV SOLN
INTRAVENOUS | Status: AC | PRN
  Administered 2022-07-20: 1000 mL

## 2022-07-20 MED ORDER — SIMVASTATIN 10 MG PO TABS: 20.0000 mg | ORAL_TABLET | Freq: Every day | ORAL | Status: DC

## 2022-07-20 MED ORDER — ACETAMINOPHEN 500 MG PO TABS: 1000.0000 mg | ORAL_TABLET | Freq: Four times a day (QID) | ORAL | Status: DC

## 2022-07-20 MED ORDER — FENTANYL CITRATE (PF) 100 MCG/2ML IJ SOLN
INTRAMUSCULAR | Status: DC | PRN
Start: 2022-07-20 — End: 2022-07-20
  Administered 2022-07-20 (×2): 50 ug via INTRAVENOUS

## 2022-07-20 MED ORDER — SODIUM CHLORIDE 0.9 % IV SOLN
1.0000 g | Freq: Once | INTRAVENOUS | Status: DC
  Administered 2022-07-20: 1 g via INTRAVENOUS
  Filled 2022-07-20: qty 10

## 2022-07-20 MED ORDER — SODIUM CHLORIDE 0.9 % IV SOLN: 2.0000 g | Freq: Once | INTRAVENOUS | Status: DC

## 2022-07-20 MED ORDER — HYDROMORPHONE HCL 1 MG/ML IJ SOLN: 1.0000 mg | INTRAMUSCULAR | Status: DC | PRN

## 2022-07-20 MED ORDER — FUROSEMIDE 10 MG/ML IJ SOLN
40.0000 mg | Freq: Once | INTRAMUSCULAR | Status: AC
Start: 2022-07-20 — End: 2022-07-20
  Administered 2022-07-20: 40 mg via INTRAVENOUS

## 2022-07-20 MED ORDER — METRONIDAZOLE 500 MG/100ML IV SOLN
500.0000 mg | Freq: Two times a day (BID) | INTRAVENOUS | Status: DC
  Administered 2022-07-20: 500 mg via INTRAVENOUS
  Filled 2022-07-20 (×2): qty 100

## 2022-07-20 MED ORDER — ONDANSETRON HCL 4 MG/2ML IJ SOLN: 4.0000 mg | Freq: Four times a day (QID) | INTRAMUSCULAR | Status: DC | PRN

## 2022-07-20 MED ORDER — HYDRALAZINE HCL 20 MG/ML IJ SOLN: 10.0000 mg | INTRAMUSCULAR | Status: DC | PRN

## 2022-07-20 MED ORDER — MIDAZOLAM HCL 2 MG/2ML IJ SOLN
1.0000 mg | Freq: Once | INTRAMUSCULAR | Status: AC
  Administered 2022-07-20: 1 mg via INTRAVENOUS

## 2022-07-20 MED ORDER — DIPHENHYDRAMINE HCL 12.5 MG/5ML PO ELIX: 12.5000 mg | ORAL_SOLUTION | Freq: Four times a day (QID) | ORAL | Status: DC | PRN

## 2022-07-20 MED ORDER — BUPIVACAINE-EPINEPHRINE 0.25% -1:200000 IJ SOLN
INTRAMUSCULAR | Status: DC | PRN
  Administered 2022-07-20: 30 mL

## 2022-07-20 MED ORDER — TRAMADOL HCL 50 MG PO TABS
50.0000 mg | ORAL_TABLET | Freq: Four times a day (QID) | ORAL | Status: DC | PRN
  Filled 2022-07-20: qty 1

## 2022-07-20 MED ORDER — 0.9 % SODIUM CHLORIDE (POUR BTL) OPTIME
TOPICAL | Status: DC | PRN
  Administered 2022-07-20: 1000 mL

## 2022-07-20 MED ORDER — SIMETHICONE 80 MG PO CHEW: 40.0000 mg | CHEWABLE_TABLET | Freq: Four times a day (QID) | ORAL | Status: DC | PRN

## 2022-07-20 MED ORDER — PROPOFOL 10 MG/ML IV BOLUS
INTRAVENOUS | Status: AC
  Filled 2022-07-20: qty 20

## 2022-07-20 MED ORDER — SODIUM CHLORIDE 0.45 % IV SOLN: INTRAVENOUS | Status: DC

## 2022-07-20 MED ORDER — TAMSULOSIN HCL 0.4 MG PO CAPS: 0.4000 mg | ORAL_CAPSULE | Freq: Every day | ORAL | Status: DC

## 2022-07-20 MED ORDER — ENOXAPARIN SODIUM 40 MG/0.4ML IJ SOSY: 40.0000 mg | PREFILLED_SYRINGE | Freq: Every day | INTRAMUSCULAR | Status: DC

## 2022-07-20 MED ORDER — LABETALOL HCL 5 MG/ML IV SOLN
10.0000 mg | INTRAVENOUS | Status: DC | PRN
Start: 2022-07-20 — End: 2022-07-26

## 2022-07-20 MED ORDER — ACETAMINOPHEN 650 MG RE SUPP: 650.0000 mg | Freq: Four times a day (QID) | RECTAL | Status: DC | PRN

## 2022-07-20 MED ORDER — FENTANYL CITRATE (PF) 100 MCG/2ML IJ SOLN
INTRAMUSCULAR | Status: AC
  Filled 2022-07-20: qty 2

## 2022-07-20 MED ORDER — MIDAZOLAM HCL 2 MG/2ML IJ SOLN
INTRAMUSCULAR | Status: AC
  Filled 2022-07-20: qty 2

## 2022-07-20 MED ORDER — SODIUM CHLORIDE 0.9 % IV SOLN
1.0000 g | INTRAVENOUS | Status: DC
  Administered 2022-07-20: 1 g via INTRAVENOUS
  Filled 2022-07-20: qty 10

## 2022-07-20 MED ORDER — MORPHINE SULFATE (PF) 2 MG/ML IV SOLN: 1.0000 mg | INTRAVENOUS | Status: DC | PRN

## 2022-07-20 MED ORDER — ROCURONIUM BROMIDE 10 MG/ML (PF) SYRINGE
PREFILLED_SYRINGE | INTRAVENOUS | Status: DC | PRN
  Administered 2022-07-20: 10 mg via INTRAVENOUS
  Administered 2022-07-20: 40 mg via INTRAVENOUS

## 2022-07-20 MED ORDER — SUCCINYLCHOLINE CHLORIDE 200 MG/10ML IV SOSY
PREFILLED_SYRINGE | INTRAVENOUS | Status: DC | PRN
  Administered 2022-07-20: 200 mg via INTRAVENOUS

## 2022-07-20 MED ORDER — LEVALBUTEROL HCL 0.63 MG/3ML IN NEBU
0.6300 mg | INHALATION_SOLUTION | Freq: Once | RESPIRATORY_TRACT | Status: AC
  Administered 2022-07-20: 0.63 mg via RESPIRATORY_TRACT

## 2022-07-20 SURGICAL SUPPLY — 46 items
APPLIER CLIP ROT 10 11.4 M/L (STAPLE)
BAG COUNTER SPONGE SURGICOUNT (BAG) IMPLANT
CHLORAPREP W/TINT 26 (MISCELLANEOUS) ×1 IMPLANT
CLIP APPLIE ROT 10 11.4 M/L (STAPLE) IMPLANT
CLOSURE STERI STRIP 1/2 X4 (GAUZE/BANDAGES/DRESSINGS) IMPLANT
CNTNR URN SCR LID CUP LEK RST (MISCELLANEOUS) IMPLANT
CONT SPEC 4OZ STRL OR WHT (MISCELLANEOUS) ×1
COVER SURGICAL LIGHT HANDLE (MISCELLANEOUS) ×1 IMPLANT
CUTTER FLEX LINEAR 45M (STAPLE) IMPLANT
DERMABOND ADVANCED (GAUZE/BANDAGES/DRESSINGS) ×1
DERMABOND ADVANCED .7 DNX12 (GAUZE/BANDAGES/DRESSINGS) ×1 IMPLANT
DRAPE LAPAROSCOPIC ABDOMINAL (DRAPES) ×1 IMPLANT
ELECT PENCIL ROCKER SW 15FT (MISCELLANEOUS) IMPLANT
ELECT REM PT RETURN 15FT ADLT (MISCELLANEOUS) ×1 IMPLANT
ENDOLOOP SUT PDS II  0 18 (SUTURE)
ENDOLOOP SUT PDS II 0 18 (SUTURE) IMPLANT
GLOVE SURG ORTHO 8.0 STRL STRW (GLOVE) ×1 IMPLANT
GLOVE SURG SYN 7.5  E (GLOVE) ×1
GLOVE SURG SYN 7.5 E (GLOVE) ×1 IMPLANT
GLOVE SURG SYN 7.5 PF PI (GLOVE) ×1 IMPLANT
GOWN STRL REUS W/ TWL XL LVL3 (GOWN DISPOSABLE) ×2 IMPLANT
GOWN STRL REUS W/TWL XL LVL3 (GOWN DISPOSABLE) ×2
HOLDER FOLEY CATH W/STRAP (MISCELLANEOUS) IMPLANT
IRRIG SUCT STRYKERFLOW 2 WTIP (MISCELLANEOUS) ×1
IRRIGATION SUCT STRKRFLW 2 WTP (MISCELLANEOUS) ×1 IMPLANT
KIT BASIN OR (CUSTOM PROCEDURE TRAY) ×1 IMPLANT
KIT TURNOVER KIT A (KITS) IMPLANT
RELOAD 45 VASCULAR/THIN (ENDOMECHANICALS) IMPLANT
RELOAD STAPLE 45 2.5 WHT GRN (ENDOMECHANICALS) IMPLANT
RELOAD STAPLE 45 3.5 BLU ETS (ENDOMECHANICALS) IMPLANT
RELOAD STAPLE TA45 3.5 REG BLU (ENDOMECHANICALS) ×1 IMPLANT
SET TUBE SMOKE EVAC HIGH FLOW (TUBING) ×1 IMPLANT
SHEARS HARMONIC ACE PLUS 36CM (ENDOMECHANICALS) ×1 IMPLANT
SPIKE FLUID TRANSFER (MISCELLANEOUS) ×1 IMPLANT
STRIP CLOSURE SKIN 1/2X4 (GAUZE/BANDAGES/DRESSINGS) ×1 IMPLANT
SUT MNCRL AB 4-0 PS2 18 (SUTURE) ×1 IMPLANT
SYS BAG RETRIEVAL 10MM (BASKET) ×1
SYSTEM BAG RETRIEVAL 10MM (BASKET) ×1 IMPLANT
TOWEL OR 17X26 10 PK STRL BLUE (TOWEL DISPOSABLE) ×1 IMPLANT
TOWEL OR NON WOVEN STRL DISP B (DISPOSABLE) ×1 IMPLANT
TRAY FOLEY MTR SLVR 14FR STAT (SET/KITS/TRAYS/PACK) IMPLANT
TRAY FOLEY MTR SLVR 16FR STAT (SET/KITS/TRAYS/PACK) IMPLANT
TRAY LAPAROSCOPIC (CUSTOM PROCEDURE TRAY) ×1 IMPLANT
TROCAR 11X100 Z THREAD (TROCAR) ×1 IMPLANT
TROCAR BALLN 12MMX100 BLUNT (TROCAR) ×1 IMPLANT
TROCAR Z-THREAD OPTICAL 5X100M (TROCAR) ×1 IMPLANT

## 2022-07-20 NOTE — ED Notes (Signed)
Patient transported to CT 

## 2022-07-20 NOTE — Anesthesia Procedure Notes (Signed)
Procedure Name: Intubation Date/Time: 07/20/2022 4:17 PM  Performed by: Niel Hummer, CRNAPre-anesthesia Checklist: Patient identified, Emergency Drugs available, Suction available and Patient being monitored Patient Re-evaluated:Patient Re-evaluated prior to induction Oxygen Delivery Method: Circle system utilized Preoxygenation: Pre-oxygenation with 100% oxygen Induction Type: IV induction and Rapid sequence Laryngoscope Size: Mac and 4 Grade View: Grade I Tube type: Oral Tube size: 7.5 mm Number of attempts: 1 Airway Equipment and Method: Stylet Placement Confirmation: ETT inserted through vocal cords under direct vision, positive ETCO2 and breath sounds checked- equal and bilateral Secured at: 23 cm Tube secured with: Tape Dental Injury: Teeth and Oropharynx as per pre-operative assessment

## 2022-07-20 NOTE — Progress Notes (Signed)
Attempted to do nursing admission history. [employer] does not have any [place]  interpreters available at present time. Wife speaks small amount of [place]. Completed information that wife can understand. Darcel Smalling BSN, RN-BC Throughput Nurse 07/20/2022 3:07 PM

## 2022-07-20 NOTE — ED Notes (Signed)
Pt oxygen level 87% informed the nure

## 2022-07-20 NOTE — ED Notes (Signed)
Report to surgery preop nurse. Pt to go to surgery

## 2022-07-20 NOTE — H&P (Signed)
[name] Aug 26, 1935  [id number].    Chief Complaint/Reason for Consult: Acute appendicitis  HPI:  This is an 87 year old male who speaks [place] but is also hard of hearing.  An interpreter is unable to be used as he is unable to hear the iPad.  His wife is at the bedside who speaks limited [place].  His grandson is called on the phone who is able to tell me some of the history but is still limited.  As best as I can tell the patient has been having some weakness since yesterday.  They deny any fever at home.  He has had a cough for several days.  They deny being around any COVID contacts.  He has been having issues with incontinence over the last 24 hours.  The grandson denies that the patient has ever had abdominal pain.  This morning he apparently fell due to his weakness and was brought to [facility] emergency department.  Work-up here reveals some tachycardia in the 1 teens, temperature of 100.4, white blood cell count of 12,000, lactic acid of 3.5, and a CT scan revealing acute appendicitis.  This also revealed mild urinary bladder wall thickening with urothelial enhancement of the urethra concerning for possible urethritis and cystitis but given the dilatation of his urethra a urethral stricture could be a consideration.  We have been asked to evaluate the patient for further intervention for his appendicitis.  ROS: ROS: See HPI, otherwise unable to be obtained due to language barrier.  History reviewed. No pertinent family history.  Past Medical History:  Diagnosis Date   Chronic kidney disease    left nephrolithiasis   Hematuria    ureteral stone and stent   Hyperlipidemia    Hypertension    Right ureteral stone     Past Surgical History:  Procedure Laterality Date   CATARACT EXTRACTION W/ INTRAOCULAR LENS  IMPLANT, BILATERAL     CRANIOTOMY Right 02/12/2015   Procedure: [name] for HEMATOMA EVACUATION SUBDURAL;  Surgeon: Hilda Lias, MD;  Location: [facility];  Service: Neurosurgery;  Laterality: Right;   CYSTOSCOPY W/ URETERAL STENT PLACEMENT  01-02-11   right   CYSTOSCOPY WITH URETEROSCOPY  09/17/2011   Procedure: CYSTOSCOPY WITH URETEROSCOPY;  Surgeon: Milford Cage, MD;  Location: [facility];  Service: Urology;  Laterality: Right;  CYSTOSCOPY, RIGHT URETEROSCOPY WITH LASER LITHO AND STENT    EXTRACORPOREAL SHOCK WAVE LITHOTRIPSY  02-05-11   right   EXTRACORPOREAL SHOCK WAVE LITHOTRIPSY Right 05/12/2020   Procedure: EXTRACORPOREAL SHOCK WAVE LITHOTRIPSY (ESWL);  Surgeon: Alfredo Martinez, MD;  Location: [facility];  Service: Urology;  Laterality: Right;    Social History:  reports that he has never smoked. He has never used smokeless tobacco. He reports that he does not drink alcohol and does not use drugs.  Allergies: No Known Allergies  (Not in a hospital admission)    Physical Exam: Blood pressure 139/89, pulse (!) 106, temperature 99 F (37.2 C), temperature source Oral, resp. rate (!) 26, height  (1.6 m), weight 77.1 kg, SpO2 93 %. General: pleasant, elderly male who is laying in bed in NAD HEENT: head is normocephalic, atraumatic.  Sclera are noninjected.  PERRL.  Ears and nose without any masses or lesions.  Mouth is pink and dry Heart: regular rhythm, but mildly tachycardic in the 110s.  Normal s1,s2. No obvious murmurs, gallops, or rubs noted.  Palpable radial and pedal pulses bilaterally Lungs: CTAB, no  wheezes, rhonchi, or rales noted.  Respiratory effort nonlabored, but he is on 2 L nasal cannula due to some drop in his oxygenation. Abd: soft, tender in right lower quadrant and suprapubic region.  He is tender over McBurney's point., ND, +BS, no masses, hernias, or organomegaly GU: Patient has a male pure wick in place.  He is voiding clear pale urine. MS: all 4 extremities are symmetrical with no cyanosis, clubbing, or edema. Skin: warm and dry with no masses, lesions, or  rashes Psych: Unable due to language barrier   Results for orders placed or performed during the hospital encounter of 07/20/22 (from the past 48 hour(s))  Urinalysis, Routine w reflex microscopic Urine, Clean Catch     Status: Abnormal   Collection Time: 07/20/22  8:54 AM  Result Value Ref Range   Color, Urine YELLOW YELLOW   APPearance CLEAR CLEAR   Specific Gravity, Urine 1.010 1.005 - 1.030   pH 6.0 5.0 - 8.0   Glucose, UA NEGATIVE NEGATIVE mg/dL   Hgb urine dipstick NEGATIVE NEGATIVE   Bilirubin Urine NEGATIVE NEGATIVE   Ketones, ur 20 (A) NEGATIVE mg/dL   Protein, ur 30 (A) NEGATIVE mg/dL   Nitrite NEGATIVE NEGATIVE   Leukocytes,Ua NEGATIVE NEGATIVE   RBC / HPF 0-5 0 - 5 RBC/hpf   WBC, UA 0-5 0 - 5 WBC/hpf   Bacteria, UA NONE SEEN NONE SEEN   Mucus PRESENT    Hyaline Casts, UA PRESENT     Comment: Performed at [facility], [address] [place], [place] [address]  Comprehensive metabolic panel     Status: Abnormal   Collection Time: 07/20/22  9:00 AM  Result Value Ref Range   Sodium 136 135 - 145 mmol/L   Potassium 4.4 3.5 - 5.1 mmol/L   Chloride 101 98 - 111 mmol/L   CO2 25 22 - 32 mmol/L   Glucose, Bld 186 (H) 70 - 99 mg/dL    Comment: Glucose reference range applies only to samples taken after fasting for at least 8 hours.   BUN 10 8 - 23 mg/dL   Creatinine, Ser 8.93 0.61 - 1.24 mg/dL   Calcium 9.3 8.9 - 81.0 mg/dL   Total Protein 7.6 6.5 - 8.1 g/dL   Albumin 4.1 3.5 - 5.0 g/dL   AST 26 15 - 41 U/L   ALT 18 0 - 44 U/L   Alkaline Phosphatase 32 (L) 38 - 126 U/L   Total Bilirubin 1.5 (H) 0.3 - 1.2 mg/dL   GFR, Estimated >17 >51 mL/min    Comment: (NOTE) Calculated using the CKD-EPI Creatinine Equation (2021)    Anion gap 10 5 - 15    Comment: Performed at [facility], [address] [place], [place] [address]  CBC with Differential     Status: Abnormal   Collection Time: 07/20/22  9:00 AM  Result Value Ref Range    WBC 12.8 (H) 4.0 - 10.5 K/uL   RBC 4.80 4.22 - 5.81 MIL/uL   Hemoglobin 15.6 13.0 - 17.0 g/dL   HCT 27.7 82.4 - 23.5 %   MCV 95.0 80.0 - 100.0 fL   MCH 32.5 26.0 - 34.0 pg   MCHC 34.2 30.0 - 36.0 g/dL   RDW 36.1 44.3 - 15.4 %   Platelets 172 150 - 400 K/uL   nRBC 0.0 0.0 - 0.2 %   Neutrophils Relative % 83 %   Neutro Abs 10.6 (H) 1.7 - 7.7 K/uL  Lymphocytes Relative 8 %   Lymphs Abs 1.0 0.7 - 4.0 K/uL   Monocytes Relative 9 %   Monocytes Absolute 1.2 (H) 0.1 - 1.0 K/uL   Eosinophils Relative 0 %   Eosinophils Absolute 0.1 0.0 - 0.5 K/uL   Basophils Relative 0 %   Basophils Absolute 0.0 0.0 - 0.1 K/uL   Immature Granulocytes 0 %   Abs Immature Granulocytes 0.05 0.00 - 0.07 K/uL    Comment: Performed at [facility], [address], [place], [place] [address]  Protime-INR     Status: Abnormal   Collection Time: 07/20/22  9:00 AM  Result Value Ref Range   Prothrombin Time 16.5 (H) 11.4 - 15.2 seconds   INR 1.3 (H) 0.8 - 1.2    Comment: (NOTE) INR goal varies based on device and disease states. Performed at [facility], [address] [place], [place] [address]   APTT     Status: None   Collection Time: 07/20/22  9:00 AM  Result Value Ref Range   aPTT 32 24 - 36 seconds    Comment: Performed at [facility], [address], [place], [place] [address]  Lactic acid, plasma     Status: Abnormal   Collection Time: 07/20/22  9:05 AM  Result Value Ref Range   Lactic Acid, Venous 3.9 (HH) 0.5 - 1.9 mmol/L    Comment: CRITICAL RESULT CALLED TO, READ BACK BY AND VERIFIED WITH BIENFANG, A RN @ [phone] 07/20/22. GILBERT, L Performed at [facility], [address] [place], [place] [address]   Resp Panel by RT-PCR (Flu A&B, Covid) Anterior Nasal Swab     Status: None   Collection Time: 07/20/22  9:21 AM   Specimen: Anterior Nasal Swab  Result Value Ref Range   SARS Coronavirus 2 by RT PCR NEGATIVE NEGATIVE     Comment: (NOTE) SARS-CoV-2 target nucleic acids are NOT DETECTED.  The SARS-CoV-2 RNA is generally detectable in upper respiratory specimens during the acute phase of infection. The lowest concentration of SARS-CoV-2 viral copies this assay can detect is 138 copies/mL. A negative result does not preclude SARS-Cov-2 infection and should not be used as the sole basis for treatment or other patient management decisions. A negative result may occur with  improper specimen collection/handling, submission of specimen other than nasopharyngeal swab, presence of viral mutation(s) within the areas targeted by this assay, and inadequate number of viral copies(<138 copies/mL). A negative result must be combined with clinical observations, patient history, and epidemiological information. The expected result is Negative.  Fact Sheet for Patients:  BloggerCourse.com  Fact Sheet for Healthcare Providers:  SeriousBroker.it  This test is no t yet approved or cleared by the [place] FDA and  has been authorized for detection and/or diagnosis of SARS-CoV-2 by FDA under an Emergency Use Authorization (EUA). This EUA will remain  in effect (meaning this test can be used) for the duration of the COVID-19 declaration under Section 564(b)(1) of the Act, 21 U.S.C.section 360bbb-3(b)(1), unless the authorization is terminated  or revoked sooner.       Influenza A by PCR NEGATIVE NEGATIVE   Influenza B by PCR NEGATIVE NEGATIVE    Comment: (NOTE) The Xpert Xpress SARS-CoV-2/FLU/RSV plus assay is intended as an aid in the diagnosis of influenza from Nasopharyngeal swab specimens and should not be used as a sole basis for treatment. Nasal washings and aspirates are unacceptable for Xpert Xpress SARS-CoV-2/FLU/RSV testing.  Fact Sheet  for Patients: BloggerCourse.com  Fact Sheet for Healthcare  Providers: SeriousBroker.it  This test is not yet approved or cleared by the [place] FDA and has been authorized for detection and/or diagnosis of SARS-CoV-2 by FDA under an Emergency Use Authorization (EUA). This EUA will remain in effect (meaning this test can be used) for the duration of the COVID-19 declaration under Section 564(b)(1) of the Act, 21 U.S.C. section 360bbb-3(b)(1), unless the authorization is terminated or revoked.  Performed at [facility], [address] [place], [place] [address]   CBG monitoring, ED     Status: Abnormal   Collection Time: 07/20/22  9:24 AM  Result Value Ref Range   Glucose-Capillary 174 (H) 70 - 99 mg/dL    Comment: Glucose reference range applies only to samples taken after fasting for at least 8 hours.  Lactic acid, plasma     Status: Abnormal   Collection Time: 07/20/22 11:44 AM  Result Value Ref Range   Lactic Acid, Venous 3.5 (HH) 0.5 - 1.9 mmol/L    Comment: CRITICAL VALUE NOTED. VALUE IS CONSISTENT WITH PREVIOUSLY REPORTED/CALLED VALUE Performed at [facility], [address], [place], [place] [address]    CT Abdomen Pelvis W Contrast  Result Date: 07/20/2022 CLINICAL DATA:  Right lower quadrant pain EXAM: CT ABDOMEN AND PELVIS WITH CONTRAST TECHNIQUE: Multidetector CT imaging of the abdomen and pelvis was performed using the standard protocol following bolus administration of intravenous contrast. RADIATION DOSE REDUCTION: This exam was performed according to the departmental dose-optimization program which includes automated exposure control, adjustment of the mA and/or kV according to patient size and/or use of iterative reconstruction technique. CONTRAST:  OMNIPAQUE IOHEXOL 300 MG/ML  SOLN COMPARISON:  08/26/2020 FINDINGS: Lower chest: Tortuosity of the descending thoracic aorta. Coronary artery atherosclerosis. Hepatobiliary: Mildly decreased attenuation of the  hepatic parenchyma. No focal liver lesion is identified. Unremarkable gallbladder. No hyperdense gallstone. No biliary dilatation. Pancreas: Unremarkable. No pancreatic ductal dilatation or surrounding inflammatory changes. Spleen: Normal in size without focal abnormality. Adrenals/Urinary Tract: Adrenal glands are unremarkable. Kidneys are normal, without renal calculi, solid lesion, or hydronephrosis. Mild urinary bladder wall thickening. The visualized urethra is moderately distended with fluid along its full length with urothelial enhancement (series 6, image 75). Stomach/Bowel: The appendix is abnormally dilated and hyperenhancing with surrounding periappendiceal fat stranding and trace free fluid. No organized periappendiceal fluid collection. No extraluminal air. No dilated loops of bowel to suggest obstruction. Stomach within normal limits. No additional sites of focal bowel inflammation. Vascular/Lymphatic: Scattered aortoiliac atherosclerotic calcifications without aneurysm. No abdominopelvic lymphadenopathy. Reproductive: Prostate is unremarkable. Other: No ascites. No organized abdominopelvic fluid collection. No pneumoperitoneum. No abdominal wall hernia. 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 No new or acute bony findings. IMPRESSION: 1. Acute uncomplicated appendicitis. 2. Mild urinary bladder wall thickening. The visualized urethra is moderately distended with diffuse urothelial enhancement. Findings suspicious for urethritis and cystitis. Correlate with urinalysis. The possibility of a distal urethral stricture is also a consideration. 3. Hepatic steatosis. 4. Aortic and coronary artery atherosclerosis (ICD10-I70.0). Electronically Signed   By: Duanne Guess D.O.   On: 07/20/2022 11:02   CT Head Wo Contrast  Result Date: 07/20/2022 CLINICAL DATA:  Status post fall. EXAM: CT HEAD WITHOUT CONTRAST CT CERVICAL SPINE WITHOUT  CONTRAST TECHNIQUE: Multidetector CT imaging of the head and cervical spine was performed following the standard protocol without intravenous contrast. Multiplanar CT image reconstructions of the  cervical spine were also generated. RADIATION DOSE REDUCTION: This exam was performed according to the departmental dose-optimization program which includes automated exposure control, adjustment of the mA and/or kV according to patient size and/or use of iterative reconstruction technique. COMPARISON:  02/15/2015 head CT. FINDINGS: CT HEAD FINDINGS Brain: There is no evidence for acute hemorrhage, hydrocephalus, mass lesion, or abnormal extra-axial fluid collection. No definite CT evidence for acute infarction. Diffuse loss of parenchymal volume is consistent with atrophy. Patchy low attenuation in the deep hemispheric and periventricular white matter is nonspecific, but likely reflects chronic microvascular ischemic demyelination. Vascular: No hyperdense vessel or unexpected calcification. Skull: No evidence for fracture. No worrisome lytic or sclerotic lesion. Right-sided burr holes evident. Sinuses/Orbits: The visualized paranasal sinuses and mastoid air cells are clear. Visualized portions of the globes and intraorbital fat are unremarkable. Other: None. CT CERVICAL SPINE FINDINGS Alignment: Trace anterolisthesis of C4 on 5 and C5 on 6 is compatible with a degree of facet disease at those levels. No evidence for traumatic subluxation. Skull base and vertebrae: No acute fracture. No primary bone lesion or focal pathologic process. Soft tissues and spinal canal: No prevertebral fluid or swelling. No visible canal hematoma. Disc levels: Mild loss of disc height noted C6-7 and C7-T1. Diffuse facet osteoarthritis noted bilaterally. Upper chest: Centrilobular emphysema. Other: None. IMPRESSION: 1. No acute intracranial abnormality. 2. Atrophy with chronic small vessel white matter ischemic disease. 3. No evidence for  cervical spine fracture or traumatic subluxation. 4. Degenerative changes in the cervical spine as above. Electronically Signed   By: Kennith CenterEric  Mansell M.D.   On: 07/20/2022 10:52   CT Cervical Spine Wo Contrast  Result Date: 07/20/2022 CLINICAL DATA:  Status post fall. EXAM: CT HEAD WITHOUT CONTRAST CT CERVICAL SPINE WITHOUT CONTRAST TECHNIQUE: Multidetector CT imaging of the head and cervical spine was performed following the standard protocol without intravenous contrast. Multiplanar CT image reconstructions of the cervical spine were also generated. RADIATION DOSE REDUCTION: This exam was performed according to the departmental dose-optimization program which includes automated exposure control, adjustment of the mA and/or kV according to patient size and/or use of iterative reconstruction technique. COMPARISON:  02/15/2015 head CT. FINDINGS: CT HEAD FINDINGS Brain: There is no evidence for acute hemorrhage, hydrocephalus, mass lesion, or abnormal extra-axial fluid collection. No definite CT evidence for acute infarction. Diffuse loss of parenchymal volume is consistent with atrophy. Patchy low attenuation in the deep hemispheric and periventricular white matter is nonspecific, but likely reflects chronic microvascular ischemic demyelination. Vascular: No hyperdense vessel or unexpected calcification. Skull: No evidence for fracture. No worrisome lytic or sclerotic lesion. Right-sided burr holes evident. Sinuses/Orbits: The visualized paranasal sinuses and mastoid air cells are clear. Visualized portions of the globes and intraorbital fat are unremarkable. Other: None. CT CERVICAL SPINE FINDINGS Alignment: Trace anterolisthesis of C4 on 5 and C5 on 6 is compatible with a degree of facet disease at those levels. No evidence for traumatic subluxation. Skull base and vertebrae: No acute fracture. No primary bone lesion or focal pathologic process. Soft tissues and spinal canal: No prevertebral fluid or swelling. No  visible canal hematoma. Disc levels: Mild loss of disc height noted C6-7 and C7-T1. Diffuse facet osteoarthritis noted bilaterally. Upper chest: Centrilobular emphysema. Other: None. IMPRESSION: 1. No acute intracranial abnormality. 2. Atrophy with chronic small vessel white matter ischemic disease. 3. No evidence for cervical spine fracture or traumatic subluxation. 4. Degenerative changes in the cervical spine as above. Electronically Signed   By: Kennith CenterEric  Mansell  M.D.   On: 07/20/2022 10:52   DG Chest 2 View  Result Date: 07/20/2022 CLINICAL DATA:  Suspected sepsis EXAM: CHEST - 2 VIEW COMPARISON:  Chest x-ray dated December 24, 2010 FINDINGS: Cardiac and mediastinal contours are within normal limits for technique. Low lung volumes with hypoventilatory changes. No definite focal consolidation. No large pleural effusion or pneumothorax. IMPRESSION: Low lung volumes with hypoventilatory changes. No definite focal consolidation. Electronically Signed   By: Allegra Lai M.D.   On: 07/20/2022 09:45      Assessment/Plan Acute appendicitis The patient has been seen, examined, chart, labs, vitals, and imaging have all personally been reviewed.  The patient does appear to have findings on CT scan and physical exam consistent with acute uncomplicated appendicitis.  It is somewhat odd to find that he has a persistently elevated lactic acid of 3.5 from 3.9 along with tachycardia and low-grade fever given the uncomplicated nature of his appendix; however, it is certainly possible.  He has been resuscitated with several liter boluses of fluid in the emergency department.  Given the patient has pain in his right lower quadrant as well as radiographic finding of appendicitis, the recommendation has been made to proceed to the operating room for laparoscopic appendectomy.  The procedure was discussed with the patient's grandson who speaks some English on the phone.  He did translate the discussion with the patient's  wife who understood the procedure as well as risks and complications and agreed to proceed.  I have discussed the procedure and risks of appendectomy. The risks include but are not limited to bleeding, infection, wound problems, anesthesia, injury to intra-abdominal organs, possibility of postoperative ileus.  The wife seems to understand and agrees with the plan.  FEN -NPO/IVFs VTE -SCDs, chemical prophylaxis to start tomorrow ID -Rocephin/Flagyl Admit -observation  HTN -resume home medication, as needed medication ordered Hypothyroidism -Synthroid ordered Lactic acidosis -likely secondary to above.  Continue fluid resuscitation and surgical intervention which should assist in managing infectious process.  Blood cultures are pending per the ER. Incontinence/dilated urethra -I have discussed the radiologic findings and clinical symptoms with urology, Dr. Sande Brothers, who has recommended initiating tamsulosin daily and outpatient follow-up.  If we do place a Foley catheter in the operating room and determined that the patient has a stricture, he has recommended that we give him a call to assess this intraoperatively.  I reviewed ED provider notes, last 24 h vitals and pain scores, last 48 h intake and output, last 24 h labs and trends, and last 24 h imaging results.  Letha Cape, [facility] Surgery 07/20/2022, 2:39 PM Please see Amion for pager number during day hours 7:00am-4:30pm or 7:00am -11:30am on weekends

## 2022-07-20 NOTE — Anesthesia Postprocedure Evaluation (Signed)
Anesthesia Post Note  Patient: Passenger transport manager  Procedure(s) Performed: APPENDECTOMY LAPAROSCOPIC     Patient location during evaluation: PACU Anesthesia Type: General Level of consciousness: awake and alert Pain management: pain level controlled Vital Signs Assessment: post-procedure vital signs reviewed and stable Respiratory status: spontaneous breathing, nonlabored ventilation, respiratory function stable and patient connected to face mask oxygen Cardiovascular status: blood pressure returned to baseline Postop Assessment: no apparent nausea or vomiting Anesthetic complications: yes Comments: Patient emerged hypertensive and tachycardic as he was when he presented to the ED. He was wheezing loudly when I saw him in PACU. Respirations were shallow, I obtained a cxr which showed volume overload and atelectasis. He was given a xopinex nebulizer and 40mg  lasix iv. His wheezing improved and he put out 800cc of urine. He remained tachycardic, 50mg  esmolol given x 1 which decreased his pulse from 130's to 110-116. Surgery was contacted and they contacted CCM for further management of patient. Taken to ICU   No notable events documented.  Last Vitals:  Vitals:   07/20/22 1830 07/20/22 1845  BP: (!) 157/137   Pulse: (!) 132 (!) 105  Resp: (!) 24 15  Temp:    SpO2: 94% 93%    Last Pain:  Vitals:   07/20/22 1552  TempSrc:   PainSc: 0-No pain                 Travelle Mcclimans S

## 2022-07-20 NOTE — ED Notes (Signed)
Pt assisted onto bedside commode w/ 2 staff person assist.  Pt weak and unsteady.  Pt had large bowel movement.  Pt assisted back to bed w/ 2 person staff assist.

## 2022-07-20 NOTE — Op Note (Signed)
OPERATIVE REPORT - LAPAROSCOPIC APPENDECTOMY  Preop diagnosis:  Acute appendicitis  Postop diagnosis:  Acute gangrenous perforated appendicitis  Procedure:  Laparoscopic appendectomy  Surgeon:  Darnell Level, MD  Anesthesia:  general endotracheal  Estimated blood loss:  minimal  Preparation:  Chlora-prep  Complications:  none  Indications: Patient is an 87 year old male who presented to the emergency department with signs and symptoms of acute appendicitis.  CT scan demonstrated findings consistent with acute appendicitis.  Patient was brought to the operating room for appendectomy.  Procedure:  Patient was brought to the operating room and placed in a supine position on the operating room table. Following administration of general anesthesia, a time out was held and the patient's name and procedure was confirmed. Patient was then prepped and draped in the usual strict aseptic fashion.  After ascertaining that an adequate level of anesthesia had been achieved, a peri-umbilical incision was made with a #15 blade. Dissection was carried down to the fascia. Fascia was incised in the midline and the peritoneal cavity was entered cautiously. A #0-vicryl pursestring suture was placed in the fascia. An Hassan cannula was introduced under direct vision and secured with the pursestring suture. The abdomen was insufflated with carbon dioxide. The laparoscope was introduced and the abdomen was explored. Operative ports were placed in the right upper quadrant and left lower quadrant.  In the right lower quadrant there are several loops of small bowel adherent to an inflammatory process just below the cecum.  These are gently mobilized.  The appendix is exposed.  It is adherent to the retroperitoneum.  It is grossly necrotic and there appears to be a small area of perforation approximately 2 cm from the base.  The appendix is gently mobilized and the mesoappendix divided with the harmonic scalpel.   Dissection is carried down to the base of the appendix which is also elevated and gently mobilized back to the cecal wall.  An Endo GIA stapler is used to transect the base of the appendix.  Staple line is inspected for hemostasis and appears to also be intact.  The appendix is placed into an Endo Catch bag and withdrawn through the umbilical port.  The #0-vicryl pursestring suture was tied securely.  Right lower quadrant was irrigated with warm saline which was evacuated. Good hemostasis was noted. Ports were removed under direct vision. Good hemostasis was noted at the port sites. Pneumoperitoneum was released.  Skin incisions were anesthetized with local anesthetic. Wounds were closed with interrupted 4-0 Monocryl subcuticular sutures. Wounds were washed and dried and Dermabond was applied. The patient was awakened from anesthesia and brought to the recovery room. The patient tolerated the procedure well.  Darnell Level, MD [facility] Surgery Office: [phone]

## 2022-07-20 NOTE — Interval H&P Note (Signed)
History and Physical Interval Note:  07/20/2022 3:31 PM  [name]  has presented today for surgery, with the diagnosis of APPENDICITIS.  The various methods of treatment have been discussed with the patient and family. After consideration of risks, benefits and other options for treatment, the patient has consented to    Procedure(s): APPENDECTOMY LAPAROSCOPIC (N/A) as a surgical intervention.    The patient's history has been reviewed, patient examined, no change in status, stable for surgery.  I have reviewed the patient's chart and labs.  Questions were answered to the patient's satisfaction.    Darnell Level, MD [facility] A [facility] practice Office: [phone]   Darnell Level

## 2022-07-20 NOTE — Anesthesia Preprocedure Evaluation (Addendum)
Anesthesia Evaluation  Patient identified by MRN, date of birth, ID band Patient awake    Reviewed: Allergy & Precautions, NPO status , Patient's Chart, lab work & pertinent test results  Airway Mallampati: III  TM Distance: >3 FB Neck ROM: Full    Dental  (+) Edentulous Upper, Edentulous Lower, Dental Advisory Given   Pulmonary neg pulmonary ROS,    Pulmonary exam normal breath sounds clear to auscultation       Cardiovascular hypertension, Pt. on medications Normal cardiovascular exam Rhythm:Regular Rate:Normal     Neuro/Psych negative neurological ROS  negative psych ROS   GI/Hepatic negative GI ROS, Neg liver ROS,   Endo/Other  Hypothyroidism   Renal/GU negative Renal ROS  negative genitourinary   Musculoskeletal negative musculoskeletal ROS (+)   Abdominal   Peds  Hematology negative hematology ROS (+)   Anesthesia Other Findings   Reproductive/Obstetrics                            Anesthesia Physical Anesthesia Plan  ASA: 2  Anesthesia Plan: General   Post-op Pain Management: Tylenol PO (pre-op)*   Induction: Intravenous  PONV Risk Score and Plan: 2 and Dexamethasone, Ondansetron and Treatment may vary due to age or medical condition  Airway Management Planned: Oral ETT  Additional Equipment:   Intra-op Plan:   Post-operative Plan: Extubation in OR  Informed Consent: I have reviewed the patients History and Physical, chart, labs and discussed the procedure including the risks, benefits and alternatives for the proposed anesthesia with the patient or authorized representative who has indicated his/her understanding and acceptance.     Dental advisory given  Plan Discussed with: CRNA  Anesthesia Plan Comments:        Anesthesia Quick Evaluation

## 2022-07-20 NOTE — Consult Note (Signed)
Initial Consultation Note   Patient: [name] DOB: [birth year] PCP: Nicholas James, MD DOA: 07/20/2022 DOS: the patient was seen and examined on 07/20/2022 Primary service: Nicholas Morita, Md, MD  Referring physician: CCS  Reason for consult: Delirium, respiratory distress    Assessment and Plan:  1. Acute hypoxic respiratory failure  - Mild hypoxia in ED with clear CXR, then requiring 10 Lpm supplemental O2 after IV fluid resuscitation  - CXR concerning for pulmonary edema and he was given 40 mg IV Lasix  - Stop IVF, follow strict I/Os, check echocardiogram, continue supplemental O2 as needed   2. Acute encephalopathy  - Patient had increased fatigue and was less verbal for ~1 wk before become delirious lethargic last night  - No acute findings on head CT, no electrolyte abnormalities, and no meningismus  - Likely from acute infection/sepsis  - Continue antibiotics and supportive care, use delirium precautions, low-dose Haldol if needed, check ammonia, TSH, RPR, and B12   3. Sepsis secondary to acute appendicitis s/p appendectomy  - Febrile and tachycardic on presentation with WBC 12,800 and lactate 3.9  - Cultured, started on cefepime and Flagyl, and taken for laparoscopic appendectomy the evening of 07/20/22  - Continue antibiotics, follow cultures, trend lactate   4. Hypertension  - Continue Norvasc and losartan    5. Hypothyroidism  - Continue Synthroid    6. Hyperlipidemia  - Continue Zocor    TR[facility] will continue to follow the patient.  HPI: [name] is a pleasant 87 y.o. male with past medical history of hypertension, hypothyroidism, hyperlipidemia, nephrolithiasis, and SDH s/p evacuation in 2016, now presenting to the emergency department with lethargy, confusion, and fall.  [name] had not been speaking as much and seemed more fatigued for approximately a week per report of his wife, and then worsened acutely last night.  He had a fall overnight while  trying to use the bathroom, was confused and unable to get up on his own. His wife was unable to help him up from the floor and called EMS.  He was found to be saturating in the 80s on room air, was placed on nonrebreather, and brought into the ED.  In the emergency department, the patient was found to be febrile, saturating in the 80s on room air, and tachycardic in the 130s with stable blood pressure.  He had a leukocytosis to 12,800 and lactic acid of 3.9.  CT of the abdomen and pelvis was concerning for acute uncomplicated appendicitis and also notable for urinary bladder wall thickening and distended urethra with urothelial enhancement.  Patient was admitted by surgery and taken to the OR this evening for laparoscopic appendectomy where he was found to have acute gangrenous perforated appendicitis.  He emerged from anesthesia hypertensive, tachycardic, wheezing, and hypoxic.  He had a chest x-ray concerning for developing pulmonary edema, was given 40 mg IV Lasix, Xopenex, and transferred to the ICU.    Review of Systems: unable to review all systems due to the inability of the patient to answer questions. Past Medical History:  Diagnosis Date   Chronic kidney disease    left nephrolithiasis   Hematuria    ureteral stone and stent   Hyperlipidemia    Hypertension    Right ureteral stone    Past Surgical History:  Procedure Laterality Date   CATARACT EXTRACTION W/ INTRAOCULAR LENS  IMPLANT, BILATERAL     CRANIOTOMY Right 02/12/2015   Procedure: [name] for HEMATOMA EVACUATION SUBDURAL;  Surgeon: [name]  Nicholas Fruit, MD;  Location: [facility];  Service: Neurosurgery;  Laterality: Right;   CYSTOSCOPY W/ URETERAL STENT PLACEMENT  01-02-11   right   CYSTOSCOPY WITH URETEROSCOPY  09/17/2011   Procedure: CYSTOSCOPY WITH URETEROSCOPY;  Surgeon: Nicholas Cage, MD;  Location[facility];  Service: Urology;  Laterality: Right;  CYSTOSCOPY, RIGHT URETEROSCOPY WITH LASER LITHO  AND STENT    EXTRACORPOREAL SHOCK WAVE LITHOTRIPSY  02-05-11   right   EXTRACORPOREAL SHOCK WAVE LITHOTRIPSY Right 05/12/2020   Procedure: EXTRACORPOREAL SHOCK WAVE LITHOTRIPSY (ESWL);  Surgeon: Nicholas Martinez, MD;  Location: [facility];  Service: Urology;  Laterality: Right;   Social History:  reports that he has never smoked. He has never used smokeless tobacco. He reports that he does not drink alcohol and does not use drugs.  No Known Allergies  History reviewed. No pertinent family history.  Prior to Admission medications   Medication Sig Start Date End Date Taking? Authorizing Provider  aspirin EC 81 MG tablet Take 81 mg by mouth in the morning. Swallow whole.   Yes [provider]  Calcium Carb-Cholecalciferol (CALCIUM + D3 PO) Take 1 tablet by mouth daily.   Yes [provider]  levothyroxine (SYNTHROID) 50 MCG tablet Take 50 mcg by mouth daily before breakfast.   Yes [provider]  losartan (COZAAR) 25 MG tablet Take 25 mg by mouth daily.   Yes [provider]  Multiple Vitamins-Minerals (CENTRUM SILVER 50+MEN) TABS Take 1 tablet by mouth daily.   Yes [provider]  Omega-3 Fatty Acids (FISH OIL PO) Take 1 capsule by mouth daily.   Yes [provider]  simvastatin (ZOCOR) 20 MG tablet Take 20 mg by mouth every evening.   Yes [provider]  amLODipine (NORVASC) 5 MG tablet Take 5 mg by mouth every morning.   Patient not taking: Reported on 07/20/2022    [provider]  HYDROcodone-acetaminophen (NORCO/VICODIN) 5-325 MG per tablet Take 1 tablet by mouth every 6 (six) hours as needed. Patient not taking: Reported on 07/20/2022 06/04/13   Nicholas Art, DO  levothyroxine (SYNTHROID, LEVOTHROID) 25 MCG tablet Take 1 tablet (25 mcg total) by mouth daily before breakfast. Patient not taking: Reported on 07/20/2022 06/04/13   Nicholas Art, DO    Physical Exam: Vitals:   07/20/22 1815 07/20/22  1830 07/20/22 1845 07/20/22 1915  BP: (!) 181/105 (!) 157/137  (!) 175/105  Pulse: (!) 132 (!) 132 (!) 105   Resp: 17 (!) 24 15 (!) 25  Temp:      TempSrc:      SpO2: 97% 94% 93%   Weight:      Height:        Constitutional: no diaphoresis. No pallor   Eyes: PERTLA, lids and conjunctivae normal ENMT: Mucous membranes are moist. Posterior pharynx clear of any exudate or lesions.   Neck: supple, no masses  Respiratory: Tachypneic with accessory muscle recruitment, fine rales b/l. No wheezing.   Cardiovascular: Rate ~120 and regular. No extremity edema.  Abdomen:  soft. Non-distended.  Musculoskeletal: no clubbing / cyanosis. No joint deformity upper and lower extremities.   Skin: no significant rashes, lesions, ulcers. Warm, dry, well-perfused. Neurologic: CN 2-12 grossly intact. Sensation to light touch intact. Moving all extremities. Alert and oriented to person only.  Psychiatric: Restless. Cooperative.   Data Reviewed:   EKG: Sinus tachycardia, LPFB. CT: acute appendicitis, urinary bladder wall thickening and distended urethra.  Family Communication: wife at bedside  Primary team communication: discussed with Dr. Donell Beers  Thank you very much for involving [place] in the care of your patient. We will continue to follow.   Author: Briscoe Deutscher, MD 07/20/2022 8:00 PM  For on call review [email or web address].

## 2022-07-20 NOTE — ED Provider Notes (Signed)
[facility]-EMERGENCY DEPT Provider Note   CSN: [id number] Arrival date & time: 07/20/22  6063     History  Chief Complaint  Patient presents with   Altered Mental Status   Urinary Frequency    [name] is a 87 y.o. male.  Patient is an 87 year old male with a past medical history of hypertension, CKD and nephrolithiasis presenting to the emergency department with altered mental status.  Per EMS, the patient came from home and family called 911 due to the patient being altered today.  They also report he had foul-smelling urine.  When medics arrived, the patient was hypoxic to the 80s on room air and they placed him on nonrebreather with improvement.  Patient here is oriented to person and place and reports having right lower quadrant/right flank pain.  History is further limited due to his altered mental status and language barrier with difficulty from the patient being able to hear the interpreter as he is hard of hearing.  The history is provided by the patient and the EMS personnel. The history is limited by the condition of the patient. A language interpreter was used [place] [phone]).  Altered Mental Status Urinary Frequency       Home Medications Prior to Admission medications   Medication Sig Start Date End Date Taking? Authorizing Provider  amLODipine (NORVASC) 5 MG tablet Take 5 mg by mouth every morning.      [provider]  HYDROcodone-acetaminophen (NORCO/VICODIN) 5-325 MG per tablet Take 1 tablet by mouth every 6 (six) hours as needed. 06/04/13   Joseph Art, DO  levothyroxine (SYNTHROID, LEVOTHROID) 25 MCG tablet Take 1 tablet (25 mcg total) by mouth daily before breakfast. 06/04/13   Joseph Art, DO  simvastatin (ZOCOR) 20 MG tablet Take 20 mg by mouth at bedtime.      [provider]      Allergies    Patient has no known allergies.    Review of Systems   Review of Systems  Genitourinary:  Positive for  frequency.    Physical Exam Updated Vital Signs BP (!) 146/100   Pulse (!) 117   Temp 99 F (37.2 C) (Oral)   Resp (!) 29   Ht 5\' 3"  (1.6 m)   Wt 77.1 kg   SpO2 95%   BMI 30.11 kg/m  Physical Exam Vitals and nursing note reviewed.  Constitutional:      Appearance: Normal appearance. He is ill-appearing (mildly).  HENT:     Head: Normocephalic and atraumatic.     Nose: Nose normal.     Mouth/Throat:     Mouth: Mucous membranes are moist.     Pharynx: Oropharynx is clear.  Eyes:     Extraocular Movements: Extraocular movements intact.     Conjunctiva/sclera: Conjunctivae normal.  Cardiovascular:     Rate and Rhythm: Regular rhythm. Tachycardia present.     Heart sounds: Normal heart sounds.  Pulmonary:     Breath sounds: Normal breath sounds.     Comments: Tachypneic, but no extra-respiratory muscle use Abdominal:     General: Abdomen is flat.     Palpations: Abdomen is soft.     Tenderness: There is abdominal tenderness (RLQ, R flank. No rebound or guarding).  Musculoskeletal:        General: Normal range of motion.     Cervical back: Normal range of motion and neck supple.     Right lower leg: No edema.     Left  lower leg: No edema.  Skin:    General: Skin is warm and dry.  Neurological:     General: No focal deficit present.     Mental Status: He is alert.     Comments: Oriented to person and place  Psychiatric:        Mood and Affect: Mood normal.        Behavior: Behavior normal.     ED Results / Procedures / Treatments   Labs (all labs ordered are listed, but only abnormal results are displayed) Labs Reviewed  COMPREHENSIVE METABOLIC PANEL - Abnormal; Notable for the following components:      Result Value   Glucose, Bld 186 (*)    Alkaline Phosphatase 32 (*)    Total Bilirubin 1.5 (*)    All other components within normal limits  LACTIC ACID, PLASMA - Abnormal; Notable for the following components:   Lactic Acid, Venous 3.9 (*)    All other  components within normal limits  LACTIC ACID, PLASMA - Abnormal; Notable for the following components:   Lactic Acid, Venous 3.5 (*)    All other components within normal limits  CBC WITH DIFFERENTIAL/PLATELET - Abnormal; Notable for the following components:   WBC 12.8 (*)    Neutro Abs 10.6 (*)    Monocytes Absolute 1.2 (*)    All other components within normal limits  PROTIME-INR - Abnormal; Notable for the following components:   Prothrombin Time 16.5 (*)    INR 1.3 (*)    All other components within normal limits  URINALYSIS, ROUTINE W REFLEX MICROSCOPIC - Abnormal; Notable for the following components:   Ketones, ur 20 (*)    Protein, ur 30 (*)    All other components within normal limits  CBG MONITORING, ED - Abnormal; Notable for the following components:   Glucose-Capillary 174 (*)    All other components within normal limits  RESP PANEL BY RT-PCR (FLU A&B, COVID) [id number]  CULTURE, BLOOD (ROUTINE X 2)  CULTURE, BLOOD (ROUTINE X 2)  URINE CULTURE  APTT    EKG EKG Interpretation  Date/Time:  Friday July 20 2022 08:44:03 EDT Ventricular Rate:  121 PR Interval:  179 QRS Duration: 90 QT Interval:  302 QTC Calculation: 429 R Axis:   130 Text Interpretation: Sinus tachycardia Left posterior fascicular block Low voltage, precordial leads Abnormal R-wave progression, late transition No significant change since last tracing Confirmed by Arturo Morton ([id number]) on 07/20/2022 10:20:08 AM  Radiology CT Abdomen Pelvis W Contrast  Result Date: 07/20/2022 CLINICAL DATA:  Right lower quadrant pain EXAM: CT ABDOMEN AND PELVIS WITH CONTRAST TECHNIQUE: Multidetector CT imaging of the abdomen and pelvis was performed using the standard protocol following bolus administration of intravenous contrast. RADIATION DOSE REDUCTION: This exam was performed according to the departmental dose-optimization program which includes automated exposure control, adjustment of the mA and/or kV  according to patient size and/or use of iterative reconstruction technique. CONTRAST:  OMNIPAQUE IOHEXOL 300 MG/ML  SOLN COMPARISON:  08/26/2020 FINDINGS: Lower chest: Tortuosity of the descending thoracic aorta. Coronary artery atherosclerosis. Hepatobiliary: Mildly decreased attenuation of the hepatic parenchyma. No focal liver lesion is identified. Unremarkable gallbladder. No hyperdense gallstone. No biliary dilatation. Pancreas: Unremarkable. No pancreatic ductal dilatation or surrounding inflammatory changes. Spleen: Normal in size without focal abnormality. Adrenals/Urinary Tract: Adrenal glands are unremarkable. Kidneys are normal, without renal calculi, solid lesion, or hydronephrosis. Mild urinary bladder wall thickening. The visualized urethra is moderately distended with fluid along its full length with  urothelial enhancement (series 6, image 75). Stomach/Bowel: The appendix is abnormally dilated and hyperenhancing with surrounding periappendiceal fat stranding and trace free fluid. No organized periappendiceal fluid collection. No extraluminal air. No dilated loops of bowel to suggest obstruction. Stomach within normal limits. No additional sites of focal bowel inflammation. Vascular/Lymphatic: Scattered aortoiliac atherosclerotic calcifications without aneurysm. No abdominopelvic lymphadenopathy. Reproductive: Prostate is unremarkable. Other: No ascites. No organized abdominopelvic fluid collection. No pneumoperitoneum. No abdominal wall hernia. 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 No new or acute bony findings. IMPRESSION: 1. Acute uncomplicated appendicitis. 2. Mild urinary bladder wall thickening. The visualized urethra is moderately distended with diffuse urothelial enhancement. Findings suspicious for urethritis and cystitis. Correlate with urinalysis. The possibility of a distal urethral stricture is also a  consideration. 3. Hepatic steatosis. 4. Aortic and coronary artery atherosclerosis (ICD10-I70.0). Electronically Signed   By: Duanne Guess D.O.   On: 07/20/2022 11:02   CT Head Wo Contrast  Result Date: 07/20/2022 CLINICAL DATA:  Status post fall. EXAM: CT HEAD WITHOUT CONTRAST CT CERVICAL SPINE WITHOUT CONTRAST TECHNIQUE: Multidetector CT imaging of the head and cervical spine was performed following the standard protocol without intravenous contrast. Multiplanar CT image reconstructions of the cervical spine were also generated. RADIATION DOSE REDUCTION: This exam was performed according to the departmental dose-optimization program which includes automated exposure control, adjustment of the mA and/or kV according to patient size and/or use of iterative reconstruction technique. COMPARISON:  02/15/2015 head CT. FINDINGS: CT HEAD FINDINGS Brain: There is no evidence for acute hemorrhage, hydrocephalus, mass lesion, or abnormal extra-axial fluid collection. No definite CT evidence for acute infarction. Diffuse loss of parenchymal volume is consistent with atrophy. Patchy low attenuation in the deep hemispheric and periventricular white matter is nonspecific, but likely reflects chronic microvascular ischemic demyelination. Vascular: No hyperdense vessel or unexpected calcification. Skull: No evidence for fracture. No worrisome lytic or sclerotic lesion. Right-sided burr holes evident. Sinuses/Orbits: The visualized paranasal sinuses and mastoid air cells are clear. Visualized portions of the globes and intraorbital fat are unremarkable. Other: None. CT CERVICAL SPINE FINDINGS Alignment: Trace anterolisthesis of C4 on 5 and C5 on 6 is compatible with a degree of facet disease at those levels. No evidence for traumatic subluxation. Skull base and vertebrae: No acute fracture. No primary bone lesion or focal pathologic process. Soft tissues and spinal canal: No prevertebral fluid or swelling. No visible canal  hematoma. Disc levels: Mild loss of disc height noted C6-7 and C7-T1. Diffuse facet osteoarthritis noted bilaterally. Upper chest: Centrilobular emphysema. Other: None. IMPRESSION: 1. No acute intracranial abnormality. 2. Atrophy with chronic small vessel white matter ischemic disease. 3. No evidence for cervical spine fracture or traumatic subluxation. 4. Degenerative changes in the cervical spine as above. Electronically Signed   By: Kennith Center M.D.   On: 07/20/2022 10:52   CT Cervical Spine Wo Contrast  Result Date: 07/20/2022 CLINICAL DATA:  Status post fall. EXAM: CT HEAD WITHOUT CONTRAST CT CERVICAL SPINE WITHOUT CONTRAST TECHNIQUE: Multidetector CT imaging of the head and cervical spine was performed following the standard protocol without intravenous contrast. Multiplanar CT image reconstructions of the cervical spine were also generated. RADIATION DOSE REDUCTION: This exam was performed according to the departmental dose-optimization program which includes automated exposure control, adjustment of the mA and/or kV according to patient size and/or use of iterative reconstruction technique. COMPARISON:  02/15/2015 head CT. FINDINGS: CT HEAD FINDINGS Brain: There is no evidence for acute  hemorrhage, hydrocephalus, mass lesion, or abnormal extra-axial fluid collection. No definite CT evidence for acute infarction. Diffuse loss of parenchymal volume is consistent with atrophy. Patchy low attenuation in the deep hemispheric and periventricular white matter is nonspecific, but likely reflects chronic microvascular ischemic demyelination. Vascular: No hyperdense vessel or unexpected calcification. Skull: No evidence for fracture. No worrisome lytic or sclerotic lesion. Right-sided burr holes evident. Sinuses/Orbits: The visualized paranasal sinuses and mastoid air cells are clear. Visualized portions of the globes and intraorbital fat are unremarkable. Other: None. CT CERVICAL SPINE FINDINGS Alignment: Trace  anterolisthesis of C4 on 5 and C5 on 6 is compatible with a degree of facet disease at those levels. No evidence for traumatic subluxation. Skull base and vertebrae: No acute fracture. No primary bone lesion or focal pathologic process. Soft tissues and spinal canal: No prevertebral fluid or swelling. No visible canal hematoma. Disc levels: Mild loss of disc height noted C6-7 and C7-T1. Diffuse facet osteoarthritis noted bilaterally. Upper chest: Centrilobular emphysema. Other: None. IMPRESSION: 1. No acute intracranial abnormality. 2. Atrophy with chronic small vessel white matter ischemic disease. 3. No evidence for cervical spine fracture or traumatic subluxation. 4. Degenerative changes in the cervical spine as above. Electronically Signed   By: Kennith CenterEric  Mansell M.D.   On: 07/20/2022 10:52   DG Chest 2 View  Result Date: 07/20/2022 CLINICAL DATA:  Suspected sepsis EXAM: CHEST - 2 VIEW COMPARISON:  Chest x-ray dated December 24, 2010 FINDINGS: Cardiac and mediastinal contours are within normal limits for technique. Low lung volumes with hypoventilatory changes. No definite focal consolidation. No large pleural effusion or pneumothorax. IMPRESSION: Low lung volumes with hypoventilatory changes. No definite focal consolidation. Electronically Signed   By: Allegra LaiLeah  Strickland M.D.   On: 07/20/2022 09:45    Procedures Procedures    Medications Ordered in ED Medications  lactated ringers infusion ( Intravenous New Bag/Given 07/20/22 1255)  cefTRIAXone (ROCEPHIN) 1 g in sodium chloride 0.9 % 100 mL IVPB (0 g Intravenous Stopped 07/20/22 1206)  lactated ringers bolus 1,000 mL (0 mLs Intravenous Stopped 07/20/22 1130)    And  lactated ringers bolus 1,000 mL (0 mLs Intravenous Stopped 07/20/22 1008)    And  lactated ringers bolus 500 mL (0 mLs Intravenous Stopped 07/20/22 1245)  acetaminophen (TYLENOL) tablet 650 mg (650 mg Oral Given 07/20/22 0938)  iohexol (OMNIPAQUE) 300 MG/ML solution 100 mL (100 mLs Intravenous  Contrast Given 07/20/22 1027)  sodium chloride (PF) 0.9 % injection (  Given 07/20/22 1126)  metroNIDAZOLE (FLAGYL) IVPB 500 mg (0 mg Intravenous Stopped 07/20/22 1243)    ED Course/ Medical Decision Making/ A&P Clinical Course as of 07/20/22 1408  Fri Jul 20, 2022  1019 Patient's grandson is at bedside and reports that the patient has been having urinary incontinence for quite some time.  He states that this morning he did have a fall and was too weak to get himself up and was also complaining of difficulty breathing which prompted them to bring him to the emergency department. [VK]  1158 Patient CT scan shows evidence of acute uncomplicated appendicitis which is likely the cause of his sepsis.  I spoke with general surgery who agreed with Rocephin, Flagyl and fluids. They are currently in the OR and will evaluate the patient at bedside when available. [VK]  1355 Surgery is evaluating the patient at bedside with plan for likely OR. Patient's HR is improving with fluids and antibiotics and he was started on maintenance fluids [VK]    Clinical  Course User Index [VK] Phoebe Sharps, DO                           Medical Decision Making This patient presents to the ED with chief complaint(s) of AMS with pertinent past medical history of HTN, HLD, nephrolithiasis which further complicates the presenting complaint. The complaint involves an extensive differential diagnosis and also carries with it a high risk of complications and morbidity.    The differential diagnosis includes sepsis, UTI, pneumonia, COVID infection, pyelonephritis, nephrolithiasis, ACS, arrhythmia, electrolyte abnormality, dehydration, no signs of trauma making ICH or mass effect less likely  Additional history obtained: Additional history obtained from EMS  Records reviewed Primary Care Documents -patient additionally has history of diabetes, CVA with dysarthria and dysphagia  ED Course and Reassessment: Upon patient's arrival  to the emergency department, he was hypoxic and placed on 3 L nasal cannula, now satting 93 to 94%.  He was found to be febrile, tachycardic and tachypneic, concerning for sepsis.  No obvious focal lung sounds, however due to his hypoxia and fever and concerning for possible pneumonia or aspiration he will have chest x-ray performed.  He also had foul-smelling urine and his urine will be evaluated for source of infection.  He does have right lower quadrant abdominal pain and right flank pain, concerning for possible pyelonephritis, nephrolithiasis or appendicitis as a cause of his sepsis.  He was given Tylenol for his fever and will start fluid resuscitation.  Antibiotics will be ordered as work-up starts to result and we determine a source or if the patient meets severe sepsis criteria.  Patient's family was reported to be on their way to the hospital and further history will be obtained upon their arrival.  Independent labs interpretation:  The following labs were independently interpreted: elevated lactate and WBC count consistent with sepsis  Independent visualization of imaging: - I independently visualized the following imaging with scope of interpretation limited to determining acute life threatening conditions related to emergency care: CTAP, which revealed acute uncomplicated appendicitis  Consultation: - Consulted or discussed management/test interpretation w/ external professional: General surgery  Consideration for admission or further workup: Patient requires admission for treatment of his appendicitis Social Determinants of health: N/A    Amount and/or Complexity of Data Reviewed Labs: ordered. Radiology: ordered. ECG/medicine tests: ordered.  Risk OTC drugs. Prescription drug management. Decision regarding hospitalization.          Final Clinical Impression(s) / ED Diagnoses Final diagnoses:  Acute appendicitis, unspecified acute appendicitis type  Sepsis without  acute organ dysfunction, due to unspecified organism [facility])    Rx / DC Orders ED Discharge Orders     None         Phoebe Sharps, DO 07/20/22 1408

## 2022-07-20 NOTE — Transfer of Care (Signed)
Immediate Anesthesia Transfer of Care Note  Patient: [name]  Procedure(s) Performed: Procedure(s): APPENDECTOMY LAPAROSCOPIC (N/A)  Patient Location: PACU  Anesthesia Type:General  Level of Consciousness: Alert, Awake, possibly confused.  Airway & Oxygen Therapy: Patient Spontanous Breathing, tachypnic  Post-op Assessment: Report given to RN  Post vital signs: Reviewed and noted. Dr. Okey Dupre MDA to bedside to assess. Tachypnea, restless, language barrier. Ecologist and wife at bedside. Will continue to assess and treat accordingly.   Last Vitals:  Vitals:   07/20/22 1415 07/20/22 1430  BP: 134/79 139/89  Pulse: (!) 113 (!) 106  Resp: (!) 29 (!) 26  Temp:    SpO2: 94% 93%    Complications: Dr. Okey Dupre at PACU bedside to treat.

## 2022-07-20 NOTE — ED Triage Notes (Signed)
Pt bib ems for AMS and possible UTI.  Pt has strong urine smell upon arrival.  Pt non english speaking.

## 2022-07-21 ENCOUNTER — Observation Stay (HOSPITAL_COMMUNITY): Payer: Medicare Other

## 2022-07-21 ENCOUNTER — Encounter (HOSPITAL_COMMUNITY): Payer: Self-pay | Admitting: Surgery

## 2022-07-21 DIAGNOSIS — K35891 Other acute appendicitis without perforation, with gangrene: Secondary | ICD-10-CM

## 2022-07-21 DIAGNOSIS — G629 Polyneuropathy, unspecified: Secondary | ICD-10-CM | POA: Diagnosis present

## 2022-07-21 DIAGNOSIS — G9341 Metabolic encephalopathy: Secondary | ICD-10-CM | POA: Diagnosis not present

## 2022-07-21 DIAGNOSIS — I10 Essential (primary) hypertension: Secondary | ICD-10-CM | POA: Diagnosis not present

## 2022-07-21 DIAGNOSIS — I63412 Cerebral infarction due to embolism of left middle cerebral artery: Secondary | ICD-10-CM | POA: Diagnosis not present

## 2022-07-21 DIAGNOSIS — I6523 Occlusion and stenosis of bilateral carotid arteries: Secondary | ICD-10-CM | POA: Diagnosis not present

## 2022-07-21 DIAGNOSIS — E46 Unspecified protein-calorie malnutrition: Secondary | ICD-10-CM | POA: Diagnosis not present

## 2022-07-21 DIAGNOSIS — R6520 Severe sepsis without septic shock: Secondary | ICD-10-CM | POA: Diagnosis present

## 2022-07-21 DIAGNOSIS — H903 Sensorineural hearing loss, bilateral: Secondary | ICD-10-CM | POA: Diagnosis not present

## 2022-07-21 DIAGNOSIS — J9601 Acute respiratory failure with hypoxia: Secondary | ICD-10-CM | POA: Diagnosis not present

## 2022-07-21 DIAGNOSIS — I639 Cerebral infarction, unspecified: Secondary | ICD-10-CM | POA: Diagnosis not present

## 2022-07-21 DIAGNOSIS — J811 Chronic pulmonary edema: Secondary | ICD-10-CM | POA: Diagnosis not present

## 2022-07-21 DIAGNOSIS — J810 Acute pulmonary edema: Secondary | ICD-10-CM | POA: Diagnosis not present

## 2022-07-21 DIAGNOSIS — M79671 Pain in right foot: Secondary | ICD-10-CM | POA: Diagnosis present

## 2022-07-21 DIAGNOSIS — G319 Degenerative disease of nervous system, unspecified: Secondary | ICD-10-CM | POA: Diagnosis not present

## 2022-07-21 DIAGNOSIS — A419 Sepsis, unspecified organism: Secondary | ICD-10-CM | POA: Diagnosis present

## 2022-07-21 DIAGNOSIS — M4312 Spondylolisthesis, cervical region: Secondary | ICD-10-CM | POA: Diagnosis not present

## 2022-07-21 DIAGNOSIS — J432 Centrilobular emphysema: Secondary | ICD-10-CM | POA: Diagnosis not present

## 2022-07-21 DIAGNOSIS — Z7401 Bed confinement status: Secondary | ICD-10-CM | POA: Diagnosis not present

## 2022-07-21 DIAGNOSIS — E872 Acidosis, unspecified: Secondary | ICD-10-CM | POA: Diagnosis not present

## 2022-07-21 DIAGNOSIS — I69391 Dysphagia following cerebral infarction: Secondary | ICD-10-CM | POA: Diagnosis not present

## 2022-07-21 DIAGNOSIS — E039 Hypothyroidism, unspecified: Secondary | ICD-10-CM | POA: Diagnosis not present

## 2022-07-21 DIAGNOSIS — R404 Transient alteration of awareness: Secondary | ICD-10-CM | POA: Diagnosis not present

## 2022-07-21 DIAGNOSIS — E785 Hyperlipidemia, unspecified: Secondary | ICD-10-CM | POA: Diagnosis not present

## 2022-07-21 DIAGNOSIS — N368 Other specified disorders of urethra: Secondary | ICD-10-CM | POA: Diagnosis not present

## 2022-07-21 DIAGNOSIS — K3580 Unspecified acute appendicitis: Secondary | ICD-10-CM | POA: Diagnosis present

## 2022-07-21 DIAGNOSIS — Z20822 Contact with and (suspected) exposure to covid-19: Secondary | ICD-10-CM | POA: Diagnosis present

## 2022-07-21 DIAGNOSIS — I69322 Dysarthria following cerebral infarction: Secondary | ICD-10-CM | POA: Diagnosis not present

## 2022-07-21 DIAGNOSIS — Z8679 Personal history of other diseases of the circulatory system: Secondary | ICD-10-CM

## 2022-07-21 DIAGNOSIS — R531 Weakness: Secondary | ICD-10-CM | POA: Diagnosis not present

## 2022-07-21 DIAGNOSIS — R262 Difficulty in walking, not elsewhere classified: Secondary | ICD-10-CM | POA: Diagnosis not present

## 2022-07-21 DIAGNOSIS — Z603 Acculturation difficulty: Secondary | ICD-10-CM | POA: Diagnosis present

## 2022-07-21 DIAGNOSIS — G8191 Hemiplegia, unspecified affecting right dominant side: Secondary | ICD-10-CM | POA: Diagnosis present

## 2022-07-21 DIAGNOSIS — R270 Ataxia, unspecified: Secondary | ICD-10-CM | POA: Diagnosis not present

## 2022-07-21 DIAGNOSIS — M6281 Muscle weakness (generalized): Secondary | ICD-10-CM | POA: Diagnosis not present

## 2022-07-21 DIAGNOSIS — I517 Cardiomegaly: Secondary | ICD-10-CM | POA: Diagnosis not present

## 2022-07-21 DIAGNOSIS — Z743 Need for continuous supervision: Secondary | ICD-10-CM | POA: Diagnosis not present

## 2022-07-21 DIAGNOSIS — Z9181 History of falling: Secondary | ICD-10-CM

## 2022-07-21 DIAGNOSIS — R0609 Other forms of dyspnea: Secondary | ICD-10-CM | POA: Diagnosis not present

## 2022-07-21 DIAGNOSIS — N189 Chronic kidney disease, unspecified: Secondary | ICD-10-CM | POA: Diagnosis not present

## 2022-07-21 DIAGNOSIS — Z789 Other specified health status: Secondary | ICD-10-CM

## 2022-07-21 DIAGNOSIS — I6389 Other cerebral infarction: Secondary | ICD-10-CM | POA: Diagnosis not present

## 2022-07-21 DIAGNOSIS — K3532 Acute appendicitis with perforation and localized peritonitis, without abscess: Secondary | ICD-10-CM | POA: Diagnosis not present

## 2022-07-21 DIAGNOSIS — M79672 Pain in left foot: Secondary | ICD-10-CM | POA: Diagnosis present

## 2022-07-21 DIAGNOSIS — R1312 Dysphagia, oropharyngeal phase: Secondary | ICD-10-CM | POA: Diagnosis present

## 2022-07-21 DIAGNOSIS — I6782 Cerebral ischemia: Secondary | ICD-10-CM | POA: Diagnosis not present

## 2022-07-21 DIAGNOSIS — S065X9S Traumatic subdural hemorrhage with loss of consciousness of unspecified duration, sequela: Secondary | ICD-10-CM | POA: Diagnosis not present

## 2022-07-21 DIAGNOSIS — R471 Dysarthria and anarthria: Secondary | ICD-10-CM | POA: Diagnosis not present

## 2022-07-21 DIAGNOSIS — G9340 Encephalopathy, unspecified: Secondary | ICD-10-CM | POA: Diagnosis not present

## 2022-07-21 DIAGNOSIS — E559 Vitamin D deficiency, unspecified: Secondary | ICD-10-CM | POA: Diagnosis not present

## 2022-07-21 DIAGNOSIS — R32 Unspecified urinary incontinence: Secondary | ICD-10-CM | POA: Diagnosis present

## 2022-07-21 LAB — ECHOCARDIOGRAM COMPLETE
Calc EF: 68.7 %
Height: 63 in
S' Lateral: 3.2 cm
Single Plane A2C EF: 69.6 %
Single Plane A4C EF: 66.4 %
Weight: 2585.55 oz

## 2022-07-21 LAB — CBC WITH DIFFERENTIAL/PLATELET
Abs Immature Granulocytes: 0.09 10*3/uL — ABNORMAL HIGH (ref 0.00–0.07)
Basophils Absolute: 0 10*3/uL (ref 0.0–0.1)
Basophils Relative: 0 %
Eosinophils Absolute: 0 10*3/uL (ref 0.0–0.5)
Eosinophils Relative: 0 %
HCT: 43.7 % (ref 39.0–52.0)
Hemoglobin: 14.8 g/dL (ref 13.0–17.0)
Immature Granulocytes: 1 %
Lymphocytes Relative: 10 %
Lymphs Abs: 1.5 10*3/uL (ref 0.7–4.0)
MCH: 33 pg (ref 26.0–34.0)
MCHC: 33.9 g/dL (ref 30.0–36.0)
MCV: 97.3 fL (ref 80.0–100.0)
Monocytes Absolute: 1 10*3/uL (ref 0.1–1.0)
Monocytes Relative: 7 %
Neutro Abs: 12.3 10*3/uL — ABNORMAL HIGH (ref 1.7–7.7)
Neutrophils Relative %: 82 %
Platelets: 160 10*3/uL (ref 150–400)
RBC: 4.49 MIL/uL (ref 4.22–5.81)
RDW: 12.9 % (ref 11.5–15.5)
WBC: 15 10*3/uL — ABNORMAL HIGH (ref 4.0–10.5)
nRBC: 0 % (ref 0.0–0.2)

## 2022-07-21 LAB — BLOOD CULTURE ID PANEL (REFLEXED) - BCID2

## 2022-07-21 LAB — COMPREHENSIVE METABOLIC PANEL
ALT: 22 U/L (ref 0–44)
AST: 44 U/L — ABNORMAL HIGH (ref 15–41)
Albumin: 3.8 g/dL (ref 3.5–5.0)
Alkaline Phosphatase: 28 U/L — ABNORMAL LOW (ref 38–126)
Anion gap: 8 (ref 5–15)
BUN: 15 mg/dL (ref 8–23)
CO2: 29 mmol/L (ref 22–32)
Calcium: 8.8 mg/dL — ABNORMAL LOW (ref 8.9–10.3)
Chloride: 102 mmol/L (ref 98–111)
Creatinine, Ser: 0.88 mg/dL (ref 0.61–1.24)
GFR, Estimated: >60 mL/min (ref >60)
Glucose, Bld: 170 mg/dL — ABNORMAL HIGH (ref 70–99)
Potassium: 4.3 mmol/L (ref 3.5–5.1)
Sodium: 139 mmol/L (ref 135–145)
Total Bilirubin: 1.5 mg/dL — ABNORMAL HIGH (ref 0.3–1.2)
Total Protein: 7.6 g/dL (ref 6.5–8.1)

## 2022-07-21 LAB — LACTIC ACID, PLASMA
Lactic Acid, Venous: 2.1 mmol/L (ref 0.5–1.9)
Lactic Acid, Venous: 2.1 mmol/L (ref 0.5–1.9)

## 2022-07-21 LAB — URINE CULTURE: Culture: NO GROWTH

## 2022-07-21 LAB — RPR: RPR Ser Ql: NONREACTIVE

## 2022-07-21 MED ORDER — ACETAMINOPHEN 500 MG PO TABS
500.0000 mg | ORAL_TABLET | Freq: Three times a day (TID) | ORAL | Status: DC
  Administered 2022-07-21 – 2022-07-22 (×4): 500 mg via ORAL
  Filled 2022-07-21 (×4): qty 1

## 2022-07-21 MED ORDER — FUROSEMIDE 10 MG/ML IJ SOLN
20.0000 mg | Freq: Once | INTRAMUSCULAR | Status: AC
  Administered 2022-07-21: 20 mg via INTRAVENOUS
  Filled 2022-07-21: qty 2

## 2022-07-21 MED ORDER — DIPHENHYDRAMINE HCL 50 MG/ML IJ SOLN: 12.5000 mg | Freq: Four times a day (QID) | INTRAMUSCULAR | Status: DC | PRN

## 2022-07-21 MED ORDER — SIMETHICONE 40 MG/0.6ML PO SUSP: 80.0000 mg | Freq: Four times a day (QID) | ORAL | Status: DC | PRN

## 2022-07-21 MED ORDER — PHENOL 1.4 % MT LIQD: 2.0000 | OROMUCOSAL | Status: DC | PRN

## 2022-07-21 MED ORDER — ALUM & MAG HYDROXIDE-SIMETH 200-200-20 MG/5ML PO SUSP: 30.0000 mL | Freq: Four times a day (QID) | ORAL | Status: DC | PRN

## 2022-07-21 MED ORDER — HYDROMORPHONE HCL 1 MG/ML IJ SOLN
0.5000 mg | INTRAMUSCULAR | Status: DC | PRN
  Administered 2022-07-21 (×2): 0.5 mg via INTRAVENOUS
  Administered 2022-07-22 – 2022-07-23 (×3): 1 mg via INTRAVENOUS
  Filled 2022-07-21 (×5): qty 1

## 2022-07-21 MED ORDER — HYDROMORPHONE HCL 1 MG/ML IJ SOLN: 0.5000 mg | INTRAMUSCULAR | Status: DC | PRN

## 2022-07-21 MED ORDER — BISACODYL 10 MG RE SUPP: 10.0000 mg | Freq: Two times a day (BID) | RECTAL | Status: DC | PRN

## 2022-07-21 MED ORDER — MAGIC MOUTHWASH: 15.0000 mL | Freq: Four times a day (QID) | ORAL | Status: DC | PRN

## 2022-07-21 MED ORDER — METHOCARBAMOL 1000 MG/10ML IJ SOLN: 1000.0000 mg | Freq: Four times a day (QID) | INTRAVENOUS | Status: DC | PRN

## 2022-07-21 MED ORDER — METHOCARBAMOL 500 MG PO TABS: 1000.0000 mg | ORAL_TABLET | Freq: Four times a day (QID) | ORAL | Status: DC | PRN

## 2022-07-21 MED ORDER — MENTHOL 3 MG MT LOZG: 1.0000 | LOZENGE | OROMUCOSAL | Status: DC | PRN

## 2022-07-21 MED ORDER — CALCIUM POLYCARBOPHIL 625 MG PO TABS
625.0000 mg | ORAL_TABLET | Freq: Two times a day (BID) | ORAL | Status: DC
  Administered 2022-07-21 – 2022-07-31 (×19): 625 mg via ORAL
  Filled 2022-07-21 (×19): qty 1

## 2022-07-21 MED ORDER — PIPERACILLIN-TAZOBACTAM 3.375 G IVPB
3.3750 g | Freq: Three times a day (TID) | INTRAVENOUS | Status: AC
  Administered 2022-07-21 – 2022-07-26 (×15): 3.375 g via INTRAVENOUS
  Filled 2022-07-21 (×16): qty 50

## 2022-07-21 MED ORDER — PERFLUTREN LIPID MICROSPHERE
1.0000 mL | INTRAVENOUS | Status: AC | PRN
  Administered 2022-07-21: 3 mL via INTRAVENOUS

## 2022-07-21 MED ORDER — SODIUM CHLORIDE 0.9 % IV SOLN: 8.0000 mg | Freq: Four times a day (QID) | INTRAVENOUS | Status: DC | PRN

## 2022-07-21 MED ORDER — ONDANSETRON HCL 4 MG/2ML IJ SOLN: 4.0000 mg | Freq: Four times a day (QID) | INTRAMUSCULAR | Status: DC | PRN

## 2022-07-21 MED ORDER — LIP MEDEX EX OINT
TOPICAL_OINTMENT | Freq: Two times a day (BID) | CUTANEOUS | Status: DC
  Administered 2022-07-21 – 2022-07-22 (×3): 75 via TOPICAL
  Administered 2022-07-23: 1 via TOPICAL
  Administered 2022-07-28 – 2022-07-29 (×2): 75 via TOPICAL
  Filled 2022-07-21 (×6): qty 7

## 2022-07-21 NOTE — Progress Notes (Addendum)
PROGRESS NOTE    [name]  MGQ:[id number] DOB: [birth year] DOA: 07/20/2022 PCP: Deatra James, MD    Chief Complaint  Patient presents with   Altered Mental Status   Urinary Frequency    Brief Narrative:    [name] is a pleasant 87 y.o. male with past medical history of hypertension, hypothyroidism, hyperlipidemia, nephrolithiasis, and SDH s/p evacuation in 2016, now presenting to the emergency department with lethargy, confusion, and fall. He had a fall overnight while trying to use the bathroom, was confused and unable to get up on his own. His wife was unable to help him up from the floor and called EMS.  He was found to be saturating in the 80s on room air, was placed on nonrebreather, and brought into the ED. Patient was admitted by surgery and taken to the OR  on 9/8 for laparoscopic appendectomy where he was found to have acute gangrenous perforated appendicitis.  He emerged from anesthesia hypertensive, tachycardic, wheezing, and hypoxic.  He had a chest x-ray concerning for developing pulmonary edema, was given 40 mg IV Lasix, Xopenex, and transferred to the ICU.   Pt seen and examined at bedside. He denies any abdominal pain, nausea. Able to tolerate liquid diet.     Assessment & Plan:   Principal Problem:   Acute gangrenous appendicitis s/p lap appendectomy 07/20/2022 Active Problems:   Ataxia   Hypertension   Hyperlipidemia   Hypothyroidism   Acute encephalopathy   Severe sepsis (HCC)   Acute respiratory failure with hypoxia (HCC)   Acute pulmonary edema (HCC)   Non-English speaking patient   History of subdural hematoma   History of fall   Acute appendicitis  Acute respiratory failure with hypoxia initially requiring 10 lit of [place] oxygen Today were able to wean oxygen to 5 lit of  oxygen.  Cxr this am shows mild vascular congestion.  Another dose of lasix 20 IV today.  Echocardiogram showed preserved LVEF.    Acute metabolic encephalopathy:   Patient had increased fatigue and was less verbal for ~1 wk before become delirious lethargic last night  TSH wnl, b12 levels low normal .  UA Is negative. RPR is Negative. Initial CT head does not show any acute intracranial abnormality.  This am patient is alert and answered simple questions appropriately regarding his pain and breathing.     Hypertension:  Bp parameters have improved.     Hypothyroidism;  Resume synthroid.    Sepsis secondary to perforated gangrenous appendiciits Currently on broad spectrum IV antibiotics.  Gen surgery on board and primary.  Trend lactate.     Antimicrobials:  Antibiotics Given (last 72 hours)     Date/Time Action Medication Dose Rate   07/20/22 1007 New Bag/Given   cefTRIAXone (ROCEPHIN) 1 g in sodium chloride 0.9 % 100 mL IVPB 1 g 200 mL/hr   07/20/22 1136 New Bag/Given   cefTRIAXone (ROCEPHIN) 1 g in sodium chloride 0.9 % 100 mL IVPB 1 g 200 mL/hr   07/20/22 1143 New Bag/Given   metroNIDAZOLE (FLAGYL) IVPB 500 mg 500 mg 100 mL/hr   07/20/22 2019 New Bag/Given   ceFEPIme (MAXIPIME) 2 g in sodium chloride 0.9 % 100 mL IVPB 2 g 200 mL/hr   07/20/22 2055 New Bag/Given   metroNIDAZOLE (FLAGYL) IVPB 500 mg 500 mg 100 mL/hr   07/21/22 0510 New Bag/Given   ceFEPIme (MAXIPIME) 2 g in sodium chloride 0.9 % 100 mL IVPB 2 g 200 mL/hr   07/21/22 0855 New Bag/Given  piperacillin-tazobactam (ZOSYN) IVPB 3.375 g 3.375 g 12.5 mL/hr         Subjective: No abdominal pain, alert and answering questions appropriately.   Objective: Vitals:   07/21/22 0953 07/21/22 1000 07/21/22 1100 07/21/22 1207  BP:  110/70  126/75  Pulse: (!) 102 (!) 103 100 96  Resp: 18 (!) 24 (!) 21 (!) 21  Temp:    98.9 F (37.2 C)  TempSrc:    Oral  SpO2: 95% 92% 98% 96%  Weight:      Height:        Intake/Output Summary (Last 24 hours) at 07/21/2022 1423 Last data filed at 07/21/2022 1331 Gross per 24 hour  Intake 2786.07 ml  Output 3485 ml  Net -698.93 ml    Filed Weights   07/20/22 0847 07/20/22 2015  Weight: 77.1 kg 73.3 kg    Examination:  General exam: Appears calm and comfortable  Respiratory system: Clear to auscultation. Respiratory effort normal. Cardiovascular system: S1 & S2 heard, RRR. No JVD, No pedal edema. Gastrointestinal system: Abdomen is nondistended, soft and nontender.  Normal bowel sounds heard. Central nervous system: Alert and oriented. No focal neurological deficits. Extremities: Symmetric 5 x 5 power. Skin: No rashes, lesions or ulcers Psychiatry: Mood & affect appropriate.     Data Reviewed: I have personally reviewed following labs and imaging studies  CBC: Recent Labs  Lab 07/20/22 0900 07/21/22 1041  WBC 12.8* 15.0*  NEUTROABS 10.6* 12.3*  HGB 15.6 14.8  HCT 45.6 43.7  MCV 95.0 97.3  PLT 172 160    Basic Metabolic Panel: Recent Labs  Lab 07/20/22 0900 07/21/22 1041  NA 136 139  K 4.4 4.3  CL 101 102  CO2 25 29  GLUCOSE 186* 170*  BUN 10 15  CREATININE 0.79 0.88  CALCIUM 9.3 8.8*    GFR: Estimated Creatinine Clearance: 55.1 mL/min (by C-G formula based on SCr of 0.88 mg/dL).  Liver Function Tests: Recent Labs  Lab 07/20/22 0900 07/21/22 1041  AST 26 44*  ALT 18 22  ALKPHOS 32* 28*  BILITOT 1.5* 1.5*  PROT 7.6 7.6  ALBUMIN 4.1 3.8    CBG: Recent Labs  Lab 07/20/22 0924  GLUCAP 174*     Recent Results (from the past 240 hour(s))  Culture, blood (Routine x 2)     Status: None (Preliminary result)   Collection Time: 07/20/22  9:00 AM   Specimen: BLOOD  Result Value Ref Range Status   Specimen Description   Final    BLOOD RIGHT ANTECUBITAL Performed at [facility], [address] [place], [place] [address]    Special Requests   Final    BOTTLES DRAWN AEROBIC AND ANAEROBIC Blood Culture results may not be optimal due to an excessive volume of blood received in culture bottles Performed at [facility], [address]  [place], [place] [address]    Culture   Final    NO GROWTH < 24 HOURS Performed at [facility], [address] [place], [place] [address]    Report Status PENDING  Incomplete  Urine Culture     Status: None   Collection Time: 07/20/22  9:00 AM   Specimen: In/Out Cath Urine  Result Value Ref Range Status   Specimen Description   Final    IN/OUT CATH URINE Performed at [facility], [address] [place], [place] [address]    Special Requests   Final    NONE  Performed at [facility], [address], [place], [place] [address]    Culture   Final    NO GROWTH Performed at [facility], [address] [place], [place] [address]    Report Status 07/21/2022 FINAL  Final  Culture, blood (Routine x 2)     Status: None (Preliminary result)   Collection Time: 07/20/22  9:12 AM   Specimen: BLOOD  Result Value Ref Range Status   Specimen Description   Final    BLOOD LEFT ANTECUBITAL Performed at [facility], [address] [place], [place] [address]    Special Requests   Final    BOTTLES DRAWN AEROBIC AND ANAEROBIC Blood Culture results may not be optimal due to an excessive volume of blood received in culture bottles Performed at [facility], [address] [place], [place] [address]    Culture   Final    NO GROWTH < 24 HOURS Performed at [facility], [address] [place], [place] [address]    Report Status PENDING  Incomplete  Resp Panel by RT-PCR (Flu A&B, Covid) Anterior Nasal Swab     Status: None   Collection Time: 07/20/22  9:21 AM   Specimen: Anterior Nasal Swab  Result Value Ref Range Status   SARS Coronavirus 2 by RT PCR NEGATIVE NEGATIVE Final    Comment: (NOTE) SARS-CoV-2 target nucleic acids are NOT DETECTED.  The SARS-CoV-2 RNA is generally detectable in upper respiratory specimens during the acute phase of infection. The lowest concentration of SARS-CoV-2 viral  copies this assay can detect is 138 copies/mL. A negative result does not preclude SARS-Cov-2 infection and should not be used as the sole basis for treatment or other patient management decisions. A negative result may occur with  improper specimen collection/handling, submission of specimen other than nasopharyngeal swab, presence of viral mutation(s) within the areas targeted by this assay, and inadequate number of viral copies(<138 copies/mL). A negative result must be combined with clinical observations, patient history, and epidemiological information. The expected result is Negative.  Fact Sheet for Patients:  BloggerCourse.com  Fact Sheet for Healthcare Providers:  SeriousBroker.it  This test is no t yet approved or cleared by the [place] FDA and  has been authorized for detection and/or diagnosis of SARS-CoV-2 by FDA under an Emergency Use Authorization (EUA). This EUA will remain  in effect (meaning this test can be used) for the duration of the COVID-19 declaration under Section 564(b)(1) of the Act, 21 U.S.C.section 360bbb-3(b)(1), unless the authorization is terminated  or revoked sooner.       Influenza A by PCR NEGATIVE NEGATIVE Final   Influenza B by PCR NEGATIVE NEGATIVE Final    Comment: (NOTE) The Xpert Xpress SARS-CoV-2/FLU/RSV plus assay is intended as an aid in the diagnosis of influenza from Nasopharyngeal swab specimens and should not be used as a sole basis for treatment. Nasal washings and aspirates are unacceptable for Xpert Xpress SARS-CoV-2/FLU/RSV testing.  Fact Sheet for Patients: BloggerCourse.com  Fact Sheet for Healthcare Providers: SeriousBroker.it  This test is not yet approved or cleared by the [place] FDA and has been authorized for detection and/or diagnosis of SARS-CoV-2 by FDA under an Emergency Use Authorization (EUA). This  EUA will remain in effect (meaning this test can be used) for the duration of the COVID-19 declaration under Section 564(b)(1) of the Act, 21 U.S.C. section 360bbb-3(b)(1), unless the authorization is terminated or revoked.  Performed at [facility], [address], [place], [place] [address]   MRSA Next Gen by PCR, Nasal     Status: None   Collection Time: 07/20/22  7:40 PM   Specimen: Nasal Mucosa; Nasal Swab  Result Value Ref Range Status   MRSA by PCR Next Gen NOT DETECTED NOT DETECTED Final    Comment: (NOTE) The GeneXpert MRSA Assay (FDA approved for NASAL specimens only), is one component of a comprehensive MRSA colonization surveillance program. It is not intended to diagnose MRSA infection nor to guide or monitor treatment for MRSA infections. Test performance is not FDA approved in patients less than 20 years old. Performed at [facility], [address], [place], [place] [address]          Radiology Studies: DG CHEST PORT 1 VIEW  Result Date: 07/21/2022 CLINICAL DATA:  Altered mental status and urinary frequency. Laparoscopic appendectomy 07/20/2022. EXAM: PORTABLE CHEST 1 VIEW COMPARISON:  07/20/2022 FINDINGS: Lungs are hypoinflated with mild hazy prominence of the pulmonary vasculature suggesting mild vascular congestion. No lobar consolidation or effusion. Mild stable cardiomegaly. Remainder of the exam is unchanged. IMPRESSION: Mild stable cardiomegaly with suggestion of mild vascular congestion. Electronically Signed   By: Elberta Fortis M.D.   On: 07/21/2022 10:02   ECHOCARDIOGRAM COMPLETE  Result Date: 07/21/2022    ECHOCARDIOGRAM REPORT   Patient Name:   [name] Date of Exam: 07/21/2022 Medical Rec #:  [id number]       Height:       63.0 in Accession #:    [id number]      Weight:       161.6 lb Date of Birth:  11/22/1934       BSA:          1.766 m Patient Age:    87 years        BP:           97/64 mmHg Patient Gender:  M               HR:           102 bpm. Exam Location:  Inpatient Procedure: 2D Echo, Cardiac Doppler, Color Doppler and Intracardiac            Opacification Agent Indications:    Dyspnea R06.00  History:        Patient has no prior history of Echocardiogram examinations.                 Risk Factors:Hypertension and Dyslipidemia. Acute hypoxic                 respiratory failure. Acute encephalopathy. Sepsis secondary to                 acute appendicitis s/p appendectomy. Hypothyroidism.  Sonographer:    Leta Jungling RDCS Referring Phys: [id number] TIMOTHY S OPYD  Sonographer Comments: Suboptimal apical window and suboptimal parasternal window. IMPRESSIONS  1. Left ventricular ejection fraction, by estimation, is 65 to 70%. The left ventricle has normal function. The left ventricle has no regional wall motion abnormalities. Left ventricular diastolic function could not be evaluated.  2. Right ventricular systolic function is normal. The right ventricular size is normal.  3. The mitral valve is normal in structure. No evidence of mitral valve regurgitation. No evidence of mitral stenosis.  4. The aortic valve is tricuspid. Aortic valve regurgitation is not visualized. Aortic valve sclerosis/calcification is present, without any evidence of aortic stenosis.  5.  Aortic dilatation noted. There is mild dilatation of the aortic root, measuring 39 mm.  6. The inferior vena cava is normal in size with greater than 50% respiratory variability, suggesting right atrial pressure of 3 mmHg. FINDINGS  Left Ventricle: Left ventricular ejection fraction, by estimation, is 65 to 70%. The left ventricle has normal function. The left ventricle has no regional wall motion abnormalities. Definity contrast agent was given IV to delineate the left ventricular  endocardial borders. The left ventricular internal cavity size was normal in size. There is no left ventricular hypertrophy. Left ventricular diastolic function could not be  evaluated. Right Ventricle: The right ventricular size is normal. No increase in right ventricular wall thickness. Right ventricular systolic function is normal. Left Atrium: Left atrial size was normal in size. Right Atrium: Right atrial size was normal in size. Pericardium: There is no evidence of pericardial effusion. Mitral Valve: The mitral valve is normal in structure. No evidence of mitral valve regurgitation. No evidence of mitral valve stenosis. Tricuspid Valve: The tricuspid valve is normal in structure. Tricuspid valve regurgitation is not demonstrated. No evidence of tricuspid stenosis. Aortic Valve: The aortic valve is tricuspid. Aortic valve regurgitation is not visualized. Aortic valve sclerosis/calcification is present, without any evidence of aortic stenosis. Pulmonic Valve: The pulmonic valve was normal in structure. Pulmonic valve regurgitation is not visualized. No evidence of pulmonic stenosis. Aorta: Aortic dilatation noted. There is mild dilatation of the aortic root, measuring 39 mm. Venous: The inferior vena cava is normal in size with greater than 50% respiratory variability, suggesting right atrial pressure of 3 mmHg. IAS/Shunts: No atrial level shunt detected by color flow Doppler.  LEFT VENTRICLE PLAX 2D LVIDd:         4.30 cm LVIDs:         3.20 cm LV PW:         1.00 cm LV IVS:        1.10 cm LVOT diam:     2.20 cm LV SV:         48 LV SV Index:   27 LVOT Area:     3.80 cm  LV Volumes (MOD) LV vol d, MOD A2C: 47.0 ml LV vol d, MOD A4C: 59.3 ml LV vol s, MOD A2C: 14.3 ml LV vol s, MOD A4C: 19.9 ml LV SV MOD A2C:     32.7 ml LV SV MOD A4C:     59.3 ml LV SV MOD BP:      37.0 ml LEFT ATRIUM             Index LA diam:        2.80 cm 1.59 cm/m LA Vol (A2C):   21.9 ml 12.40 ml/m LA Vol (A4C):   28.7 ml 16.25 ml/m LA Biplane Vol: 25.9 ml 14.66 ml/m  AORTIC VALVE LVOT Vmax:   73.80 cm/s LVOT Vmean:  48.300 cm/s LVOT VTI:    0.126 m  AORTA Ao Root diam: 3.90 cm Ao Asc diam:  3.50 cm   SHUNTS Systemic VTI:  0.13 m Systemic Diam: 2.20 cm Armanda Magic MD Electronically signed by Armanda Magic MD Signature Date/Time: 07/21/2022/9:03:12 AM    Final    X-ray chest PA or AP  Result Date: 07/20/2022 CLINICAL DATA:  Shortness of breath following appendectomy EXAM: CHEST  1 VIEW COMPARISON:  07/20/2022 at 0931 hours FINDINGS: Stable cardiomediastinal contours. Low lung volumes. Increasing interstitial opacities throughout both lungs. No large pleural fluid collection. No pneumothorax. IMPRESSION: Increasing interstitial  opacities throughout both lungs may reflect pulmonary edema. Electronically Signed   By: Duanne Guess D.O.   On: 07/20/2022 18:35   CT Abdomen Pelvis W Contrast  Result Date: 07/20/2022 CLINICAL DATA:  Right lower quadrant pain EXAM: CT ABDOMEN AND PELVIS WITH CONTRAST TECHNIQUE: Multidetector CT imaging of the abdomen and pelvis was performed using the standard protocol following bolus administration of intravenous contrast. RADIATION DOSE REDUCTION: This exam was performed according to the departmental dose-optimization program which includes automated exposure control, adjustment of the mA and/or kV according to patient size and/or use of iterative reconstruction technique. CONTRAST:  OMNIPAQUE IOHEXOL 300 MG/ML  SOLN COMPARISON:  08/26/2020 FINDINGS: Lower chest: Tortuosity of the descending thoracic aorta. Coronary artery atherosclerosis. Hepatobiliary: Mildly decreased attenuation of the hepatic parenchyma. No focal liver lesion is identified. Unremarkable gallbladder. No hyperdense gallstone. No biliary dilatation. Pancreas: Unremarkable. No pancreatic ductal dilatation or surrounding inflammatory changes. Spleen: Normal in size without focal abnormality. Adrenals/Urinary Tract: Adrenal glands are unremarkable. Kidneys are normal, without renal calculi, solid lesion, or hydronephrosis. Mild urinary bladder wall thickening. The visualized urethra is moderately distended  with fluid along its full length with urothelial enhancement (series 6, image 75). Stomach/Bowel: The appendix is abnormally dilated and hyperenhancing with surrounding periappendiceal fat stranding and trace free fluid. No organized periappendiceal fluid collection. No extraluminal air. No dilated loops of bowel to suggest obstruction. Stomach within normal limits. No additional sites of focal bowel inflammation. Vascular/Lymphatic: Scattered aortoiliac atherosclerotic calcifications without aneurysm. No abdominopelvic lymphadenopathy. Reproductive: Prostate is unremarkable. Other: No ascites. No organized abdominopelvic fluid collection. No pneumoperitoneum. No abdominal wall hernia. 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 No new or acute bony findings. IMPRESSION: 1. Acute uncomplicated appendicitis. 2. Mild urinary bladder wall thickening. The visualized urethra is moderately distended with diffuse urothelial enhancement. Findings suspicious for urethritis and cystitis. Correlate with urinalysis. The possibility of a distal urethral stricture is also a consideration. 3. Hepatic steatosis. 4. Aortic and coronary artery atherosclerosis (ICD10-I70.0). Electronically Signed   By: Duanne Guess D.O.   On: 07/20/2022 11:02   CT Head Wo Contrast  Result Date: 07/20/2022 CLINICAL DATA:  Status post fall. EXAM: CT HEAD WITHOUT CONTRAST CT CERVICAL SPINE WITHOUT CONTRAST TECHNIQUE: Multidetector CT imaging of the head and cervical spine was performed following the standard protocol without intravenous contrast. Multiplanar CT image reconstructions of the cervical spine were also generated. RADIATION DOSE REDUCTION: This exam was performed according to the departmental dose-optimization program which includes automated exposure control, adjustment of the mA and/or kV according to patient size and/or use of iterative reconstruction technique.  COMPARISON:  02/15/2015 head CT. FINDINGS: CT HEAD FINDINGS Brain: There is no evidence for acute hemorrhage, hydrocephalus, mass lesion, or abnormal extra-axial fluid collection. No definite CT evidence for acute infarction. Diffuse loss of parenchymal volume is consistent with atrophy. Patchy low attenuation in the deep hemispheric and periventricular white matter is nonspecific, but likely reflects chronic microvascular ischemic demyelination. Vascular: No hyperdense vessel or unexpected calcification. Skull: No evidence for fracture. No worrisome lytic or sclerotic lesion. Right-sided burr holes evident. Sinuses/Orbits: The visualized paranasal sinuses and mastoid air cells are clear. Visualized portions of the globes and intraorbital fat are unremarkable. Other: None. CT CERVICAL SPINE FINDINGS Alignment: Trace anterolisthesis of C4 on 5 and C5 on 6 is compatible with a degree of facet disease at those levels. No evidence for traumatic subluxation. Skull base and vertebrae: No acute fracture. No  primary bone lesion or focal pathologic process. Soft tissues and spinal canal: No prevertebral fluid or swelling. No visible canal hematoma. Disc levels: Mild loss of disc height noted C6-7 and C7-T1. Diffuse facet osteoarthritis noted bilaterally. Upper chest: Centrilobular emphysema. Other: None. IMPRESSION: 1. No acute intracranial abnormality. 2. Atrophy with chronic small vessel white matter ischemic disease. 3. No evidence for cervical spine fracture or traumatic subluxation. 4. Degenerative changes in the cervical spine as above. Electronically Signed   By: Kennith Center M.D.   On: 07/20/2022 10:52   CT Cervical Spine Wo Contrast  Result Date: 07/20/2022 CLINICAL DATA:  Status post fall. EXAM: CT HEAD WITHOUT CONTRAST CT CERVICAL SPINE WITHOUT CONTRAST TECHNIQUE: Multidetector CT imaging of the head and cervical spine was performed following the standard protocol without intravenous contrast. Multiplanar CT  image reconstructions of the cervical spine were also generated. RADIATION DOSE REDUCTION: This exam was performed according to the departmental dose-optimization program which includes automated exposure control, adjustment of the mA and/or kV according to patient size and/or use of iterative reconstruction technique. COMPARISON:  02/15/2015 head CT. FINDINGS: CT HEAD FINDINGS Brain: There is no evidence for acute hemorrhage, hydrocephalus, mass lesion, or abnormal extra-axial fluid collection. No definite CT evidence for acute infarction. Diffuse loss of parenchymal volume is consistent with atrophy. Patchy low attenuation in the deep hemispheric and periventricular white matter is nonspecific, but likely reflects chronic microvascular ischemic demyelination. Vascular: No hyperdense vessel or unexpected calcification. Skull: No evidence for fracture. No worrisome lytic or sclerotic lesion. Right-sided burr holes evident. Sinuses/Orbits: The visualized paranasal sinuses and mastoid air cells are clear. Visualized portions of the globes and intraorbital fat are unremarkable. Other: None. CT CERVICAL SPINE FINDINGS Alignment: Trace anterolisthesis of C4 on 5 and C5 on 6 is compatible with a degree of facet disease at those levels. No evidence for traumatic subluxation. Skull base and vertebrae: No acute fracture. No primary bone lesion or focal pathologic process. Soft tissues and spinal canal: No prevertebral fluid or swelling. No visible canal hematoma. Disc levels: Mild loss of disc height noted C6-7 and C7-T1. Diffuse facet osteoarthritis noted bilaterally. Upper chest: Centrilobular emphysema. Other: None. IMPRESSION: 1. No acute intracranial abnormality. 2. Atrophy with chronic small vessel white matter ischemic disease. 3. No evidence for cervical spine fracture or traumatic subluxation. 4. Degenerative changes in the cervical spine as above. Electronically Signed   By: Kennith Center M.D.   On: 07/20/2022  10:52   DG Chest 2 View  Result Date: 07/20/2022 CLINICAL DATA:  Suspected sepsis EXAM: CHEST - 2 VIEW COMPARISON:  Chest x-ray dated December 24, 2010 FINDINGS: Cardiac and mediastinal contours are within normal limits for technique. Low lung volumes with hypoventilatory changes. No definite focal consolidation. No large pleural effusion or pneumothorax. IMPRESSION: Low lung volumes with hypoventilatory changes. No definite focal consolidation. Electronically Signed   By: Allegra Lai M.D.   On: 07/20/2022 09:45        Scheduled Meds:  acetaminophen  500 mg Oral TID WC & HS   Chlorhexidine Gluconate Cloth  6 each Topical Q0600   levothyroxine  50 mcg Oral QAC breakfast   lip balm   Topical BID   losartan  25 mg Oral Daily   polycarbophil  625 mg Oral BID   Continuous Infusions:  sodium chloride 10 mL/hr at 07/21/22 1331   methocarbamol (ROBAXIN) IV     ondansetron (ZOFRAN) IV     piperacillin-tazobactam (ZOSYN)  IV Stopped (07/21/22 1300)  LOS: 0 days    Time spent: 36 minutes     Kathlen Mody, MD [facility]   To contact the attending provider between 7A-7P or the covering provider during after hours 7P-7A, please log into the web site [email or web address] and access using universal [place] password for that web site. If you do not have the password, please call the hospital operator.  07/21/2022, 2:23 PM    Addendum: notified by pharmacy that patient's blood cultures growing staph, possibly contaminant, will monitor.    Kathlen Mody .

## 2022-07-21 NOTE — Progress Notes (Signed)
PHARMACY - PHYSICIAN COMMUNICATION CRITICAL VALUE ALERT - BLOOD CULTURE IDENTIFICATION (BCID)  [name] is an 87 y.o. male who presented to [facility] on 07/20/2022 with a chief complaint of acute gangrenous appendicitis s/p lap appendectomy 07/20/2022.  Assessment:  1 of 4 blood cultures growing staph species, likely contaminant  Name of physician (or Provider) Contacted: Dr. Michaell Cowing  Current antibiotics: Zosyn  Changes to prescribed antibiotics recommended:  Patient is on recommended antibiotics - No changes needed  Results for orders placed or performed during the hospital encounter of 07/20/22  Blood Culture ID Panel (Reflexed) (Collected: 07/20/2022  9:12 AM)  Result Value Ref Range   Enterococcus faecalis NOT DETECTED NOT DETECTED   Enterococcus Faecium NOT DETECTED NOT DETECTED   Listeria monocytogenes NOT DETECTED NOT DETECTED   Staphylococcus species DETECTED (A) NOT DETECTED   Staphylococcus aureus (BCID) NOT DETECTED NOT DETECTED   Staphylococcus epidermidis NOT DETECTED NOT DETECTED   Staphylococcus lugdunensis NOT DETECTED NOT DETECTED   Streptococcus species NOT DETECTED NOT DETECTED   Streptococcus agalactiae NOT DETECTED NOT DETECTED   Streptococcus pneumoniae NOT DETECTED NOT DETECTED   Streptococcus pyogenes NOT DETECTED NOT DETECTED   A.calcoaceticus-baumannii NOT DETECTED NOT DETECTED   Bacteroides fragilis NOT DETECTED NOT DETECTED   Enterobacterales NOT DETECTED NOT DETECTED   Enterobacter cloacae complex NOT DETECTED NOT DETECTED   Escherichia coli NOT DETECTED NOT DETECTED   Klebsiella aerogenes NOT DETECTED NOT DETECTED   Klebsiella oxytoca NOT DETECTED NOT DETECTED   Klebsiella pneumoniae NOT DETECTED NOT DETECTED   Proteus species NOT DETECTED NOT DETECTED   Salmonella species NOT DETECTED NOT DETECTED   Serratia marcescens NOT DETECTED NOT DETECTED   Haemophilus influenzae NOT DETECTED NOT DETECTED   Neisseria meningitidis NOT DETECTED NOT DETECTED    Pseudomonas aeruginosa NOT DETECTED NOT DETECTED   Stenotrophomonas maltophilia NOT DETECTED NOT DETECTED   Candida albicans NOT DETECTED NOT DETECTED   Candida auris NOT DETECTED NOT DETECTED   Candida glabrata NOT DETECTED NOT DETECTED   Candida krusei NOT DETECTED NOT DETECTED   Candida parapsilosis NOT DETECTED NOT DETECTED   Candida tropicalis NOT DETECTED NOT DETECTED   Cryptococcus neoformans/gattii NOT DETECTED NOT DETECTED    Loralee Pacas, PharmD, BCPS Pharmacy: [phone] 07/21/2022  6:01 PM

## 2022-07-21 NOTE — Progress Notes (Signed)
  Echocardiogram 2D Echocardiogram with defintiy has been performed.  Leta Jungling M 07/21/2022, 8:15 AM

## 2022-07-21 NOTE — Progress Notes (Signed)
[name] [id number] 1934-12-30  CARE TEAM:  PCP: Deatra James, MD  Outpatient Care Team: Patient Care Team: Deatra James, MD as PCP - General (Family Medicine)  Inpatient Treatment Team: Treatment Team: Attending Provider: Bishop Limbo, MD; Charge Nurse: Dorris Carnes, RN; Rounding Team: Delmer Islam, MD; Technician: Franky Macho, NT; Registered Nurse: Maretta Los, Marcelino Duster, RN; Registered Nurse: Randel Books, RN; Pharmacist: Pricilla Riffle, [facility]; Utilization Review: Deveron Furlong, RN   Problem List:   Principal Problem:   Acute appendicitis Active Problems:   HTN (hypertension)   Hypothyroidism   Acute encephalopathy   Severe sepsis (HCC)   Acute respiratory failure with hypoxia (HCC)   Acute pulmonary edema (HCC)   1 Day Post-Op  07/20/2022  Preop diagnosis:  Acute appendicitis   Postop diagnosis:  Acute gangrenous perforated appendicitis   Procedure:  Laparoscopic appendectomy   Surgeon:  Darnell Level, MD    Assessment  Stabilizing after urgent surgery.  [facility] Stay = 0 days)  Plan:  -IV antibiotics x5 days minimum.  I would do piperacillin/tazobactam per complicated appendicitis protocol -Clear liquids.  Can advance to dysphagia 1/fulls.  Hold off on being too aggressive since elderly with gangrenous appendix with some evidence of shock = high risk of postoperative ileus. -Internal medicine help appreciated.  Suspect this patient will have somewhat of a prolonged recovery given advanced age and other health issues -VTE prophylaxis- SCDs, chemical prophylaxis  -mobilize as tolerated to help recovery.  Patient's baseline is to walk with a walker at home according to the grandson.  See if we can get physical/occupational therapies to help evaluate with nursing to see what his level of independence/needs are.  Disposition:  Disposition:  The patient is from: Home  Anticipate discharge to:  Home with Home Health  Anticipated Date of  Discharge is:  September 11,2023    Barriers to discharge:  Pending Clinical improvement (more likely than not)  Patient currently is NOT MEDICALLY STABLE for discharge from the hospital from a surgery standpoint.      I reviewed nursing notes, hospitalist notes, last 24 h vitals and pain scores, last 48 h intake and output, last 24 h labs and trends, and last 24 h imaging results. I have reviewed this patient's available data, including medical history, events of note, test results, etc as part of my evaluation.  A significant portion of that time was spent in counseling.  Care during the described time interval was provided by me.  This care required moderate level of medical decision making.  07/21/2022    Subjective: (Chief complaint)  Events noted with confusion and hypoxia.  Transferred to ICU/stepdown unit.  ICU and hospitalist care appreciated.  Grandson at bedside.  Patient denies much pain and feeling much better.  Weaning down to just nasal cannula.  No nausea.  Tolerating some liquids.  Objective:  Vital signs:  Vitals:   07/21/22 0600 07/21/22 0700 07/21/22 0730 07/21/22 0746  BP: (!) 97/51 97/64    Pulse: 94 94 (!) 102   Resp: 15 (!) 25 (!) 23   Temp:    98.1 F (36.7 C)  TempSrc:    Oral  SpO2: 96% 97% 97%   Weight:      Height:        Last BM Date : 07/20/22  Intake/Output   Yesterday:  09/08 0701 - 09/09 0700 In: 2701.5 [I.V.:2401.5; IV Piggyback:300] Out: 5760 [Urine:5750; Blood:10] This shift:  No intake/output data recorded.  Bowel  function:  Flatus: No  BM:  No  Drain: (No drain)   Physical Exam:  General: Pt awake/alert in no acute distress Eyes: PERRL, normal EOM.  Sclera clear.  No icterus Neuro: CN II-XII intact w/o focal sensory/motor deficits. Lymph: No head/neck/groin lymphadenopathy Psych:  No delerium/psychosis/paranoia.  Oriented x 1 HENT: Normocephalic, Mucus membranes moist.  No thrush Neck: Supple, No tracheal  deviation.  No obvious thyromegaly Chest: No pain to chest wall compression.  Good respiratory excursion.  No audible wheezing CV:  Pulses intact.  Regular rhythm.  No major extremity edema MS: Normal AROM mjr joints.  No obvious deformity  Abdomen: Soft.  Mildy distended.  Mildly tender at incisions only.  No evidence of peritonitis.  No incarcerated hernias.  Ext:   No deformity.  No mjr edema.  No cyanosis Skin: No petechiae / purpurea.  No major sores.  Warm and dry    Results:   Cultures: Recent Results (from the past 720 hour(s))  Culture, blood (Routine x 2)     Status: None (Preliminary result)   Collection Time: 07/20/22  9:00 AM   Specimen: BLOOD  Result Value Ref Range Status   Specimen Description   Final    BLOOD RIGHT ANTECUBITAL Performed at [facility], [address] [place], [place] [address]    Special Requests   Final    BOTTLES DRAWN AEROBIC AND ANAEROBIC Blood Culture results may not be optimal due to an excessive volume of blood received in culture bottles Performed at [facility], [address] [place], [place] [address]    Culture   Final    NO GROWTH < 24 HOURS Performed at [facility], [address] [place], [place] [address]    Report Status PENDING  Incomplete  Culture, blood (Routine x 2)     Status: None (Preliminary result)   Collection Time: 07/20/22  9:12 AM   Specimen: BLOOD  Result Value Ref Range Status   Specimen Description   Final    BLOOD LEFT ANTECUBITAL Performed at [facility], [address], [place], [place] [address]    Special Requests   Final    BOTTLES DRAWN AEROBIC AND ANAEROBIC Blood Culture results may not be optimal due to an excessive volume of blood received in culture bottles Performed at [facility], [address] [place], [place] [address]    Culture   Final    NO GROWTH < 24 HOURS Performed at [facility],  [address], [place], [place] [address]    Report Status PENDING  Incomplete  Resp Panel by RT-PCR (Flu A&B, Covid) Anterior Nasal Swab     Status: None   Collection Time: 07/20/22  9:21 AM   Specimen: Anterior Nasal Swab  Result Value Ref Range Status   SARS Coronavirus 2 by RT PCR NEGATIVE NEGATIVE Final    Comment: (NOTE) SARS-CoV-2 target nucleic acids are NOT DETECTED.  The SARS-CoV-2 RNA is generally detectable in upper respiratory specimens during the acute phase of infection. The lowest concentration of SARS-CoV-2 viral copies this assay can detect is 138 copies/mL. A negative result does not preclude SARS-Cov-2 infection and should not be used as the sole basis for treatment or other patient management decisions. A negative result may occur with  improper specimen collection/handling, submission of specimen other than nasopharyngeal swab, presence of viral mutation(s) within the areas targeted by this assay, and inadequate number of viral  copies(<138 copies/mL). A negative result must be combined with clinical observations, patient history, and epidemiological information. The expected result is Negative.  Fact Sheet for Patients:  BloggerCourse.com  Fact Sheet for Healthcare Providers:  SeriousBroker.it  This test is no t yet approved or cleared by the [place] FDA and  has been authorized for detection and/or diagnosis of SARS-CoV-2 by FDA under an Emergency Use Authorization (EUA). This EUA will remain  in effect (meaning this test can be used) for the duration of the COVID-19 declaration under Section 564(b)(1) of the Act, 21 U.S.C.section 360bbb-3(b)(1), unless the authorization is terminated  or revoked sooner.       Influenza A by PCR NEGATIVE NEGATIVE Final   Influenza B by PCR NEGATIVE NEGATIVE Final    Comment: (NOTE) The Xpert Xpress SARS-CoV-2/FLU/RSV plus assay is intended as an aid in the diagnosis  of influenza from Nasopharyngeal swab specimens and should not be used as a sole basis for treatment. Nasal washings and aspirates are unacceptable for Xpert Xpress SARS-CoV-2/FLU/RSV testing.  Fact Sheet for Patients: BloggerCourse.com  Fact Sheet for Healthcare Providers: SeriousBroker.it  This test is not yet approved or cleared by the [place] FDA and has been authorized for detection and/or diagnosis of SARS-CoV-2 by FDA under an Emergency Use Authorization (EUA). This EUA will remain in effect (meaning this test can be used) for the duration of the COVID-19 declaration under Section 564(b)(1) of the Act, 21 U.S.C. section 360bbb-3(b)(1), unless the authorization is terminated or revoked.  Performed at [facility], [address], [place], [place] [address]   MRSA Next Gen by PCR, Nasal     Status: None   Collection Time: 07/20/22  7:40 PM   Specimen: Nasal Mucosa; Nasal Swab  Result Value Ref Range Status   MRSA by PCR Next Gen NOT DETECTED NOT DETECTED Final    Comment: (NOTE) The GeneXpert MRSA Assay (FDA approved for NASAL specimens only), is one component of a comprehensive MRSA colonization surveillance program. It is not intended to diagnose MRSA infection nor to guide or monitor treatment for MRSA infections. Test performance is not FDA approved in patients less than 34 years old. Performed at [facility], [address] [place], [place] [address]     Labs: Results for orders placed or performed during the hospital encounter of 07/20/22 (from the past 48 hour(s))  Urinalysis, Routine w reflex microscopic Urine, Clean Catch     Status: Abnormal   Collection Time: 07/20/22  8:54 AM  Result Value Ref Range   Color, Urine YELLOW YELLOW   APPearance CLEAR CLEAR   Specific Gravity, Urine 1.010 1.005 - 1.030   pH 6.0 5.0 - 8.0   Glucose, UA NEGATIVE NEGATIVE mg/dL   Hgb  urine dipstick NEGATIVE NEGATIVE   Bilirubin Urine NEGATIVE NEGATIVE   Ketones, ur 20 (A) NEGATIVE mg/dL   Protein, ur 30 (A) NEGATIVE mg/dL   Nitrite NEGATIVE NEGATIVE   Leukocytes,Ua NEGATIVE NEGATIVE   RBC / HPF 0-5 0 - 5 RBC/hpf   WBC, UA 0-5 0 - 5 WBC/hpf   Bacteria, UA NONE SEEN NONE SEEN   Mucus PRESENT    Hyaline Casts, UA PRESENT     Comment: Performed at [facility], [address], [place], [place] [address]  Comprehensive metabolic panel     Status: Abnormal   Collection Time: 07/20/22  9:00 AM  Result Value Ref Range   Sodium 136 135 - 145 mmol/L  Potassium 4.4 3.5 - 5.1 mmol/L   Chloride 101 98 - 111 mmol/L   CO2 25 22 - 32 mmol/L   Glucose, Bld 186 (H) 70 - 99 mg/dL    Comment: Glucose reference range applies only to samples taken after fasting for at least 8 hours.   BUN 10 8 - 23 mg/dL   Creatinine, Ser 4.09 0.61 - 1.24 mg/dL   Calcium 9.3 8.9 - 81.1 mg/dL   Total Protein 7.6 6.5 - 8.1 g/dL   Albumin 4.1 3.5 - 5.0 g/dL   AST 26 15 - 41 U/L   ALT 18 0 - 44 U/L   Alkaline Phosphatase 32 (L) 38 - 126 U/L   Total Bilirubin 1.5 (H) 0.3 - 1.2 mg/dL   GFR, Estimated >91 >47 mL/min    Comment: (NOTE) Calculated using the CKD-EPI Creatinine Equation (2021)    Anion gap 10 5 - 15    Comment: Performed at [facility], [address] [place], [place] [address]  CBC with Differential     Status: Abnormal   Collection Time: 07/20/22  9:00 AM  Result Value Ref Range   WBC 12.8 (H) 4.0 - 10.5 K/uL   RBC 4.80 4.22 - 5.81 MIL/uL   Hemoglobin 15.6 13.0 - 17.0 g/dL   HCT 21.3 08.6 - 57.8 %   MCV 95.0 80.0 - 100.0 fL   MCH 32.5 26.0 - 34.0 pg   MCHC 34.2 30.0 - 36.0 g/dL   RDW 46.9 62.9 - 52.8 %   Platelets 172 150 - 400 K/uL   nRBC 0.0 0.0 - 0.2 %   Neutrophils Relative % 83 %   Neutro Abs 10.6 (H) 1.7 - 7.7 K/uL   Lymphocytes Relative 8 %   Lymphs Abs 1.0 0.7 - 4.0 K/uL   Monocytes Relative 9 %   Monocytes Absolute 1.2 (H)  0.1 - 1.0 K/uL   Eosinophils Relative 0 %   Eosinophils Absolute 0.1 0.0 - 0.5 K/uL   Basophils Relative 0 %   Basophils Absolute 0.0 0.0 - 0.1 K/uL   Immature Granulocytes 0 %   Abs Immature Granulocytes 0.05 0.00 - 0.07 K/uL    Comment: Performed at [facility], [address], [place], [place] [address]  Protime-INR     Status: Abnormal   Collection Time: 07/20/22  9:00 AM  Result Value Ref Range   Prothrombin Time 16.5 (H) 11.4 - 15.2 seconds   INR 1.3 (H) 0.8 - 1.2    Comment: (NOTE) INR goal varies based on device and disease states. Performed at [facility], [address] [place], [place] [address]   Culture, blood (Routine x 2)     Status: None (Preliminary result)   Collection Time: 07/20/22  9:00 AM   Specimen: BLOOD  Result Value Ref Range   Specimen Description      BLOOD RIGHT ANTECUBITAL Performed at [facility], [address] [place], [place] [address]    Special Requests      BOTTLES DRAWN AEROBIC AND ANAEROBIC Blood Culture results may not be optimal due to an excessive volume of blood received in culture bottles Performed at [facility], [address], [place], [place] [address]    Culture      NO GROWTH < 24 HOURS Performed at [facility], [address] [place], [place] [address]    Report Status PENDING   APTT     Status:  None   Collection Time: 07/20/22  9:00 AM  Result Value Ref Range   aPTT 32 24 - 36 seconds    Comment: Performed at [facility], [address], [place], [place] [address]  Lactic acid, plasma     Status: Abnormal   Collection Time: 07/20/22  9:05 AM  Result Value Ref Range   Lactic Acid, Venous 3.9 (HH) 0.5 - 1.9 mmol/L    Comment: CRITICAL RESULT CALLED TO, READ BACK BY AND VERIFIED WITH BIENFANG, A RN @ [phone] 07/20/22. GILBERT, L Performed at [facility], [address], [place], [place] [address]    Culture, blood (Routine x 2)     Status: None (Preliminary result)   Collection Time: 07/20/22  9:12 AM   Specimen: BLOOD  Result Value Ref Range   Specimen Description      BLOOD LEFT ANTECUBITAL Performed at [facility], [address], [place], [place] [address]    Special Requests      BOTTLES DRAWN AEROBIC AND ANAEROBIC Blood Culture results may not be optimal due to an excessive volume of blood received in culture bottles Performed at [facility], [address] [place], [place] [address]    Culture      NO GROWTH < 24 HOURS Performed at [facility], [address], [place], [place] [address]    Report Status PENDING   Resp Panel by RT-PCR (Flu A&B, Covid) Anterior Nasal Swab     Status: None   Collection Time: 07/20/22  9:21 AM   Specimen: Anterior Nasal Swab  Result Value Ref Range   SARS Coronavirus 2 by RT PCR NEGATIVE NEGATIVE    Comment: (NOTE) SARS-CoV-2 target nucleic acids are NOT DETECTED.  The SARS-CoV-2 RNA is generally detectable in upper respiratory specimens during the acute phase of infection. The lowest concentration of SARS-CoV-2 viral copies this assay can detect is 138 copies/mL. A negative result does not preclude SARS-Cov-2 infection and should not be used as the sole basis for treatment or other patient management decisions. A negative result may occur with  improper specimen collection/handling, submission of specimen other than nasopharyngeal swab, presence of viral mutation(s) within the areas targeted by this assay, and inadequate number of viral copies(<138 copies/mL). A negative result must be combined with clinical observations, patient history, and epidemiological information. The expected result is Negative.  Fact Sheet for Patients:  BloggerCourse.com  Fact Sheet for Healthcare Providers:  SeriousBroker.it  This test is no t yet approved  or cleared by the [place] FDA and  has been authorized for detection and/or diagnosis of SARS-CoV-2 by FDA under an Emergency Use Authorization (EUA). This EUA will remain  in effect (meaning this test can be used) for the duration of the COVID-19 declaration under Section 564(b)(1) of the Act, 21 U.S.C.section 360bbb-3(b)(1), unless the authorization is terminated  or revoked sooner.       Influenza A by PCR NEGATIVE NEGATIVE   Influenza B by PCR NEGATIVE NEGATIVE    Comment: (NOTE) The Xpert Xpress SARS-CoV-2/FLU/RSV plus assay is intended as an aid in the diagnosis of influenza from Nasopharyngeal swab specimens and should not be used as a sole basis for treatment. Nasal washings and aspirates are unacceptable for Xpert Xpress SARS-CoV-2/FLU/RSV testing.  Fact Sheet for Patients: BloggerCourse.com  Fact Sheet for Healthcare Providers: SeriousBroker.it  This test is not yet approved or cleared by the [place] and has been authorized for  detection and/or diagnosis of SARS-CoV-2 by FDA under an Emergency Use Authorization (EUA). This EUA will remain in effect (meaning this test can be used) for the duration of the COVID-19 declaration under Section 564(b)(1) of the Act, 21 U.S.C. section 360bbb-3(b)(1), unless the authorization is terminated or revoked.  Performed at [facility], [address] [place], [place] [address]   CBG monitoring, ED     Status: Abnormal   Collection Time: 07/20/22  9:24 AM  Result Value Ref Range   Glucose-Capillary 174 (H) 70 - 99 mg/dL    Comment: Glucose reference range applies only to samples taken after fasting for at least 8 hours.  Lactic acid, plasma     Status: Abnormal   Collection Time: 07/20/22 11:44 AM  Result Value Ref Range   Lactic Acid, Venous 3.5 (HH) 0.5 - 1.9 mmol/L    Comment: CRITICAL VALUE NOTED. VALUE IS CONSISTENT WITH PREVIOUSLY  REPORTED/CALLED VALUE Performed at [facility], [address], [place], [place] [address]   MRSA Next Gen by PCR, Nasal     Status: None   Collection Time: 07/20/22  7:40 PM   Specimen: Nasal Mucosa; Nasal Swab  Result Value Ref Range   MRSA by PCR Next Gen NOT DETECTED NOT DETECTED    Comment: (NOTE) The GeneXpert MRSA Assay (FDA approved for NASAL specimens only), is one component of a comprehensive MRSA colonization surveillance program. It is not intended to diagnose MRSA infection nor to guide or monitor treatment for MRSA infections. Test performance is not FDA approved in patients less than 84 years old. Performed at [facility], [address] [place], [place] [address]   Lactic acid, plasma     Status: Abnormal   Collection Time: 07/20/22  8:38 PM  Result Value Ref Range   Lactic Acid, Venous 4.5 (HH) 0.5 - 1.9 mmol/L    Comment: CRITICAL VALUE NOTED. VALUE IS CONSISTENT WITH PREVIOUSLY REPORTED/CALLED VALUE Performed at [facility], [address], [place], [place] [address]   Brain natriuretic peptide     Status: Abnormal   Collection Time: 07/20/22  8:38 PM  Result Value Ref Range   B Natriuretic Peptide 195.5 (H) 0.0 - 100.0 pg/mL    Comment: Performed at [facility], [address], [place], [place] [address]  TSH     Status: None   Collection Time: 07/20/22  8:38 PM  Result Value Ref Range   TSH 1.653 0.350 - 4.500 uIU/mL    Comment: Performed by a 3rd Generation assay with a functional sensitivity of <=0.01 uIU/mL. Performed at [facility], [address], [place], [place] [address]   Ammonia     Status: Abnormal   Collection Time: 07/20/22  8:38 PM  Result Value Ref Range   Ammonia 36 (H) 9 - 35 umol/L    Comment: Performed at [facility], [address], [place], [place] [address]  Vitamin B12     Status: None   Collection Time: 07/20/22   8:38 PM  Result Value Ref Range   Vitamin B-12 272 180 - 914 pg/mL    Comment: (NOTE) This assay is not validated for testing neonatal or myeloproliferative syndrome specimens for Vitamin B12 levels. Performed at [facility], [address], [place], [place] [address]     Imaging / Studies: X-ray chest PA or AP  Result Date: 07/20/2022 CLINICAL DATA:  Shortness of breath following appendectomy  EXAM: CHEST  1 VIEW COMPARISON:  07/20/2022 at 0931 hours FINDINGS: Stable cardiomediastinal contours. Low lung volumes. Increasing interstitial opacities throughout both lungs. No large pleural fluid collection. No pneumothorax. IMPRESSION: Increasing interstitial opacities throughout both lungs may reflect pulmonary edema. Electronically Signed   By: Duanne Guess D.O.   On: 07/20/2022 18:35   CT Abdomen Pelvis W Contrast  Result Date: 07/20/2022 CLINICAL DATA:  Right lower quadrant pain EXAM: CT ABDOMEN AND PELVIS WITH CONTRAST TECHNIQUE: Multidetector CT imaging of the abdomen and pelvis was performed using the standard protocol following bolus administration of intravenous contrast. RADIATION DOSE REDUCTION: This exam was performed according to the departmental dose-optimization program which includes automated exposure control, adjustment of the mA and/or kV according to patient size and/or use of iterative reconstruction technique. CONTRAST:  OMNIPAQUE IOHEXOL 300 MG/ML  SOLN COMPARISON:  08/26/2020 FINDINGS: Lower chest: Tortuosity of the descending thoracic aorta. Coronary artery atherosclerosis. Hepatobiliary: Mildly decreased attenuation of the hepatic parenchyma. No focal liver lesion is identified. Unremarkable gallbladder. No hyperdense gallstone. No biliary dilatation. Pancreas: Unremarkable. No pancreatic ductal dilatation or surrounding inflammatory changes. Spleen: Normal in size without focal abnormality. Adrenals/Urinary Tract: Adrenal glands are unremarkable.  Kidneys are normal, without renal calculi, solid lesion, or hydronephrosis. Mild urinary bladder wall thickening. The visualized urethra is moderately distended with fluid along its full length with urothelial enhancement (series 6, image 75). Stomach/Bowel: The appendix is abnormally dilated and hyperenhancing with surrounding periappendiceal fat stranding and trace free fluid. No organized periappendiceal fluid collection. No extraluminal air. No dilated loops of bowel to suggest obstruction. Stomach within normal limits. No additional sites of focal bowel inflammation. Vascular/Lymphatic: Scattered aortoiliac atherosclerotic calcifications without aneurysm. No abdominopelvic lymphadenopathy. Reproductive: Prostate is unremarkable. Other: No ascites. No organized abdominopelvic fluid collection. No pneumoperitoneum. No abdominal wall hernia. 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 No new or acute bony findings. IMPRESSION: 1. Acute uncomplicated appendicitis. 2. Mild urinary bladder wall thickening. The visualized urethra is moderately distended with diffuse urothelial enhancement. Findings suspicious for urethritis and cystitis. Correlate with urinalysis. The possibility of a distal urethral stricture is also a consideration. 3. Hepatic steatosis. 4. Aortic and coronary artery atherosclerosis (ICD10-I70.0). Electronically Signed   By: Duanne Guess D.O.   On: 07/20/2022 11:02   CT Head Wo Contrast  Result Date: 07/20/2022 CLINICAL DATA:  Status post fall. EXAM: CT HEAD WITHOUT CONTRAST CT CERVICAL SPINE WITHOUT CONTRAST TECHNIQUE: Multidetector CT imaging of the head and cervical spine was performed following the standard protocol without intravenous contrast. Multiplanar CT image reconstructions of the cervical spine were also generated. RADIATION DOSE REDUCTION: This exam was performed according to the departmental dose-optimization  program which includes automated exposure control, adjustment of the mA and/or kV according to patient size and/or use of iterative reconstruction technique. COMPARISON:  02/15/2015 head CT. FINDINGS: CT HEAD FINDINGS Brain: There is no evidence for acute hemorrhage, hydrocephalus, mass lesion, or abnormal extra-axial fluid collection. No definite CT evidence for acute infarction. Diffuse loss of parenchymal volume is consistent with atrophy. Patchy low attenuation in the deep hemispheric and periventricular white matter is nonspecific, but likely reflects chronic microvascular ischemic demyelination. Vascular: No hyperdense vessel or unexpected calcification. Skull: No evidence for fracture. No worrisome lytic or sclerotic lesion. Right-sided burr holes evident. Sinuses/Orbits: The visualized paranasal sinuses and mastoid air cells are clear. Visualized portions of the globes and intraorbital fat are unremarkable. Other: None. CT CERVICAL SPINE FINDINGS Alignment:  Trace anterolisthesis of C4 on 5 and C5 on 6 is compatible with a degree of facet disease at those levels. No evidence for traumatic subluxation. Skull base and vertebrae: No acute fracture. No primary bone lesion or focal pathologic process. Soft tissues and spinal canal: No prevertebral fluid or swelling. No visible canal hematoma. Disc levels: Mild loss of disc height noted C6-7 and C7-T1. Diffuse facet osteoarthritis noted bilaterally. Upper chest: Centrilobular emphysema. Other: None. IMPRESSION: 1. No acute intracranial abnormality. 2. Atrophy with chronic small vessel white matter ischemic disease. 3. No evidence for cervical spine fracture or traumatic subluxation. 4. Degenerative changes in the cervical spine as above. Electronically Signed   By: Kennith Center M.D.   On: 07/20/2022 10:52   CT Cervical Spine Wo Contrast  Result Date: 07/20/2022 CLINICAL DATA:  Status post fall. EXAM: CT HEAD WITHOUT CONTRAST CT CERVICAL SPINE WITHOUT CONTRAST  TECHNIQUE: Multidetector CT imaging of the head and cervical spine was performed following the standard protocol without intravenous contrast. Multiplanar CT image reconstructions of the cervical spine were also generated. RADIATION DOSE REDUCTION: This exam was performed according to the departmental dose-optimization program which includes automated exposure control, adjustment of the mA and/or kV according to patient size and/or use of iterative reconstruction technique. COMPARISON:  02/15/2015 head CT. FINDINGS: CT HEAD FINDINGS Brain: There is no evidence for acute hemorrhage, hydrocephalus, mass lesion, or abnormal extra-axial fluid collection. No definite CT evidence for acute infarction. Diffuse loss of parenchymal volume is consistent with atrophy. Patchy low attenuation in the deep hemispheric and periventricular white matter is nonspecific, but likely reflects chronic microvascular ischemic demyelination. Vascular: No hyperdense vessel or unexpected calcification. Skull: No evidence for fracture. No worrisome lytic or sclerotic lesion. Right-sided burr holes evident. Sinuses/Orbits: The visualized paranasal sinuses and mastoid air cells are clear. Visualized portions of the globes and intraorbital fat are unremarkable. Other: None. CT CERVICAL SPINE FINDINGS Alignment: Trace anterolisthesis of C4 on 5 and C5 on 6 is compatible with a degree of facet disease at those levels. No evidence for traumatic subluxation. Skull base and vertebrae: No acute fracture. No primary bone lesion or focal pathologic process. Soft tissues and spinal canal: No prevertebral fluid or swelling. No visible canal hematoma. Disc levels: Mild loss of disc height noted C6-7 and C7-T1. Diffuse facet osteoarthritis noted bilaterally. Upper chest: Centrilobular emphysema. Other: None. IMPRESSION: 1. No acute intracranial abnormality. 2. Atrophy with chronic small vessel white matter ischemic disease. 3. No evidence for cervical spine  fracture or traumatic subluxation. 4. Degenerative changes in the cervical spine as above. Electronically Signed   By: Kennith Center M.D.   On: 07/20/2022 10:52   DG Chest 2 View  Result Date: 07/20/2022 CLINICAL DATA:  Suspected sepsis EXAM: CHEST - 2 VIEW COMPARISON:  Chest x-ray dated December 24, 2010 FINDINGS: Cardiac and mediastinal contours are within normal limits for technique. Low lung volumes with hypoventilatory changes. No definite focal consolidation. No large pleural effusion or pneumothorax. IMPRESSION: Low lung volumes with hypoventilatory changes. No definite focal consolidation. Electronically Signed   By: Allegra Lai M.D.   On: 07/20/2022 09:45    Medications / Allergies: per chart  Antibiotics: Anti-infectives (From admission, onward)    Start     Dose/Rate Route Frequency Ordered Stop   07/20/22 2030  ceFEPIme (MAXIPIME) 2 g in sodium chloride 0.9 % 100 mL IVPB       See Hyperspace for full Linked Orders Report.   2 g 200 mL/hr over  30 Minutes Intravenous Every 8 hours 07/20/22 1938 07/25/22 2159   07/20/22 2000  metroNIDAZOLE (FLAGYL) IVPB 500 mg       See Hyperspace for full Linked Orders Report.   500 mg 100 mL/hr over 60 Minutes Intravenous Every 12 hours 07/20/22 1938 07/25/22 1959   07/20/22 1145  metroNIDAZOLE (FLAGYL) IVPB 500 mg        500 mg 100 mL/hr over 60 Minutes Intravenous  Once 07/20/22 1136 07/20/22 1243   07/20/22 1015  cefTRIAXone (ROCEPHIN) 2 g in sodium chloride 0.9 % 100 mL IVPB  Status:  Discontinued        2 g 200 mL/hr over 30 Minutes Intravenous  Once 07/20/22 1000 07/20/22 1007   07/20/22 1015  cefTRIAXone (ROCEPHIN) 1 g in sodium chloride 0.9 % 100 mL IVPB  Status:  Discontinued        1 g 200 mL/hr over 30 Minutes Intravenous Every hour 07/20/22 1008 07/20/22 1214   07/20/22 1000  cefTRIAXone (ROCEPHIN) 1 g in sodium chloride 0.9 % 100 mL IVPB  Status:  Discontinued        1 g 200 mL/hr over 30 Minutes Intravenous  Once 07/20/22  0950 07/20/22 1037   07/20/22 0945  ceFEPIme (MAXIPIME) 2 g in sodium chloride 0.9 % 100 mL IVPB  Status:  Discontinued        2 g 200 mL/hr over 30 Minutes Intravenous  Once 07/20/22 0943 07/20/22 0950   07/20/22 0945  vancomycin (VANCOCIN) IVPB 1000 mg/200 mL premix  Status:  Discontinued        1,000 mg 200 mL/hr over 60 Minutes Intravenous  Once 07/20/22 [id number] 07/20/22 0950         Note: Portions of this report may have been transcribed using voice recognition software. Every effort was made to ensure accuracy; however, inadvertent computerized transcription errors may be present.   Any transcriptional errors that result from this process are unintentional.    Ardeth SportsmanSteven C. Kamiyah Kindel, MD, FACS, MASCRS Esophageal, Gastrointestinal & Colorectal Surgery Robotic and Minimally Invasive Surgery  [facility] [address] [place], [place] [id number] [phone] Fax [phone] Main  CONTACT INFORMATION:  Weekday (9AM-5PM): Call [facility] main office at [phone]  Weeknight (5PM-9AM) or Weekend/Holiday: Check [email or web address] (password " [email or web address]") for General Surgery [facility] coverage  (Please, do not use SecureChat as it is not reliable communication to reach operating surgeons for immediate patient care)      07/21/2022  8:31 AM

## 2022-07-22 DIAGNOSIS — K35891 Other acute appendicitis without perforation, with gangrene: Secondary | ICD-10-CM | POA: Diagnosis not present

## 2022-07-22 DIAGNOSIS — G9340 Encephalopathy, unspecified: Secondary | ICD-10-CM | POA: Diagnosis not present

## 2022-07-22 DIAGNOSIS — J9601 Acute respiratory failure with hypoxia: Secondary | ICD-10-CM | POA: Diagnosis not present

## 2022-07-22 LAB — CBC WITH DIFFERENTIAL/PLATELET
Abs Immature Granulocytes: 0.08 10*3/uL — ABNORMAL HIGH (ref 0.00–0.07)
Basophils Absolute: 0 10*3/uL (ref 0.0–0.1)
Basophils Relative: 0 %
Eosinophils Absolute: 0 10*3/uL (ref 0.0–0.5)
Eosinophils Relative: 0 %
HCT: 40.9 % (ref 39.0–52.0)
Hemoglobin: 13.5 g/dL (ref 13.0–17.0)
Immature Granulocytes: 1 %
Lymphocytes Relative: 15 %
Lymphs Abs: 2.1 10*3/uL (ref 0.7–4.0)
MCH: 32.5 pg (ref 26.0–34.0)
MCHC: 33 g/dL (ref 30.0–36.0)
MCV: 98.3 fL (ref 80.0–100.0)
Monocytes Absolute: 1.2 10*3/uL — ABNORMAL HIGH (ref 0.1–1.0)
Monocytes Relative: 8 %
Neutro Abs: 10.8 10*3/uL — ABNORMAL HIGH (ref 1.7–7.7)
Neutrophils Relative %: 76 %
Platelets: 176 10*3/uL (ref 150–400)
RBC: 4.16 MIL/uL — ABNORMAL LOW (ref 4.22–5.81)
RDW: 12.8 % (ref 11.5–15.5)
WBC: 14.2 10*3/uL — ABNORMAL HIGH (ref 4.0–10.5)
nRBC: 0 % (ref 0.0–0.2)

## 2022-07-22 LAB — BASIC METABOLIC PANEL
Anion gap: 8 (ref 5–15)
BUN: 23 mg/dL (ref 8–23)
CO2: 29 mmol/L (ref 22–32)
Calcium: 8.4 mg/dL — ABNORMAL LOW (ref 8.9–10.3)
Chloride: 102 mmol/L (ref 98–111)
Creatinine, Ser: 0.91 mg/dL (ref 0.61–1.24)
GFR, Estimated: >60 mL/min (ref >60)
Glucose, Bld: 145 mg/dL — ABNORMAL HIGH (ref 70–99)
Potassium: 3.6 mmol/L (ref 3.5–5.1)
Sodium: 139 mmol/L (ref 135–145)

## 2022-07-22 MED ORDER — ADULT MULTIVITAMIN W/MINERALS CH
1.0000 | ORAL_TABLET | Freq: Every day | ORAL | Status: DC
  Administered 2022-07-22 – 2022-07-26 (×5): 1 via ORAL
  Filled 2022-07-22 (×5): qty 1

## 2022-07-22 MED ORDER — ASPIRIN 81 MG PO TBEC
81.0000 mg | DELAYED_RELEASE_TABLET | Freq: Every morning | ORAL | Status: DC
  Administered 2022-07-22 – 2022-07-26 (×5): 81 mg via ORAL
  Filled 2022-07-22 (×5): qty 1

## 2022-07-22 MED ORDER — ACETAMINOPHEN 500 MG PO TABS
1000.0000 mg | ORAL_TABLET | Freq: Three times a day (TID) | ORAL | Status: DC
  Administered 2022-07-22 – 2022-07-31 (×22): 1000 mg via ORAL
  Filled 2022-07-22 (×22): qty 2

## 2022-07-22 NOTE — Evaluation (Addendum)
Physical Therapy Evaluation Patient Details Name: [name] MRN: [id number] DOB: 03-13-1935 Today's Date: 07/22/2022  History of Present Illness  87 yo male admitted with acute appendicitis s/p appendectomy 9/8, acute respiratory failure, sepsis. Hx of SDH s/p evacuation 2016, hypothyroidism, falls, ataxia.  Clinical Impression  On eval, pt required Min A +2 for mobility. He was able to pivot from recliner back to bed with RW-very unsafe. He refused to ambulate despite encouragement. Remained on [place] O2 during session-fluctuating O2 sat reading-90% end of session once back in bed. Family was present and attempted to assist with translation-very limited. Will recommend HHPT f/u and possibly a RW for ambulation safety, depending on progress.        Recommendations for follow up therapy are one component of a multi-disciplinary discharge planning process, led by the attending physician.  Recommendations may be updated based on patient status, additional functional criteria and insurance authorization.  Follow Up Recommendations Home health PT      Assistance Recommended at Discharge Frequent or constant Supervision/Assistance  Patient can return home with the following  A little help with walking and/or transfers;A little help with bathing/dressing/bathroom    Equipment Recommendations None  Recommendations for Other Services       Functional Status Assessment Patient has had a recent decline in their functional status and demonstrates the ability to make significant improvements in function in a reasonable and predictable amount of time.     Precautions / Restrictions Precautions Precautions: Fall Precaution Comments: monitor O2 Restrictions Weight Bearing Restrictions: No      Mobility  Bed Mobility Overal bed mobility: Needs Assistance Bed Mobility: Sit to Supine       Sit to supine: Min assist   General bed mobility comments: Assist for turnk and LEs. Increased time.  Cues provided.    Transfers Overall transfer level: Needs assistance Equipment used: Rolling walker (2 wheels) Transfers: Sit to/from Stand Sit to Stand: Min assist, +2 safety/equipment   Step pivot transfers: Min assist, +2 safety/equipment       General transfer comment: Able to take a couple of steps to bed from recliner with walker. Assist to manage RW. Fatigued quickly as he began to push walker too far forward and leaving his feet behind. Fluctuating O2 sat reading-remained on [place] O2 during session.    Ambulation/Gait               General Gait Details: Pt refused ambulation despite encouragement  Stairs            Wheelchair Mobility    Modified Rankin (Stroke Patients Only)       Balance Overall balance assessment: Needs assistance         Standing balance support: Reliant on assistive device for balance, During functional activity, Bilateral upper extremity supported Standing balance-Leahy Scale: Poor                               Pertinent Vitals/Pain Pain Assessment Pain Assessment: Faces Pain Location: abdomen Pain Descriptors / Indicators: Grimacing Pain Intervention(s): Limited activity within patient's tolerance, Monitored during session, Repositioned    Home Living Family/patient expects to be discharged to:: Private residence Living Arrangements: Spouse/significant other Available Help at Discharge: Available 24 hours/day;Family (wife 24/7 assistance and other family members intermittently in and out during the day) Type of Home: House Home Access: Level entry       Home Layout: One level Home Equipment: Rolling  Walker (2 wheels)      Prior Function Prior Level of Function : Independent/Modified Independent             Mobility Comments: uses a walker ADLs Comments: independent with ADLs     Hand Dominance   Dominant Hand: Right    Extremity/Trunk Assessment   Upper Extremity Assessment Upper  Extremity Assessment: Defer to OT evaluation    Lower Extremity Assessment Lower Extremity Assessment: Generalized weakness    Cervical / Trunk Assessment Cervical / Trunk Assessment: Normal  Communication   Communication: Prefers language other than English  Cognition Arousal/Alertness: Awake/alert Behavior During Therapy: WFL for tasks assessed/performed Overall Cognitive Status: Difficult to assess                                 General Comments: Hx of memory deficits per daughter        General Comments      Exercises     Assessment/Plan    PT Assessment Patient needs continued PT services  PT Problem List Decreased strength;Decreased mobility;Decreased activity tolerance;Decreased balance;Pain;Decreased knowledge of use of DME       PT Treatment Interventions DME instruction;Gait training;Therapeutic activities;Therapeutic exercise;Patient/family education;Balance training;Functional mobility training    PT Goals (Current goals can be found in the Care Plan section)  Acute Rehab PT Goals Patient Stated Goal: none stated PT Goal Formulation: With patient/family Time For Goal Achievement: 08/05/22 Potential to Achieve Goals: Good    Frequency Min 3X/week     Co-evaluation               AM-PAC PT "6 Clicks" Mobility  Outcome Measure Help needed turning from your back to your side while in a flat bed without using bedrails?: A Little Help needed moving from lying on your back to sitting on the side of a flat bed without using bedrails?: A Little Help needed moving to and from a bed to a chair (including a wheelchair)?: A Little Help needed standing up from a chair using your arms (e.g., wheelchair or bedside chair)?: A Little Help needed to walk in hospital room?: A Lot Help needed climbing 3-5 steps with a railing? : A Lot 6 Click Score: 16    End of Session Equipment Utilized During Treatment: Oxygen Activity Tolerance: Patient  limited by fatigue Patient left: in bed;with call bell/phone within reach;with family/visitor present;with bed alarm set   PT Visit Diagnosis: Muscle weakness (generalized) (M62.81);Difficulty in walking, not elsewhere classified (R26.2);Pain    Time: 1351-1406 PT Time Calculation (min) (ACUTE ONLY): 15 min   Charges:   PT Evaluation $PT Eval Moderate Complexity: 1 Mod             Faye Ramsay, PT Acute Rehabilitation  Office: [phone] Pager: [phone]

## 2022-07-22 NOTE — Progress Notes (Signed)
PROGRESS NOTE    [name]  [id number] DOB: [birth year] DOA: 07/20/2022 PCP: Donald Prose, MD    Chief Complaint  Patient presents with   Altered Mental Status   Urinary Frequency    Brief Narrative:    [name] is a pleasant 87 y.o. male with past medical history of hypertension, hypothyroidism, hyperlipidemia, nephrolithiasis, and SDH s/p evacuation in 2016, now presenting to the emergency department with lethargy, confusion, and fall. He had a fall overnight while trying to use the bathroom, was confused and unable to get up on his own. His wife was unable to help him up from the floor and called EMS.  He was found to be saturating in the 80s on room air, was placed on nonrebreather, and brought into the ED. Patient was admitted by surgery and taken to the OR  on 9/8 for laparoscopic appendectomy where he was found to have acute gangrenous perforated appendicitis.  He emerged from anesthesia hypertensive, tachycardic, wheezing, and hypoxic.  He had a chest x-ray concerning for developing pulmonary edema, was given 40 mg IV Lasix, Xopenex, and transferred to the ICU.   Patient seen this morning. He is off oxygen and in good spirits. Reports some abdominal pain.   Assessment & Plan:   Principal Problem:   Acute gangrenous appendicitis s/p lap appendectomy 07/20/2022 Active Problems:   Ataxia   Hypertension   Hyperlipidemia   Hypothyroidism   Acute encephalopathy   Severe sepsis ([place])   Acute respiratory failure with hypoxia ([facility])   Acute pulmonary edema ([place])   Non-English speaking patient   History of subdural hematoma   History of fall   Acute appendicitis  Acute respiratory failure with hypoxia initially requiring 10 lit of [place] oxygen Appears to have much improved.  Repeated CXR on 9/9 , with some congestion.  2 doses of lasix given.   Echocardiogram showed preserved LVEF.    Acute metabolic encephalopathy:  Patient had increased fatigue and was less  verbal for ~1 wk before become delirious lethargic last night  TSH wnl, b12 levels low normal .  UA Is negative. RPR is Negative. Initial CT head does not show any acute intracranial abnormality.  Except the language barrier, he has answered simple questions.     Hypertension:  Bp parameters are optimal.     Hypothyroidism;  Resume synthroid.    Sepsis secondary to perforated gangrenous appendicitis S/p cholecystectomy. Sepsis physiology has resolved. BP parameters have normalized. He is weaned off oxygen. He is good to be transferred to med surg later today .  Currently on broad spectrum IV antibiotics. Continue the same as per recommendations from surgery.  Gen surgery on board and primary.  Improving leukocytosis.  Will get dietary consult.  Would benefit from physical therapy .    Bilateral foot pain: going on for many years.  Suspect neuropathy.  Would wait until he is back to baseline before starting him on Neurontin.     Staphylococcus species in 1 out of 4 blood culture bottles Likely a contaminant.  Patient already on zosyn.  Monitor.     Antimicrobials:  Antibiotics Given (last 72 hours)     Date/Time Action Medication Dose Rate   07/20/22 1007 New Bag/Given   cefTRIAXone (ROCEPHIN) 1 g in sodium chloride 0.9 % 100 mL IVPB 1 g 200 mL/hr   07/20/22 1136 New Bag/Given   cefTRIAXone (ROCEPHIN) 1 g in sodium chloride 0.9 % 100 mL IVPB 1 g 200 mL/hr   07/20/22 1143  New Bag/Given   metroNIDAZOLE (FLAGYL) IVPB 500 mg 500 mg 100 mL/hr   07/20/22 2019 New Bag/Given   ceFEPIme (MAXIPIME) 2 g in sodium chloride 0.9 % 100 mL IVPB 2 g 200 mL/hr   07/20/22 2055 New Bag/Given   metroNIDAZOLE (FLAGYL) IVPB 500 mg 500 mg 100 mL/hr   07/21/22 0510 New Bag/Given   ceFEPIme (MAXIPIME) 2 g in sodium chloride 0.9 % 100 mL IVPB 2 g 200 mL/hr   07/21/22 0855 New Bag/Given   piperacillin-tazobactam (ZOSYN) IVPB 3.375 g 3.375 g 12.5 mL/hr   07/21/22 1432 New Bag/Given    piperacillin-tazobactam (ZOSYN) IVPB 3.375 g 3.375 g 12.5 mL/hr   07/21/22 2222 New Bag/Given   piperacillin-tazobactam (ZOSYN) IVPB 3.375 g 3.375 g 12.5 mL/hr   07/22/22 [id number] New Bag/Given   piperacillin-tazobactam (ZOSYN) IVPB 3.375 g 3.375 g 12.5 mL/hr         Subjective: Slow speech but able to answer simple questions.  No chest pain or sob.  Some abdominal pain.  No vomiting.   Objective: Vitals:   07/22/22 0500 07/22/22 0600 07/22/22 0700 07/22/22 0800  BP: (!) 103/52 113/61 120/86 116/75  Pulse: 76 79 85 81  Resp:    16  Temp:      TempSrc:      SpO2: 91% 90% 90% 95%  Weight:      Height:        Intake/Output Summary (Last 24 hours) at 07/22/2022 0940 Last data filed at 07/22/2022 0700 Gross per 24 hour  Intake 189.07 ml  Output 600 ml  Net -410.93 ml    Filed Weights   07/20/22 0847 07/20/22 2015  Weight: 77.1 kg 73.3 kg    Examination:  General exam: Appears calm and comfortable  Respiratory system: Clear to auscultation. Respiratory effort normal. Cardiovascular system: S1 & S2 heard, RRR. No JVD, No pedal edema. Gastrointestinal system: Abdomen is soft, mild gen tenderness, no distention.  Central nervous system: Alert and oriented. No focal neurological deficits. Extremities: Symmetric 5 x 5 power. Skin: No rashes, lesions or ulcers Psychiatry: Mood & affect appropriate.      Data Reviewed: I have personally reviewed following labs and imaging studies  CBC: Recent Labs  Lab 07/20/22 0900 07/21/22 1041 07/22/22 0248  WBC 12.8* 15.0* 14.2*  NEUTROABS 10.6* 12.3* 10.8*  HGB 15.6 14.8 13.5  HCT 45.6 43.7 40.9  MCV 95.0 97.3 98.3  PLT 172 160 176     Basic Metabolic Panel: Recent Labs  Lab 07/20/22 0900 07/21/22 1041 07/22/22 0248  NA 136 139 139  K 4.4 4.3 3.6  CL 101 102 102  CO2 25 29 29   GLUCOSE 186* 170* 145*  BUN 10 15 23   CREATININE 0.79 0.88 0.91  CALCIUM 9.3 8.8* 8.4*     GFR: Estimated Creatinine Clearance: 53.3  mL/min (by C-G formula based on SCr of 0.91 mg/dL).  Liver Function Tests: Recent Labs  Lab 07/20/22 0900 07/21/22 1041  AST 26 44*  ALT 18 22  ALKPHOS 32* 28*  BILITOT 1.5* 1.5*  PROT 7.6 7.6  ALBUMIN 4.1 3.8     CBG: Recent Labs  Lab 07/20/22 0924  GLUCAP 174*      Recent Results (from the past 240 hour(s))  Culture, blood (Routine x 2)     Status: None (Preliminary result)   Collection Time: 07/20/22  9:00 AM   Specimen: BLOOD  Result Value Ref Range Status   Specimen Description   Final    BLOOD  RIGHT ANTECUBITAL Performed at [facility] [address] [place], [place] [address]    Special Requests   Final    BOTTLES DRAWN AEROBIC AND ANAEROBIC Blood Culture results may not be optimal due to an excessive volume of blood received in culture bottles Performed at [facility] [address], [place], [place] [address]    Culture   Final    NO GROWTH 2 DAYS Performed at [facility] [address], [place], [place] [address]    Report Status PENDING  Incomplete  Urine Culture     Status: None   Collection Time: 07/20/22  9:00 AM   Specimen: In/Out Cath Urine  Result Value Ref Range Status   Specimen Description   Final    IN/OUT CATH URINE Performed at [facility] [address], [place], [place] [address]    Special Requests   Final    NONE Performed at [facility], [place] [address] [place], [place] [address]    Culture   Final    NO GROWTH Performed at [facility], [place] [address] [place], [place] [address]    Report Status 07/21/2022 FINAL  Final  Culture, blood (Routine x 2)     Status: None (Preliminary result)   Collection Time: 07/20/22  9:12 AM   Specimen: BLOOD  Result Value Ref Range Status   Specimen Description   Final    BLOOD LEFT ANTECUBITAL Performed at [facility] [address], [place], [place] [address]    Special  Requests   Final    BOTTLES DRAWN AEROBIC AND ANAEROBIC Blood Culture results may not be optimal due to an excessive volume of blood received in culture bottles Performed at [facility] [address], [place], [place] [address]    Culture  Setup Time   Final    GRAM POSITIVE COCCI IN CLUSTERS ANAEROBIC BOTTLE ONLY CRITICAL RESULT CALLED TO, READ BACK BY AND VERIFIED WITH: PHARMD E Maricela Bo [id number] AT 1752 BY CM    Culture   Final    GRAM POSITIVE COCCI IDENTIFICATION TO FOLLOW Performed at [facility], [place] [address] [place], [place] [address]    Report Status PENDING  Incomplete  Blood Culture ID Panel (Reflexed)     Status: Abnormal   Collection Time: 07/20/22  9:12 AM  Result Value Ref Range Status   Enterococcus faecalis NOT DETECTED NOT DETECTED Final   Enterococcus Faecium NOT DETECTED NOT DETECTED Final   Listeria monocytogenes NOT DETECTED NOT DETECTED Final   Staphylococcus species DETECTED (A) NOT DETECTED Final    Comment: CRITICAL RESULT CALLED TO, READ BACK BY AND VERIFIED WITH: PHARMD E WILLIAMSON [id number] AT 1752 BY CM    Staphylococcus aureus (BCID) NOT DETECTED NOT DETECTED Final   Staphylococcus epidermidis NOT DETECTED NOT DETECTED Final   Staphylococcus lugdunensis NOT DETECTED NOT DETECTED Final   Streptococcus species NOT DETECTED NOT DETECTED Final   Streptococcus agalactiae NOT DETECTED NOT DETECTED Final   Streptococcus pneumoniae NOT DETECTED NOT DETECTED Final   Streptococcus pyogenes NOT DETECTED NOT DETECTED Final   A.calcoaceticus-baumannii NOT DETECTED NOT DETECTED Final   Bacteroides fragilis NOT DETECTED NOT DETECTED Final   Enterobacterales NOT DETECTED NOT DETECTED Final   Enterobacter cloacae complex NOT DETECTED NOT DETECTED Final   Escherichia coli NOT DETECTED NOT DETECTED Final   Klebsiella aerogenes NOT DETECTED NOT DETECTED Final   Klebsiella oxytoca NOT  DETECTED NOT DETECTED Final   Klebsiella pneumoniae  NOT DETECTED NOT DETECTED Final   Proteus species NOT DETECTED NOT DETECTED Final   Salmonella species NOT DETECTED NOT DETECTED Final   Serratia marcescens NOT DETECTED NOT DETECTED Final   Haemophilus influenzae NOT DETECTED NOT DETECTED Final   Neisseria meningitidis NOT DETECTED NOT DETECTED Final   Pseudomonas aeruginosa NOT DETECTED NOT DETECTED Final   Stenotrophomonas maltophilia NOT DETECTED NOT DETECTED Final   Candida albicans NOT DETECTED NOT DETECTED Final   Candida auris NOT DETECTED NOT DETECTED Final   Candida glabrata NOT DETECTED NOT DETECTED Final   Candida krusei NOT DETECTED NOT DETECTED Final   Candida parapsilosis NOT DETECTED NOT DETECTED Final   Candida tropicalis NOT DETECTED NOT DETECTED Final   Cryptococcus neoformans/gattii NOT DETECTED NOT DETECTED Final    Comment: Performed at [facility], [place] [address] [place], [place] [address]  Resp Panel by RT-PCR (Flu A&B, Covid) Anterior Nasal Swab     Status: None   Collection Time: 07/20/22  9:21 AM   Specimen: Anterior Nasal Swab  Result Value Ref Range Status   SARS Coronavirus 2 by RT PCR NEGATIVE NEGATIVE Final    Comment: (NOTE) SARS-CoV-2 target nucleic acids are NOT DETECTED.  The SARS-CoV-2 RNA is generally detectable in upper respiratory specimens during the acute phase of infection. The lowest concentration of SARS-CoV-2 viral copies this assay can detect is 138 copies/mL. A negative result does not preclude SARS-Cov-2 infection and should not be used as the sole basis for treatment or other patient management decisions. A negative result may occur with  improper specimen collection/handling, submission of specimen other than nasopharyngeal swab, presence of viral mutation(s) within the areas targeted by this assay, and inadequate number of viral copies(<138 copies/mL). A negative result must be combined with clinical observations, patient history, and epidemiological information. The  expected result is Negative.  Fact Sheet for Patients:  EntrepreneurPulse.com.au  Fact Sheet for Healthcare Providers:  IncredibleEmployment.be  This test is no t yet approved or cleared by the [place] FDA and  has been authorized for detection and/or diagnosis of SARS-CoV-2 by FDA under an Emergency Use Authorization (EUA). This EUA will remain  in effect (meaning this test can be used) for the duration of the COVID-19 declaration under Section 564(b)(1) of the Act, 21 U.S.C.section 360bbb-3(b)(1), unless the authorization is terminated  or revoked sooner.       Influenza A by PCR NEGATIVE NEGATIVE Final   Influenza B by PCR NEGATIVE NEGATIVE Final    Comment: (NOTE) The Xpert Xpress SARS-CoV-2/FLU/RSV plus assay is intended as an aid in the diagnosis of influenza from Nasopharyngeal swab specimens and should not be used as a sole basis for treatment. Nasal washings and aspirates are unacceptable for Xpert Xpress SARS-CoV-2/FLU/RSV testing.  Fact Sheet for Patients: EntrepreneurPulse.com.au  Fact Sheet for Healthcare Providers: IncredibleEmployment.be  This test is not yet approved or cleared by the [place] FDA and has been authorized for detection and/or diagnosis of SARS-CoV-2 by FDA under an Emergency Use Authorization (EUA). This EUA will remain in effect (meaning this test can be used) for the duration of the COVID-19 declaration under Section 564(b)(1) of the Act, 21 U.S.C. section 360bbb-3(b)(1), unless the authorization is terminated or revoked.  Performed at [facility], [place] [address] [place], [place] [address]   MRSA Next Gen by PCR, Nasal     Status: None   Collection Time: 07/20/22  7:40 PM  Specimen: Nasal Mucosa; Nasal Swab  Result Value Ref Range Status   MRSA by PCR Next Gen NOT DETECTED NOT DETECTED Final    Comment: (NOTE) The GeneXpert MRSA  Assay (FDA approved for NASAL specimens only), is one component of a comprehensive MRSA colonization surveillance program. It is not intended to diagnose MRSA infection nor to guide or monitor treatment for MRSA infections. Test performance is not FDA approved in patients less than 30 years old. Performed at [facility], [place] [address], [place], [place] [address]          Radiology Studies: DG CHEST PORT 1 VIEW  Result Date: 07/21/2022 CLINICAL DATA:  Altered mental status and urinary frequency. Laparoscopic appendectomy 07/20/2022. EXAM: PORTABLE CHEST 1 VIEW COMPARISON:  07/20/2022 FINDINGS: Lungs are hypoinflated with mild hazy prominence of the pulmonary vasculature suggesting mild vascular congestion. No lobar consolidation or effusion. Mild stable cardiomegaly. Remainder of the exam is unchanged. IMPRESSION: Mild stable cardiomegaly with suggestion of mild vascular congestion. Electronically Signed   By: Marin Olp M.D.   On: 07/21/2022 10:02   ECHOCARDIOGRAM COMPLETE  Result Date: 07/21/2022    ECHOCARDIOGRAM REPORT   Patient Name:   GASPER ISHIKAWA Date of Exam: 07/21/2022 Medical Rec #:  [id number]       Height:       63.0 in Accession #:    [id number]      Weight:       161.6 lb Date of Birth:  1935-04-11       BSA:          1.766 m Patient Age:    65 years        BP:           97/64 mmHg Patient Gender: M               HR:           102 bpm. Exam Location:  Inpatient Procedure: 2D Echo, Cardiac Doppler, Color Doppler and Intracardiac            Opacification Agent Indications:    Dyspnea R06.00  History:        Patient has no prior history of Echocardiogram examinations.                 Risk Factors:Hypertension and Dyslipidemia. Acute hypoxic                 respiratory failure. Acute encephalopathy. Sepsis secondary to                 acute appendicitis s/p appendectomy. Hypothyroidism.  Sonographer:    Darlina Sicilian RDCS Referring Phys: [id number] TIMOTHY S OPYD   Sonographer Comments: Suboptimal apical window and suboptimal parasternal window. IMPRESSIONS  1. Left ventricular ejection fraction, by estimation, is 65 to 70%. The left ventricle has normal function. The left ventricle has no regional wall motion abnormalities. Left ventricular diastolic function could not be evaluated.  2. Right ventricular systolic function is normal. The right ventricular size is normal.  3. The mitral valve is normal in structure. No evidence of mitral valve regurgitation. No evidence of mitral stenosis.  4. The aortic valve is tricuspid. Aortic valve regurgitation is not visualized. Aortic valve sclerosis/calcification is present, without any evidence of aortic stenosis.  5. Aortic dilatation noted. There is mild dilatation of the aortic root, measuring 39 mm.  6. The inferior vena cava is normal in size with greater than 50% respiratory variability, suggesting right atrial  pressure of 3 mmHg. FINDINGS  Left Ventricle: Left ventricular ejection fraction, by estimation, is 65 to 70%. The left ventricle has normal function. The left ventricle has no regional wall motion abnormalities. Definity contrast agent was given IV to delineate the left ventricular  endocardial borders. The left ventricular internal cavity size was normal in size. There is no left ventricular hypertrophy. Left ventricular diastolic function could not be evaluated. Right Ventricle: The right ventricular size is normal. No increase in right ventricular wall thickness. Right ventricular systolic function is normal. Left Atrium: Left atrial size was normal in size. Right Atrium: Right atrial size was normal in size. Pericardium: There is no evidence of pericardial effusion. Mitral Valve: The mitral valve is normal in structure. No evidence of mitral valve regurgitation. No evidence of mitral valve stenosis. Tricuspid Valve: The tricuspid valve is normal in structure. Tricuspid valve regurgitation is not demonstrated. No  evidence of tricuspid stenosis. Aortic Valve: The aortic valve is tricuspid. Aortic valve regurgitation is not visualized. Aortic valve sclerosis/calcification is present, without any evidence of aortic stenosis. Pulmonic Valve: The pulmonic valve was normal in structure. Pulmonic valve regurgitation is not visualized. No evidence of pulmonic stenosis. Aorta: Aortic dilatation noted. There is mild dilatation of the aortic root, measuring 39 mm. Venous: The inferior vena cava is normal in size with greater than 50% respiratory variability, suggesting right atrial pressure of 3 mmHg. IAS/Shunts: No atrial level shunt detected by color flow Doppler.  LEFT VENTRICLE PLAX 2D LVIDd:         4.30 cm LVIDs:         3.20 cm LV PW:         1.00 cm LV IVS:        1.10 cm LVOT diam:     2.20 cm LV SV:         48 LV SV Index:   27 LVOT Area:     3.80 cm  LV Volumes (MOD) LV vol d, MOD A2C: 47.0 ml LV vol d, MOD A4C: 59.3 ml LV vol s, MOD A2C: 14.3 ml LV vol s, MOD A4C: 19.9 ml LV SV MOD A2C:     32.7 ml LV SV MOD A4C:     59.3 ml LV SV MOD BP:      37.0 ml LEFT ATRIUM             Index LA diam:        2.80 cm 1.59 cm/m LA Vol (A2C):   21.9 ml 12.40 ml/m LA Vol (A4C):   28.7 ml 16.25 ml/m LA Biplane Vol: 25.9 ml 14.66 ml/m  AORTIC VALVE LVOT Vmax:   73.80 cm/s LVOT Vmean:  48.300 cm/s LVOT VTI:    0.126 m  AORTA Ao Root diam: 3.90 cm Ao Asc diam:  3.50 cm  SHUNTS Systemic VTI:  0.13 m Systemic Diam: 2.20 cm Fransico Him MD Electronically signed by Fransico Him MD Signature Date/Time: 07/21/2022/9:03:12 AM    Final    X-ray chest PA or AP  Result Date: 07/20/2022 CLINICAL DATA:  Shortness of breath following appendectomy EXAM: CHEST  1 VIEW COMPARISON:  07/20/2022 at 0931 hours FINDINGS: Stable cardiomediastinal contours. Low lung volumes. Increasing interstitial opacities throughout both lungs. No large pleural fluid collection. No pneumothorax. IMPRESSION: Increasing interstitial opacities throughout both lungs may reflect  pulmonary edema. Electronically Signed   By: Davina Poke D.O.   On: 07/20/2022 18:35   CT Abdomen Pelvis W Contrast  Result Date: 07/20/2022  CLINICAL DATA:  Right lower quadrant pain EXAM: CT ABDOMEN AND PELVIS WITH CONTRAST TECHNIQUE: Multidetector CT imaging of the abdomen and pelvis was performed using the standard protocol following bolus administration of intravenous contrast. RADIATION DOSE REDUCTION: This exam was performed according to the departmental dose-optimization program which includes automated exposure control, adjustment of the mA and/or kV according to patient size and/or use of iterative reconstruction technique. CONTRAST:  OMNIPAQUE IOHEXOL 300 MG/ML  SOLN COMPARISON:  08/26/2020 FINDINGS: Lower chest: Tortuosity of the descending thoracic aorta. Coronary artery atherosclerosis. Hepatobiliary: Mildly decreased attenuation of the hepatic parenchyma. No focal liver lesion is identified. Unremarkable gallbladder. No hyperdense gallstone. No biliary dilatation. Pancreas: Unremarkable. No pancreatic ductal dilatation or surrounding inflammatory changes. Spleen: Normal in size without focal abnormality. Adrenals/Urinary Tract: Adrenal glands are unremarkable. Kidneys are normal, without renal calculi, solid lesion, or hydronephrosis. Mild urinary bladder wall thickening. The visualized urethra is moderately distended with fluid along its full length with urothelial enhancement (series 6, image 75). Stomach/Bowel: The appendix is abnormally dilated and hyperenhancing with surrounding periappendiceal fat stranding and trace free fluid. No organized periappendiceal fluid collection. No extraluminal air. No dilated loops of bowel to suggest obstruction. Stomach within normal limits. No additional sites of focal bowel inflammation. Vascular/Lymphatic: Scattered aortoiliac atherosclerotic calcifications without aneurysm. No abdominopelvic lymphadenopathy. Reproductive: Prostate is  unremarkable. Other: No ascites. No organized abdominopelvic fluid collection. No pneumoperitoneum. No abdominal wall hernia. 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 No new or acute bony findings. IMPRESSION: 1. Acute uncomplicated appendicitis. 2. Mild urinary bladder wall thickening. The visualized urethra is moderately distended with diffuse urothelial enhancement. Findings suspicious for urethritis and cystitis. Correlate with urinalysis. The possibility of a distal urethral stricture is also a consideration. 3. Hepatic steatosis. 4. Aortic and coronary artery atherosclerosis (ICD10-I70.0). Electronically Signed   By: Duanne Guess D.O.   On: 07/20/2022 11:02   CT Head Wo Contrast  Result Date: 07/20/2022 CLINICAL DATA:  Status post fall. EXAM: CT HEAD WITHOUT CONTRAST CT CERVICAL SPINE WITHOUT CONTRAST TECHNIQUE: Multidetector CT imaging of the head and cervical spine was performed following the standard protocol without intravenous contrast. Multiplanar CT image reconstructions of the cervical spine were also generated. RADIATION DOSE REDUCTION: This exam was performed according to the departmental dose-optimization program which includes automated exposure control, adjustment of the mA and/or kV according to patient size and/or use of iterative reconstruction technique. COMPARISON:  02/15/2015 head CT. FINDINGS: CT HEAD FINDINGS Brain: There is no evidence for acute hemorrhage, hydrocephalus, mass lesion, or abnormal extra-axial fluid collection. No definite CT evidence for acute infarction. Diffuse loss of parenchymal volume is consistent with atrophy. Patchy low attenuation in the deep hemispheric and periventricular white matter is nonspecific, but likely reflects chronic microvascular ischemic demyelination. Vascular: No hyperdense vessel or unexpected calcification. Skull: No evidence for fracture. No worrisome lytic or sclerotic  lesion. Right-sided burr holes evident. Sinuses/Orbits: The visualized paranasal sinuses and mastoid air cells are clear. Visualized portions of the globes and intraorbital fat are unremarkable. Other: None. CT CERVICAL SPINE FINDINGS Alignment: Trace anterolisthesis of C4 on 5 and C5 on 6 is compatible with a degree of facet disease at those levels. No evidence for traumatic subluxation. Skull base and vertebrae: No acute fracture. No primary bone lesion or focal pathologic process. Soft tissues and spinal canal: No prevertebral fluid or swelling. No visible canal hematoma. Disc levels: Mild loss of disc height noted C6-7 and C7-T1. Diffuse  facet osteoarthritis noted bilaterally. Upper chest: Centrilobular emphysema. Other: None. IMPRESSION: 1. No acute intracranial abnormality. 2. Atrophy with chronic small vessel white matter ischemic disease. 3. No evidence for cervical spine fracture or traumatic subluxation. 4. Degenerative changes in the cervical spine as above. Electronically Signed   By: Kennith Center M.D.   On: 07/20/2022 10:52   CT Cervical Spine Wo Contrast  Result Date: 07/20/2022 CLINICAL DATA:  Status post fall. EXAM: CT HEAD WITHOUT CONTRAST CT CERVICAL SPINE WITHOUT CONTRAST TECHNIQUE: Multidetector CT imaging of the head and cervical spine was performed following the standard protocol without intravenous contrast. Multiplanar CT image reconstructions of the cervical spine were also generated. RADIATION DOSE REDUCTION: This exam was performed according to the departmental dose-optimization program which includes automated exposure control, adjustment of the mA and/or kV according to patient size and/or use of iterative reconstruction technique. COMPARISON:  02/15/2015 head CT. FINDINGS: CT HEAD FINDINGS Brain: There is no evidence for acute hemorrhage, hydrocephalus, mass lesion, or abnormal extra-axial fluid collection. No definite CT evidence for acute infarction. Diffuse loss of parenchymal  volume is consistent with atrophy. Patchy low attenuation in the deep hemispheric and periventricular white matter is nonspecific, but likely reflects chronic microvascular ischemic demyelination. Vascular: No hyperdense vessel or unexpected calcification. Skull: No evidence for fracture. No worrisome lytic or sclerotic lesion. Right-sided burr holes evident. Sinuses/Orbits: The visualized paranasal sinuses and mastoid air cells are clear. Visualized portions of the globes and intraorbital fat are unremarkable. Other: None. CT CERVICAL SPINE FINDINGS Alignment: Trace anterolisthesis of C4 on 5 and C5 on 6 is compatible with a degree of facet disease at those levels. No evidence for traumatic subluxation. Skull base and vertebrae: No acute fracture. No primary bone lesion or focal pathologic process. Soft tissues and spinal canal: No prevertebral fluid or swelling. No visible canal hematoma. Disc levels: Mild loss of disc height noted C6-7 and C7-T1. Diffuse facet osteoarthritis noted bilaterally. Upper chest: Centrilobular emphysema. Other: None. IMPRESSION: 1. No acute intracranial abnormality. 2. Atrophy with chronic small vessel white matter ischemic disease. 3. No evidence for cervical spine fracture or traumatic subluxation. 4. Degenerative changes in the cervical spine as above. Electronically Signed   By: Kennith Center M.D.   On: 07/20/2022 10:52        Scheduled Meds:  acetaminophen  1,000 mg Oral TID   aspirin EC  81 mg Oral q AM   Chlorhexidine Gluconate Cloth  6 each Topical Q0600   levothyroxine  50 mcg Oral QAC breakfast   lip balm   Topical BID   losartan  25 mg Oral Daily   multivitamin with minerals  1 tablet Oral Daily   polycarbophil  625 mg Oral BID   Continuous Infusions:  sodium chloride Stopped (07/21/22 1432)   methocarbamol (ROBAXIN) IV     ondansetron (ZOFRAN) IV     piperacillin-tazobactam (ZOSYN)  IV 12.5 mL/hr at 07/22/22 0700     LOS: 1 day        Kathlen Mody, MD [facility]   To contact the attending provider between 7A-7P or the covering provider during after hours 7P-7A, please log into the web site [email or web address] and access using universal [place] password for that web site. If you do not have the password, please call the hospital operator.  07/22/2022, 9:40 AM

## 2022-07-22 NOTE — Progress Notes (Signed)
[name] [id number] 07/10/1935  CARE TEAM:  PCP: Deatra James, MD  Outpatient Care Team: Patient Care Team: Deatra James, MD as PCP - General (Family Medicine)  Inpatient Treatment Team: Treatment Team: Attending Provider: Bishop Limbo, MD; Rounding Team: Delmer Islam, MD; Physical Therapist: Carmina Miller, PT; Occupational Therapist: Kelli Churn, OT; Registered Nurse: Ronnette Juniper, RN; Utilization Review: Helyn Numbers, RN; Occupational Therapist: Jolly Mango, Student-OT   Problem List:   Principal Problem:   Acute gangrenous appendicitis s/p lap appendectomy 07/20/2022 Active Problems:   Ataxia   Hypertension   Hyperlipidemia   Hypothyroidism   Acute encephalopathy   Severe sepsis (HCC)   Acute respiratory failure with hypoxia (HCC)   Acute pulmonary edema (HCC)   Non-English speaking patient   History of subdural hematoma   History of fall   Acute appendicitis   2 Days Post-Op  07/20/2022  Preop diagnosis:  Acute appendicitis   Postop diagnosis:  Acute gangrenous perforated appendicitis   Procedure:  Laparoscopic appendectomy   Surgeon:  Darnell Level, MD    Assessment  Stabilizing after urgent appendectomy.  [facility] Stay = 1 days)  Plan:  Stabilizing the point probably can go to the floor if medicine agrees.  -IV antibiotics x5 days minimum.  I would do piperacillin/tazobactam per complicated appendicitis protocol  -I/dysphagia 1 diet for now.  Hold off on being too aggressive.  Patient is strict vegetarian.  Grandson wondering if there is a vegetarian supplemental shake in the hospital versus providing 1 from home.  We will ask and see if nutrition has any ideas.   Hold off on being too aggressive since elderly with gangrenous appendix with some evidence of shock = high risk of postoperative ileus.  -Internal medicine help appreciated.  Suspect this patient will have somewhat of a prolonged recovery given advanced age and other health  issues  -VTE prophylaxis- SCDs, chemical prophylaxis   -mobilize as tolerated to help recovery.  Patient's baseline is to walk with a walker at home according to the grandson.  See if we can get physical/occupational therapies to help evaluate with nursing to see what his level of independence/needs are.  Disposition:  Disposition:  The patient is from: Home  Anticipate discharge to:  Home with Home Health  Anticipated Date of Discharge is:  September 12,2023    Barriers to discharge:  Pending Clinical improvement (more likely than not)  Patient currently is NOT MEDICALLY STABLE for discharge from the hospital from a surgery standpoint.      I reviewed nursing notes, hospitalist notes, last 24 h vitals and pain scores, last 48 h intake and output, last 24 h labs and trends, and last 24 h imaging results. I have reviewed this patient's available data, including medical history, events of note, test results, etc as part of my evaluation.  A significant portion of that time was spent in counseling.  Care during the described time interval was provided by me.  This care required moderate level of medical decision making.  07/22/2022    Subjective: (Chief complaint) No major events.  Able to wean off oxygen.  Tolerating liquids without much nausea.  Not wanting out of bed much.  Grandson at bedside.  ICU nursing just outside room.  Objective:  Vital signs:  Vitals:   07/22/22 0500 07/22/22 0600 07/22/22 0700 07/22/22 0800  BP: (!) 103/52 113/61 120/86 116/75  Pulse: 76 79 85 81  Resp:    16  Temp:  TempSrc:      SpO2: 91% 90% 90% 95%  Weight:      Height:        Last BM Date : 07/21/22  Intake/Output   Yesterday:  09/09 0701 - 09/10 0700 In: 189.1 [I.V.:37.6; IV Piggyback:151.4] Out: 825 [Urine:825] This shift:  No intake/output data recorded.  Bowel function:  Flatus: No  BM:  No  Drain: (No drain)   Physical Exam:  General: Pt awake/alert  in no acute distress Eyes: PERRL, normal EOM.  Sclera clear.  No icterus Neuro: CN II-XII intact w/o focal sensory/motor deficits. Lymph: No head/neck/groin lymphadenopathy Psych:  No delerium/psychosis/paranoia.  Oriented x 1 HENT: Normocephalic, Mucus membranes moist.  No thrush Neck: Supple, No tracheal deviation.  No obvious thyromegaly Chest: No pain to chest wall compression.  Good respiratory excursion.  No audible wheezing CV:  Pulses intact.  Regular rhythm.  No major extremity edema MS: Normal AROM mjr joints.  No obvious deformity  Abdomen: Soft.  Mildy distended.  Mildly tender at incisions only.  No evidence of peritonitis.  No incarcerated hernias.  Ext:   No deformity.  No mjr edema.  No cyanosis Skin: No petechiae / purpurea.  No major sores.  Warm and dry    Results:   Cultures: Recent Results (from the past 720 hour(s))  Culture, blood (Routine x 2)     Status: None (Preliminary result)   Collection Time: 07/20/22  9:00 AM   Specimen: BLOOD  Result Value Ref Range Status   Specimen Description   Final    BLOOD RIGHT ANTECUBITAL Performed at [facility], [address], [place], [place] [address]    Special Requests   Final    BOTTLES DRAWN AEROBIC AND ANAEROBIC Blood Culture results may not be optimal due to an excessive volume of blood received in culture bottles Performed at [facility], [address], [place], [place] [address]    Culture   Final    NO GROWTH 2 DAYS Performed at [facility], [address], [place], [place] [address]    Report Status PENDING  Incomplete  Urine Culture     Status: None   Collection Time: 07/20/22  9:00 AM   Specimen: In/Out Cath Urine  Result Value Ref Range Status   Specimen Description   Final    IN/OUT CATH URINE Performed at [facility], [address], [place], [place] [address]    Special Requests   Final    NONE Performed at [facility], [address] [place], [place] [address]    Culture   Final    NO GROWTH Performed at [facility], [address], [place], [place] [address]    Report Status 07/21/2022 FINAL  Final  Culture, blood (Routine x 2)     Status: None (Preliminary result)   Collection Time: 07/20/22  9:12 AM   Specimen: BLOOD  Result Value Ref Range Status   Specimen Description   Final    BLOOD LEFT ANTECUBITAL Performed at [facility], [address] [place], [place] [address]    Special Requests   Final    BOTTLES DRAWN AEROBIC AND ANAEROBIC Blood Culture results may not be optimal due to an excessive volume of blood received in culture bottles Performed at [facility], [address] [place], [place] [address]    Culture  Setup Time   Final  GRAM POSITIVE COCCI IN CLUSTERS ANAEROBIC BOTTLE ONLY CRITICAL RESULT CALLED TO, READ BACK BY AND VERIFIED WITH: Neta Mends [id number] AT 1752 BY CM Performed at [facility], [address] [place], [place] [address]    Culture GRAM POSITIVE COCCI  Final   Report Status PENDING  Incomplete  Blood Culture ID Panel (Reflexed)     Status: Abnormal   Collection Time: 07/20/22  9:12 AM  Result Value Ref Range Status   Enterococcus faecalis NOT DETECTED NOT DETECTED Final   Enterococcus Faecium NOT DETECTED NOT DETECTED Final   Listeria monocytogenes NOT DETECTED NOT DETECTED Final   Staphylococcus species DETECTED (A) NOT DETECTED Final    Comment: CRITICAL RESULT CALLED TO, READ BACK BY AND VERIFIED WITH: PHARMD E WILLIAMSON [id number] AT 1752 BY CM    Staphylococcus aureus (BCID) NOT DETECTED NOT DETECTED Final   Staphylococcus epidermidis NOT DETECTED NOT DETECTED Final   Staphylococcus lugdunensis NOT DETECTED NOT DETECTED Final   Streptococcus species NOT DETECTED NOT DETECTED Final   Streptococcus agalactiae NOT DETECTED NOT DETECTED Final   Streptococcus pneumoniae NOT  DETECTED NOT DETECTED Final   Streptococcus pyogenes NOT DETECTED NOT DETECTED Final   A.calcoaceticus-baumannii NOT DETECTED NOT DETECTED Final   Bacteroides fragilis NOT DETECTED NOT DETECTED Final   Enterobacterales NOT DETECTED NOT DETECTED Final   Enterobacter cloacae complex NOT DETECTED NOT DETECTED Final   Escherichia coli NOT DETECTED NOT DETECTED Final   Klebsiella aerogenes NOT DETECTED NOT DETECTED Final   Klebsiella oxytoca NOT DETECTED NOT DETECTED Final   Klebsiella pneumoniae NOT DETECTED NOT DETECTED Final   Proteus species NOT DETECTED NOT DETECTED Final   Salmonella species NOT DETECTED NOT DETECTED Final   Serratia marcescens NOT DETECTED NOT DETECTED Final   Haemophilus influenzae NOT DETECTED NOT DETECTED Final   Neisseria meningitidis NOT DETECTED NOT DETECTED Final   Pseudomonas aeruginosa NOT DETECTED NOT DETECTED Final   Stenotrophomonas maltophilia NOT DETECTED NOT DETECTED Final   Candida albicans NOT DETECTED NOT DETECTED Final   Candida auris NOT DETECTED NOT DETECTED Final   Candida glabrata NOT DETECTED NOT DETECTED Final   Candida krusei NOT DETECTED NOT DETECTED Final   Candida parapsilosis NOT DETECTED NOT DETECTED Final   Candida tropicalis NOT DETECTED NOT DETECTED Final   Cryptococcus neoformans/gattii NOT DETECTED NOT DETECTED Final    Comment: Performed at [facility], [address] [place], [place] [address]  Resp Panel by RT-PCR (Flu A&B, Covid) Anterior Nasal Swab     Status: None   Collection Time: 07/20/22  9:21 AM   Specimen: Anterior Nasal Swab  Result Value Ref Range Status   SARS Coronavirus 2 by RT PCR NEGATIVE NEGATIVE Final    Comment: (NOTE) SARS-CoV-2 target nucleic acids are NOT DETECTED.  The SARS-CoV-2 RNA is generally detectable in upper respiratory specimens during the acute phase of infection. The lowest concentration of SARS-CoV-2 viral copies this assay can detect is 138 copies/mL. A negative result does not  preclude SARS-Cov-2 infection and should not be used as the sole basis for treatment or other patient management decisions. A negative result may occur with  improper specimen collection/handling, submission of specimen other than nasopharyngeal swab, presence of viral mutation(s) within the areas targeted by this assay, and inadequate number of viral copies(<138 copies/mL). A negative result must be combined with clinical observations, patient history, and epidemiological information. The expected result is Negative.  Fact Sheet for Patients:  BloggerCourse.com  Fact Sheet for  Healthcare Providers:  SeriousBroker.it  This test is no t yet approved or cleared by the [place] and  has been authorized for detection and/or diagnosis of SARS-CoV-2 by FDA under an Emergency Use Authorization (EUA). This EUA will remain  in effect (meaning this test can be used) for the duration of the COVID-19 declaration under Section 564(b)(1) of the Act, 21 U.S.C.section 360bbb-3(b)(1), unless the authorization is terminated  or revoked sooner.       Influenza A by PCR NEGATIVE NEGATIVE Final   Influenza B by PCR NEGATIVE NEGATIVE Final    Comment: (NOTE) The Xpert Xpress SARS-CoV-2/FLU/RSV plus assay is intended as an aid in the diagnosis of influenza from Nasopharyngeal swab specimens and should not be used as a sole basis for treatment. Nasal washings and aspirates are unacceptable for Xpert Xpress SARS-CoV-2/FLU/RSV testing.  Fact Sheet for Patients: BloggerCourse.com  Fact Sheet for Healthcare Providers: SeriousBroker.it  This test is not yet approved or cleared by the [place] FDA and has been authorized for detection and/or diagnosis of SARS-CoV-2 by FDA under an Emergency Use Authorization (EUA). This EUA will remain in effect (meaning this test can be used) for the  duration of the COVID-19 declaration under Section 564(b)(1) of the Act, 21 U.S.C. section 360bbb-3(b)(1), unless the authorization is terminated or revoked.  Performed at [facility], [address] [place], [place] [address]   MRSA Next Gen by PCR, Nasal     Status: None   Collection Time: 07/20/22  7:40 PM   Specimen: Nasal Mucosa; Nasal Swab  Result Value Ref Range Status   MRSA by PCR Next Gen NOT DETECTED NOT DETECTED Final    Comment: (NOTE) The GeneXpert MRSA Assay (FDA approved for NASAL specimens only), is one component of a comprehensive MRSA colonization surveillance program. It is not intended to diagnose MRSA infection nor to guide or monitor treatment for MRSA infections. Test performance is not FDA approved in patients less than 39 years old. Performed at [facility], [address], [place], [place] [address]     Labs: Results for orders placed or performed during the hospital encounter of 07/20/22 (from the past 48 hour(s))  Urinalysis, Routine w reflex microscopic Urine, Clean Catch     Status: Abnormal   Collection Time: 07/20/22  8:54 AM  Result Value Ref Range   Color, Urine YELLOW YELLOW   APPearance CLEAR CLEAR   Specific Gravity, Urine 1.010 1.005 - 1.030   pH 6.0 5.0 - 8.0   Glucose, UA NEGATIVE NEGATIVE mg/dL   Hgb urine dipstick NEGATIVE NEGATIVE   Bilirubin Urine NEGATIVE NEGATIVE   Ketones, ur 20 (A) NEGATIVE mg/dL   Protein, ur 30 (A) NEGATIVE mg/dL   Nitrite NEGATIVE NEGATIVE   Leukocytes,Ua NEGATIVE NEGATIVE   RBC / HPF 0-5 0 - 5 RBC/hpf   WBC, UA 0-5 0 - 5 WBC/hpf   Bacteria, UA NONE SEEN NONE SEEN   Mucus PRESENT    Hyaline Casts, UA PRESENT     Comment: Performed at [facility], [address], [place], [place] [address]  Comprehensive metabolic panel     Status: Abnormal   Collection Time: 07/20/22  9:00 AM  Result Value Ref Range   Sodium 136 135 - 145 mmol/L    Potassium 4.4 3.5 - 5.1 mmol/L   Chloride 101 98 - 111 mmol/L   CO2 25 22 - 32 mmol/L   Glucose, Bld 186 (H) 70 - 99  mg/dL    Comment: Glucose reference range applies only to samples taken after fasting for at least 8 hours.   BUN 10 8 - 23 mg/dL   Creatinine, Ser 1.61 0.61 - 1.24 mg/dL   Calcium 9.3 8.9 - 09.6 mg/dL   Total Protein 7.6 6.5 - 8.1 g/dL   Albumin 4.1 3.5 - 5.0 g/dL   AST 26 15 - 41 U/L   ALT 18 0 - 44 U/L   Alkaline Phosphatase 32 (L) 38 - 126 U/L   Total Bilirubin 1.5 (H) 0.3 - 1.2 mg/dL   GFR, Estimated >04 >54 mL/min    Comment: (NOTE) Calculated using the CKD-EPI Creatinine Equation (2021)    Anion gap 10 5 - 15    Comment: Performed at [facility], [address], [place], [place] [address]  CBC with Differential     Status: Abnormal   Collection Time: 07/20/22  9:00 AM  Result Value Ref Range   WBC 12.8 (H) 4.0 - 10.5 K/uL   RBC 4.80 4.22 - 5.81 MIL/uL   Hemoglobin 15.6 13.0 - 17.0 g/dL   HCT 91.4 78.2 - 95.6 %   MCV 95.0 80.0 - 100.0 fL   MCH 32.5 26.0 - 34.0 pg   MCHC 34.2 30.0 - 36.0 g/dL   RDW 21.3 08.6 - 57.8 %   Platelets 172 150 - 400 K/uL   nRBC 0.0 0.0 - 0.2 %   Neutrophils Relative % 83 %   Neutro Abs 10.6 (H) 1.7 - 7.7 K/uL   Lymphocytes Relative 8 %   Lymphs Abs 1.0 0.7 - 4.0 K/uL   Monocytes Relative 9 %   Monocytes Absolute 1.2 (H) 0.1 - 1.0 K/uL   Eosinophils Relative 0 %   Eosinophils Absolute 0.1 0.0 - 0.5 K/uL   Basophils Relative 0 %   Basophils Absolute 0.0 0.0 - 0.1 K/uL   Immature Granulocytes 0 %   Abs Immature Granulocytes 0.05 0.00 - 0.07 K/uL    Comment: Performed at [facility], [address] [place], [place] [address]  Protime-INR     Status: Abnormal   Collection Time: 07/20/22  9:00 AM  Result Value Ref Range   Prothrombin Time 16.5 (H) 11.4 - 15.2 seconds   INR 1.3 (H) 0.8 - 1.2    Comment: (NOTE) INR goal varies based on device and disease states. Performed at [facility], [address], [place], [place] [address]   Culture, blood (Routine x 2)     Status: None (Preliminary result)   Collection Time: 07/20/22  9:00 AM   Specimen: BLOOD  Result Value Ref Range   Specimen Description      BLOOD RIGHT ANTECUBITAL Performed at [facility], [address] [place], [place] [address]    Special Requests      BOTTLES DRAWN AEROBIC AND ANAEROBIC Blood Culture results may not be optimal due to an excessive volume of blood received in culture bottles Performed at [facility], [address], [place], [place] [address]    Culture      NO GROWTH 2 DAYS Performed at [facility], [address], [place], [place] [address]    Report Status PENDING   APTT     Status: None   Collection Time: 07/20/22  9:00 AM  Result Value Ref Range   aPTT 32 24 - 36 seconds    Comment: Performed at [facility],  [address], [place], [place] [address]  Urine Culture     Status: None   Collection Time: 07/20/22  9:00 AM   Specimen: In/Out Cath Urine  Result Value Ref Range   Specimen Description      IN/OUT CATH URINE Performed at [facility], [address], [place], [place] [address]    Special Requests      NONE Performed at [facility], [address] [place], [place] [address]    Culture      NO GROWTH Performed at [facility], [address] [place], [place] [address]    Report Status 07/21/2022 FINAL   Lactic acid, plasma     Status: Abnormal   Collection Time: 07/20/22  9:05 AM  Result Value Ref Range   Lactic Acid, Venous 3.9 (HH) 0.5 - 1.9 mmol/L    Comment: CRITICAL RESULT CALLED TO, READ BACK BY AND VERIFIED WITH BIENFANG, A RN @ [phone] 07/20/22. GILBERT, L Performed at [facility], [address] [place], [place] [address]   Culture, blood (Routine x 2)     Status: None (Preliminary result)    Collection Time: 07/20/22  9:12 AM   Specimen: BLOOD  Result Value Ref Range   Specimen Description      BLOOD LEFT ANTECUBITAL Performed at [facility], [address] [place], [place] [address]    Special Requests      BOTTLES DRAWN AEROBIC AND ANAEROBIC Blood Culture results may not be optimal due to an excessive volume of blood received in culture bottles Performed at [facility], [address], [place], [place] [address]    Culture  Setup Time      GRAM POSITIVE COCCI IN CLUSTERS ANAEROBIC BOTTLE ONLY CRITICAL RESULT CALLED TO, READ BACK BY AND VERIFIED WITH: Neta Mends [id number] AT 1752 BY CM Performed at [facility], [address], [place], [place] [address]    Culture GRAM POSITIVE COCCI    Report Status PENDING   Blood Culture ID Panel (Reflexed)     Status: Abnormal   Collection Time: 07/20/22  9:12 AM  Result Value Ref Range   Enterococcus faecalis NOT DETECTED NOT DETECTED   Enterococcus Faecium NOT DETECTED NOT DETECTED   Listeria monocytogenes NOT DETECTED NOT DETECTED   Staphylococcus species DETECTED (A) NOT DETECTED    Comment: CRITICAL RESULT CALLED TO, READ BACK BY AND VERIFIED WITH: PHARMD E WILLIAMSON [id number] AT 1752 BY CM    Staphylococcus aureus (BCID) NOT DETECTED NOT DETECTED   Staphylococcus epidermidis NOT DETECTED NOT DETECTED   Staphylococcus lugdunensis NOT DETECTED NOT DETECTED   Streptococcus species NOT DETECTED NOT DETECTED   Streptococcus agalactiae NOT DETECTED NOT DETECTED   Streptococcus pneumoniae NOT DETECTED NOT DETECTED   Streptococcus pyogenes NOT DETECTED NOT DETECTED   A.calcoaceticus-baumannii NOT DETECTED NOT DETECTED   Bacteroides fragilis NOT DETECTED NOT DETECTED   Enterobacterales NOT DETECTED NOT DETECTED   Enterobacter cloacae complex NOT DETECTED NOT DETECTED   Escherichia coli NOT DETECTED NOT DETECTED   Klebsiella aerogenes NOT DETECTED NOT DETECTED   Klebsiella  oxytoca NOT DETECTED NOT DETECTED   Klebsiella pneumoniae NOT DETECTED NOT DETECTED   Proteus species NOT DETECTED NOT DETECTED   Salmonella species NOT DETECTED NOT DETECTED   Serratia marcescens NOT DETECTED NOT DETECTED   Haemophilus influenzae NOT DETECTED NOT DETECTED   Neisseria meningitidis NOT DETECTED NOT DETECTED   Pseudomonas aeruginosa NOT  DETECTED NOT DETECTED   Stenotrophomonas maltophilia NOT DETECTED NOT DETECTED   Candida albicans NOT DETECTED NOT DETECTED   Candida auris NOT DETECTED NOT DETECTED   Candida glabrata NOT DETECTED NOT DETECTED   Candida krusei NOT DETECTED NOT DETECTED   Candida parapsilosis NOT DETECTED NOT DETECTED   Candida tropicalis NOT DETECTED NOT DETECTED   Cryptococcus neoformans/gattii NOT DETECTED NOT DETECTED    Comment: Performed at [facility], [address], [place], [place] [address]  Resp Panel by RT-PCR (Flu A&B, Covid) Anterior Nasal Swab     Status: None   Collection Time: 07/20/22  9:21 AM   Specimen: Anterior Nasal Swab  Result Value Ref Range   SARS Coronavirus 2 by RT PCR NEGATIVE NEGATIVE    Comment: (NOTE) SARS-CoV-2 target nucleic acids are NOT DETECTED.  The SARS-CoV-2 RNA is generally detectable in upper respiratory specimens during the acute phase of infection. The lowest concentration of SARS-CoV-2 viral copies this assay can detect is 138 copies/mL. A negative result does not preclude SARS-Cov-2 infection and should not be used as the sole basis for treatment or other patient management decisions. A negative result may occur with  improper specimen collection/handling, submission of specimen other than nasopharyngeal swab, presence of viral mutation(s) within the areas targeted by this assay, and inadequate number of viral copies(<138 copies/mL). A negative result must be combined with clinical observations, patient history, and epidemiological information. The expected result is Negative.  Fact Sheet for  Patients:  BloggerCourse.com  Fact Sheet for Healthcare Providers:  SeriousBroker.it  This test is no t yet approved or cleared by the [place] FDA and  has been authorized for detection and/or diagnosis of SARS-CoV-2 by FDA under an Emergency Use Authorization (EUA). This EUA will remain  in effect (meaning this test can be used) for the duration of the COVID-19 declaration under Section 564(b)(1) of the Act, 21 U.S.C.section 360bbb-3(b)(1), unless the authorization is terminated  or revoked sooner.       Influenza A by PCR NEGATIVE NEGATIVE   Influenza B by PCR NEGATIVE NEGATIVE    Comment: (NOTE) The Xpert Xpress SARS-CoV-2/FLU/RSV plus assay is intended as an aid in the diagnosis of influenza from Nasopharyngeal swab specimens and should not be used as a sole basis for treatment. Nasal washings and aspirates are unacceptable for Xpert Xpress SARS-CoV-2/FLU/RSV testing.  Fact Sheet for Patients: BloggerCourse.com  Fact Sheet for Healthcare Providers: SeriousBroker.it  This test is not yet approved or cleared by the [place] FDA and has been authorized for detection and/or diagnosis of SARS-CoV-2 by FDA under an Emergency Use Authorization (EUA). This EUA will remain in effect (meaning this test can be used) for the duration of the COVID-19 declaration under Section 564(b)(1) of the Act, 21 U.S.C. section 360bbb-3(b)(1), unless the authorization is terminated or revoked.  Performed at [facility], [address], [place], [place] [address]   CBG monitoring, ED     Status: Abnormal   Collection Time: 07/20/22  9:24 AM  Result Value Ref Range   Glucose-Capillary 174 (H) 70 - 99 mg/dL    Comment: Glucose reference range applies only to samples taken after fasting for at least 8 hours.  Lactic acid, plasma     Status: Abnormal   Collection Time:  07/20/22 11:44 AM  Result Value Ref Range   Lactic Acid, Venous 3.5 (HH) 0.5 - 1.9 mmol/L    Comment: CRITICAL VALUE NOTED. VALUE IS CONSISTENT WITH PREVIOUSLY REPORTED/CALLED  VALUE Performed at [facility], [address], [place], [place] [address]   MRSA Next Gen by PCR, Nasal     Status: None   Collection Time: 07/20/22  7:40 PM   Specimen: Nasal Mucosa; Nasal Swab  Result Value Ref Range   MRSA by PCR Next Gen NOT DETECTED NOT DETECTED    Comment: (NOTE) The GeneXpert MRSA Assay (FDA approved for NASAL specimens only), is one component of a comprehensive MRSA colonization surveillance program. It is not intended to diagnose MRSA infection nor to guide or monitor treatment for MRSA infections. Test performance is not FDA approved in patients less than 10 years old. Performed at [facility], [address] [address], [place], [place] [address]   Lactic acid, plasma     Status: Abnormal   Collection Time: 07/20/22  8:38 PM  Result Value Ref Range   Lactic Acid, Venous 4.5 (HH) 0.5 - 1.9 mmol/L    Comment: CRITICAL VALUE NOTED. VALUE IS CONSISTENT WITH PREVIOUSLY REPORTED/CALLED VALUE Performed at [facility], [address], [place], [place] [address]   Brain natriuretic peptide     Status: Abnormal   Collection Time: 07/20/22  8:38 PM  Result Value Ref Range   B Natriuretic Peptide 195.5 (H) 0.0 - 100.0 pg/mL    Comment: Performed at [facility], [address] [place], [place] [address]  TSH     Status: None   Collection Time: 07/20/22  8:38 PM  Result Value Ref Range   TSH 1.653 0.350 - 4.500 uIU/mL    Comment: Performed by a 3rd Generation assay with a functional sensitivity of <=0.01 uIU/mL. Performed at [facility], [address] [place], [place] [address]   Ammonia     Status: Abnormal   Collection Time: 07/20/22  8:38 PM  Result Value Ref Range   Ammonia 36 (H) 9 - 35  umol/L    Comment: Performed at [facility], [address], [place], [place] [address]  Vitamin B12     Status: None   Collection Time: 07/20/22  8:38 PM  Result Value Ref Range   Vitamin B-12 272 180 - 914 pg/mL    Comment: (NOTE) This assay is not validated for testing neonatal or myeloproliferative syndrome specimens for Vitamin B12 levels. Performed at [facility], [address], [place], [place] [address]   RPR     Status: None   Collection Time: 07/20/22  8:38 PM  Result Value Ref Range   RPR Ser Ql NON REACTIVE NON REACTIVE    Comment: Performed at [facility], [address], [place], [place] [address]  CBC with Differential/Platelet     Status: Abnormal   Collection Time: 07/21/22 10:41 AM  Result Value Ref Range   WBC 15.0 (H) 4.0 - 10.5 K/uL   RBC 4.49 4.22 - 5.81 MIL/uL   Hemoglobin 14.8 13.0 - 17.0 g/dL   HCT 28.4 13.2 - 44.0 %   MCV 97.3 80.0 - 100.0 fL   MCH 33.0 26.0 - 34.0 pg   MCHC 33.9 30.0 - 36.0 g/dL   RDW 10.2 72.5 - 36.6 %   Platelets 160 150 - 400 K/uL   nRBC 0.0 0.0 - 0.2 %   Neutrophils Relative % 82 %   Neutro Abs 12.3 (H) 1.7 - 7.7 K/uL   Lymphocytes Relative 10 %   Lymphs Abs 1.5 0.7 - 4.0 K/uL   Monocytes  Relative 7 %   Monocytes Absolute 1.0 0.1 - 1.0 K/uL   Eosinophils Relative 0 %   Eosinophils Absolute 0.0 0.0 - 0.5 K/uL   Basophils Relative 0 %   Basophils Absolute 0.0 0.0 - 0.1 K/uL   Immature Granulocytes 1 %   Abs Immature Granulocytes 0.09 (H) 0.00 - 0.07 K/uL    Comment: Performed at [facility], [address] [place], [place] [address]  Comprehensive metabolic panel     Status: Abnormal   Collection Time: 07/21/22 10:41 AM  Result Value Ref Range   Sodium 139 135 - 145 mmol/L   Potassium 4.3 3.5 - 5.1 mmol/L   Chloride 102 98 - 111 mmol/L   CO2 29 22 - 32 mmol/L   Glucose, Bld 170 (H) 70 - 99 mg/dL    Comment: Glucose reference range applies only to  samples taken after fasting for at least 8 hours.   BUN 15 8 - 23 mg/dL   Creatinine, Ser 6.040.88 0.61 - 1.24 mg/dL   Calcium 8.8 (L) 8.9 - 10.3 mg/dL   Total Protein 7.6 6.5 - 8.1 g/dL   Albumin 3.8 3.5 - 5.0 g/dL   AST 44 (H) 15 - 41 U/L   ALT 22 0 - 44 U/L   Alkaline Phosphatase 28 (L) 38 - 126 U/L   Total Bilirubin 1.5 (H) 0.3 - 1.2 mg/dL   GFR, Estimated >54>60 >09>60 mL/min    Comment: (NOTE) Calculated using the CKD-EPI Creatinine Equation (2021)    Anion gap 8 5 - 15    Comment: Performed at [facility], [address] [place], [place] [address]  Lactic acid, plasma     Status: Abnormal   Collection Time: 07/21/22 10:41 AM  Result Value Ref Range   Lactic Acid, Venous 2.1 (HH) 0.5 - 1.9 mmol/L    Comment: CRITICAL RESULT CALLED TO, READ BACK BY AND VERIFIED WITH MORENO, M. RN AT 1127 ON 07/21/2022 BY MECIAL J. Performed at [facility], [address] [place], [place] [address]   Lactic acid, plasma     Status: Abnormal   Collection Time: 07/21/22  1:11 PM  Result Value Ref Range   Lactic Acid, Venous 2.1 (HH) 0.5 - 1.9 mmol/L    Comment: CRITICAL VALUE NOTED. VALUE IS CONSISTENT WITH PREVIOUSLY REPORTED/CALLED VALUE Performed at [facility], [address] [place], [place] [address]   CBC with Differential/Platelet     Status: Abnormal   Collection Time: 07/22/22  2:48 AM  Result Value Ref Range   WBC 14.2 (H) 4.0 - 10.5 K/uL   RBC 4.16 (L) 4.22 - 5.81 MIL/uL   Hemoglobin 13.5 13.0 - 17.0 g/dL   HCT 08.640.9 57.839.0 - 46.952.0 %   MCV 98.3 80.0 - 100.0 fL   MCH 32.5 26.0 - 34.0 pg   MCHC 33.0 30.0 - 36.0 g/dL   RDW 62.912.8 52.811.5 - 41.315.5 %   Platelets 176 150 - 400 K/uL   nRBC 0.0 0.0 - 0.2 %   Neutrophils Relative % 76 %   Neutro Abs 10.8 (H) 1.7 - 7.7 K/uL   Lymphocytes Relative 15 %   Lymphs Abs 2.1 0.7 - 4.0 K/uL   Monocytes Relative 8 %   Monocytes Absolute 1.2 (H) 0.1 - 1.0 K/uL   Eosinophils Relative 0 %    Eosinophils Absolute 0.0 0.0 - 0.5 K/uL   Basophils Relative 0 %   Basophils Absolute 0.0 0.0 -  0.1 K/uL   Immature Granulocytes 1 %   Abs Immature Granulocytes 0.08 (H) 0.00 - 0.07 K/uL    Comment: Performed at [facility], [address] [place], [place] [address]  Basic metabolic panel     Status: Abnormal   Collection Time: 07/22/22  2:48 AM  Result Value Ref Range   Sodium 139 135 - 145 mmol/L   Potassium 3.6 3.5 - 5.1 mmol/L   Chloride 102 98 - 111 mmol/L   CO2 29 22 - 32 mmol/L   Glucose, Bld 145 (H) 70 - 99 mg/dL    Comment: Glucose reference range applies only to samples taken after fasting for at least 8 hours.   BUN 23 8 - 23 mg/dL   Creatinine, Ser 6.04 0.61 - 1.24 mg/dL   Calcium 8.4 (L) 8.9 - 10.3 mg/dL   GFR, Estimated >54 >09 mL/min    Comment: (NOTE) Calculated using the CKD-EPI Creatinine Equation (2021)    Anion gap 8 5 - 15    Comment: Performed at [facility], [address] [place], [place] [address]    Imaging / Studies: DG CHEST PORT 1 VIEW  Result Date: 07/21/2022 CLINICAL DATA:  Altered mental status and urinary frequency. Laparoscopic appendectomy 07/20/2022. EXAM: PORTABLE CHEST 1 VIEW COMPARISON:  07/20/2022 FINDINGS: Lungs are hypoinflated with mild hazy prominence of the pulmonary vasculature suggesting mild vascular congestion. No lobar consolidation or effusion. Mild stable cardiomegaly. Remainder of the exam is unchanged. IMPRESSION: Mild stable cardiomegaly with suggestion of mild vascular congestion. Electronically Signed   By: Elberta Fortis M.D.   On: 07/21/2022 10:02   ECHOCARDIOGRAM COMPLETE  Result Date: 07/21/2022    ECHOCARDIOGRAM REPORT   Patient Name:   LATRELL POTEMPA Date of Exam: 07/21/2022 Medical Rec #:  [id number]       Height:       63.0 in Accession #:    [id number]      Weight:       161.6 lb Date of Birth:  04-28-1935       BSA:          1.766 m Patient Age:    87 years        BP:            97/64 mmHg Patient Gender: M               HR:           102 bpm. Exam Location:  Inpatient Procedure: 2D Echo, Cardiac Doppler, Color Doppler and Intracardiac            Opacification Agent Indications:    Dyspnea R06.00  History:        Patient has no prior history of Echocardiogram examinations.                 Risk Factors:Hypertension and Dyslipidemia. Acute hypoxic                 respiratory failure. Acute encephalopathy. Sepsis secondary to                 acute appendicitis s/p appendectomy. Hypothyroidism.  Sonographer:    Leta Jungling RDCS Referring Phys: [id number] TIMOTHY S OPYD  Sonographer Comments: Suboptimal apical window and suboptimal parasternal window. IMPRESSIONS  1. Left ventricular ejection fraction, by estimation, is 65 to 70%. The left ventricle has normal function. The left ventricle has no regional wall motion abnormalities. Left ventricular diastolic function could not be  evaluated.  2. Right ventricular systolic function is normal. The right ventricular size is normal.  3. The mitral valve is normal in structure. No evidence of mitral valve regurgitation. No evidence of mitral stenosis.  4. The aortic valve is tricuspid. Aortic valve regurgitation is not visualized. Aortic valve sclerosis/calcification is present, without any evidence of aortic stenosis.  5. Aortic dilatation noted. There is mild dilatation of the aortic root, measuring 39 mm.  6. The inferior vena cava is normal in size with greater than 50% respiratory variability, suggesting right atrial pressure of 3 mmHg. FINDINGS  Left Ventricle: Left ventricular ejection fraction, by estimation, is 65 to 70%. The left ventricle has normal function. The left ventricle has no regional wall motion abnormalities. Definity contrast agent was given IV to delineate the left ventricular  endocardial borders. The left ventricular internal cavity size was normal in size. There is no left ventricular hypertrophy. Left ventricular diastolic  function could not be evaluated. Right Ventricle: The right ventricular size is normal. No increase in right ventricular wall thickness. Right ventricular systolic function is normal. Left Atrium: Left atrial size was normal in size. Right Atrium: Right atrial size was normal in size. Pericardium: There is no evidence of pericardial effusion. Mitral Valve: The mitral valve is normal in structure. No evidence of mitral valve regurgitation. No evidence of mitral valve stenosis. Tricuspid Valve: The tricuspid valve is normal in structure. Tricuspid valve regurgitation is not demonstrated. No evidence of tricuspid stenosis. Aortic Valve: The aortic valve is tricuspid. Aortic valve regurgitation is not visualized. Aortic valve sclerosis/calcification is present, without any evidence of aortic stenosis. Pulmonic Valve: The pulmonic valve was normal in structure. Pulmonic valve regurgitation is not visualized. No evidence of pulmonic stenosis. Aorta: Aortic dilatation noted. There is mild dilatation of the aortic root, measuring 39 mm. Venous: The inferior vena cava is normal in size with greater than 50% respiratory variability, suggesting right atrial pressure of 3 mmHg. IAS/Shunts: No atrial level shunt detected by color flow Doppler.  LEFT VENTRICLE PLAX 2D LVIDd:         4.30 cm LVIDs:         3.20 cm LV PW:         1.00 cm LV IVS:        1.10 cm LVOT diam:     2.20 cm LV SV:         48 LV SV Index:   27 LVOT Area:     3.80 cm  LV Volumes (MOD) LV vol d, MOD A2C: 47.0 ml LV vol d, MOD A4C: 59.3 ml LV vol s, MOD A2C: 14.3 ml LV vol s, MOD A4C: 19.9 ml LV SV MOD A2C:     32.7 ml LV SV MOD A4C:     59.3 ml LV SV MOD BP:      37.0 ml LEFT ATRIUM             Index LA diam:        2.80 cm 1.59 cm/m LA Vol (A2C):   21.9 ml 12.40 ml/m LA Vol (A4C):   28.7 ml 16.25 ml/m LA Biplane Vol: 25.9 ml 14.66 ml/m  AORTIC VALVE LVOT Vmax:   73.80 cm/s LVOT Vmean:  48.300 cm/s LVOT VTI:    0.126 m  AORTA Ao Root diam: 3.90 cm Ao Asc  diam:  3.50 cm  SHUNTS Systemic VTI:  0.13 m Systemic Diam: 2.20 cm Armanda Magic MD Electronically signed by Armanda Magic MD Signature  Date/Time: 07/21/2022/9:03:12 AM    Final    X-ray chest PA or AP  Result Date: 07/20/2022 CLINICAL DATA:  Shortness of breath following appendectomy EXAM: CHEST  1 VIEW COMPARISON:  07/20/2022 at 0931 hours FINDINGS: Stable cardiomediastinal contours. Low lung volumes. Increasing interstitial opacities throughout both lungs. No large pleural fluid collection. No pneumothorax. IMPRESSION: Increasing interstitial opacities throughout both lungs may reflect pulmonary edema. Electronically Signed   By: Duanne Guess D.O.   On: 07/20/2022 18:35   CT Abdomen Pelvis W Contrast  Result Date: 07/20/2022 CLINICAL DATA:  Right lower quadrant pain EXAM: CT ABDOMEN AND PELVIS WITH CONTRAST TECHNIQUE: Multidetector CT imaging of the abdomen and pelvis was performed using the standard protocol following bolus administration of intravenous contrast. RADIATION DOSE REDUCTION: This exam was performed according to the departmental dose-optimization program which includes automated exposure control, adjustment of the mA and/or kV according to patient size and/or use of iterative reconstruction technique. CONTRAST:  OMNIPAQUE IOHEXOL 300 MG/ML  SOLN COMPARISON:  08/26/2020 FINDINGS: Lower chest: Tortuosity of the descending thoracic aorta. Coronary artery atherosclerosis. Hepatobiliary: Mildly decreased attenuation of the hepatic parenchyma. No focal liver lesion is identified. Unremarkable gallbladder. No hyperdense gallstone. No biliary dilatation. Pancreas: Unremarkable. No pancreatic ductal dilatation or surrounding inflammatory changes. Spleen: Normal in size without focal abnormality. Adrenals/Urinary Tract: Adrenal glands are unremarkable. Kidneys are normal, without renal calculi, solid lesion, or hydronephrosis. Mild urinary bladder wall thickening. The visualized urethra is  moderately distended with fluid along its full length with urothelial enhancement (series 6, image 75). Stomach/Bowel: The appendix is abnormally dilated and hyperenhancing with surrounding periappendiceal fat stranding and trace free fluid. No organized periappendiceal fluid collection. No extraluminal air. No dilated loops of bowel to suggest obstruction. Stomach within normal limits. No additional sites of focal bowel inflammation. Vascular/Lymphatic: Scattered aortoiliac atherosclerotic calcifications without aneurysm. No abdominopelvic lymphadenopathy. Reproductive: Prostate is unremarkable. Other: No ascites. No organized abdominopelvic fluid collection. No pneumoperitoneum. No abdominal wall hernia. 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 No new or acute bony findings. IMPRESSION: 1. Acute uncomplicated appendicitis. 2. Mild urinary bladder wall thickening. The visualized urethra is moderately distended with diffuse urothelial enhancement. Findings suspicious for urethritis and cystitis. Correlate with urinalysis. The possibility of a distal urethral stricture is also a consideration. 3. Hepatic steatosis. 4. Aortic and coronary artery atherosclerosis (ICD10-I70.0). Electronically Signed   By: Duanne Guess D.O.   On: 07/20/2022 11:02   CT Head Wo Contrast  Result Date: 07/20/2022 CLINICAL DATA:  Status post fall. EXAM: CT HEAD WITHOUT CONTRAST CT CERVICAL SPINE WITHOUT CONTRAST TECHNIQUE: Multidetector CT imaging of the head and cervical spine was performed following the standard protocol without intravenous contrast. Multiplanar CT image reconstructions of the cervical spine were also generated. RADIATION DOSE REDUCTION: This exam was performed according to the departmental dose-optimization program which includes automated exposure control, adjustment of the mA and/or kV according to patient size and/or use of iterative  reconstruction technique. COMPARISON:  02/15/2015 head CT. FINDINGS: CT HEAD FINDINGS Brain: There is no evidence for acute hemorrhage, hydrocephalus, mass lesion, or abnormal extra-axial fluid collection. No definite CT evidence for acute infarction. Diffuse loss of parenchymal volume is consistent with atrophy. Patchy low attenuation in the deep hemispheric and periventricular white matter is nonspecific, but likely reflects chronic microvascular ischemic demyelination. Vascular: No hyperdense vessel or unexpected calcification. Skull: No evidence for fracture. No worrisome lytic or sclerotic lesion. Right-sided burr holes evident. Sinuses/Orbits:  The visualized paranasal sinuses and mastoid air cells are clear. Visualized portions of the globes and intraorbital fat are unremarkable. Other: None. CT CERVICAL SPINE FINDINGS Alignment: Trace anterolisthesis of C4 on 5 and C5 on 6 is compatible with a degree of facet disease at those levels. No evidence for traumatic subluxation. Skull base and vertebrae: No acute fracture. No primary bone lesion or focal pathologic process. Soft tissues and spinal canal: No prevertebral fluid or swelling. No visible canal hematoma. Disc levels: Mild loss of disc height noted C6-7 and C7-T1. Diffuse facet osteoarthritis noted bilaterally. Upper chest: Centrilobular emphysema. Other: None. IMPRESSION: 1. No acute intracranial abnormality. 2. Atrophy with chronic small vessel white matter ischemic disease. 3. No evidence for cervical spine fracture or traumatic subluxation. 4. Degenerative changes in the cervical spine as above. Electronically Signed   By: Kennith Center M.D.   On: 07/20/2022 10:52   CT Cervical Spine Wo Contrast  Result Date: 07/20/2022 CLINICAL DATA:  Status post fall. EXAM: CT HEAD WITHOUT CONTRAST CT CERVICAL SPINE WITHOUT CONTRAST TECHNIQUE: Multidetector CT imaging of the head and cervical spine was performed following the standard protocol without intravenous  contrast. Multiplanar CT image reconstructions of the cervical spine were also generated. RADIATION DOSE REDUCTION: This exam was performed according to the departmental dose-optimization program which includes automated exposure control, adjustment of the mA and/or kV according to patient size and/or use of iterative reconstruction technique. COMPARISON:  02/15/2015 head CT. FINDINGS: CT HEAD FINDINGS Brain: There is no evidence for acute hemorrhage, hydrocephalus, mass lesion, or abnormal extra-axial fluid collection. No definite CT evidence for acute infarction. Diffuse loss of parenchymal volume is consistent with atrophy. Patchy low attenuation in the deep hemispheric and periventricular white matter is nonspecific, but likely reflects chronic microvascular ischemic demyelination. Vascular: No hyperdense vessel or unexpected calcification. Skull: No evidence for fracture. No worrisome lytic or sclerotic lesion. Right-sided burr holes evident. Sinuses/Orbits: The visualized paranasal sinuses and mastoid air cells are clear. Visualized portions of the globes and intraorbital fat are unremarkable. Other: None. CT CERVICAL SPINE FINDINGS Alignment: Trace anterolisthesis of C4 on 5 and C5 on 6 is compatible with a degree of facet disease at those levels. No evidence for traumatic subluxation. Skull base and vertebrae: No acute fracture. No primary bone lesion or focal pathologic process. Soft tissues and spinal canal: No prevertebral fluid or swelling. No visible canal hematoma. Disc levels: Mild loss of disc height noted C6-7 and C7-T1. Diffuse facet osteoarthritis noted bilaterally. Upper chest: Centrilobular emphysema. Other: None. IMPRESSION: 1. No acute intracranial abnormality. 2. Atrophy with chronic small vessel white matter ischemic disease. 3. No evidence for cervical spine fracture or traumatic subluxation. 4. Degenerative changes in the cervical spine as above. Electronically Signed   By: Kennith Center  M.D.   On: 07/20/2022 10:52   DG Chest 2 View  Result Date: 07/20/2022 CLINICAL DATA:  Suspected sepsis EXAM: CHEST - 2 VIEW COMPARISON:  Chest x-ray dated December 24, 2010 FINDINGS: Cardiac and mediastinal contours are within normal limits for technique. Low lung volumes with hypoventilatory changes. No definite focal consolidation. No large pleural effusion or pneumothorax. IMPRESSION: Low lung volumes with hypoventilatory changes. No definite focal consolidation. Electronically Signed   By: Allegra Lai M.D.   On: 07/20/2022 09:45    Medications / Allergies: per chart  Antibiotics: Anti-infectives (From admission, onward)    Start     Dose/Rate Route Frequency Ordered Stop   07/21/22 0930  piperacillin-tazobactam (ZOSYN) IVPB 3.375 g  3.375 g 12.5 mL/hr over 240 Minutes Intravenous Every 8 hours 07/21/22 0839 07/26/22 0559   07/20/22 2030  ceFEPIme (MAXIPIME) 2 g in sodium chloride 0.9 % 100 mL IVPB  Status:  Discontinued       See Hyperspace for full Linked Orders Report.   2 g 200 mL/hr over 30 Minutes Intravenous Every 8 hours 07/20/22 1938 07/21/22 0839   07/20/22 2000  metroNIDAZOLE (FLAGYL) IVPB 500 mg  Status:  Discontinued       See Hyperspace for full Linked Orders Report.   500 mg 100 mL/hr over 60 Minutes Intravenous Every 12 hours 07/20/22 1938 07/21/22 0839   07/20/22 1145  metroNIDAZOLE (FLAGYL) IVPB 500 mg        500 mg 100 mL/hr over 60 Minutes Intravenous  Once 07/20/22 1136 07/20/22 1243   07/20/22 1015  cefTRIAXone (ROCEPHIN) 2 g in sodium chloride 0.9 % 100 mL IVPB  Status:  Discontinued        2 g 200 mL/hr over 30 Minutes Intravenous  Once 07/20/22 1000 07/20/22 1007   07/20/22 1015  cefTRIAXone (ROCEPHIN) 1 g in sodium chloride 0.9 % 100 mL IVPB  Status:  Discontinued        1 g 200 mL/hr over 30 Minutes Intravenous Every hour 07/20/22 1008 07/20/22 1214   07/20/22 1000  cefTRIAXone (ROCEPHIN) 1 g in sodium chloride 0.9 % 100 mL IVPB  Status:   Discontinued        1 g 200 mL/hr over 30 Minutes Intravenous  Once 07/20/22 0950 07/20/22 1037   07/20/22 0945  ceFEPIme (MAXIPIME) 2 g in sodium chloride 0.9 % 100 mL IVPB  Status:  Discontinued        2 g 200 mL/hr over 30 Minutes Intravenous  Once 07/20/22 0943 07/20/22 0950   07/20/22 0945  vancomycin (VANCOCIN) IVPB 1000 mg/200 mL premix  Status:  Discontinued        1,000 mg 200 mL/hr over 60 Minutes Intravenous  Once 07/20/22 4259 07/20/22 0950         Note: Portions of this report may have been transcribed using voice recognition software. Every effort was made to ensure accuracy; however, inadvertent computerized transcription errors may be present.   Any transcriptional errors that result from this process are unintentional.    Ardeth Sportsman, MD, FACS, MASCRS Esophageal, Gastrointestinal & Colorectal Surgery Robotic and Minimally Invasive Surgery  [facility] [address] [place], [place] [address] [phone] Fax [phone] Main  CONTACT INFORMATION:  Weekday (9AM-5PM): Call [facility] main office at [phone]  Weeknight (5PM-9AM) or Weekend/Holiday: Check [email or web address] (password " [email or web address]") for General Surgery [facility] coverage  (Please, do not use SecureChat as it is not reliable communication to reach operating surgeons for immediate patient care)      07/22/2022  8:21 AM

## 2022-07-22 NOTE — Evaluation (Signed)
Occupational Therapy Evaluation Patient Details Name: [name] MRN: [id number] DOB: Jan 27, 1935 Today's Date: 07/22/2022   History of Present Illness [name] is a pleasant 87 y.o. male with past medical history of hypertension, hypothyroidism, hyperlipidemia, nephrolithiasis, and SDH s/p evacuation in 2016, now presenting to the emergency department with lethargy, confusion, and fall.  Patient was admitted by surgery and taken to the OR  on 9/8 for laparoscopic appendectomy where he was found to have acute gangrenous perforated appendicitis.  He emerged from anesthesia hypertensive, tachycardic, wheezing, and hypoxic.  He had a chest x-ray concerning for developing pulmonary edema, was given 40 mg IV Lasix, Xopenex, and transferred to the ICU.   Clinical Impression   Mr. [name] is an 87 year old man admitted to hospital with above medical history and presents with generalized weakness, decreased activity tolerance, impaired balance and pain. Patient needing min assist to ambulate with walker just to transfer to chair and increased assistance with ADLs including max assist for LB ADLs and toileting. Family reports history of memory deficits at baseline. Patient is typically independent with rolling walker at home. Patient will benefit from skilled OT services while in hospital to improve deficits and learn compensatory strategies as needed in order to return to PLOF.         Recommendations for follow up therapy are one component of a multi-disciplinary discharge planning process, led by the attending physician.  Recommendations may be updated based on patient status, additional functional criteria and insurance authorization.   Follow Up Recommendations  Home health OT    Assistance Recommended at Discharge Frequent or constant Supervision/Assistance  Patient can return home with the following A little help with walking and/or transfers;A lot of help with  bathing/dressing/bathroom;Assistance with cooking/housework;Direct supervision/assist for financial management;Assist for transportation;Help with stairs or ramp for entrance;Direct supervision/assist for medications management    Functional Status Assessment  Patient has had a recent decline in their functional status and demonstrates the ability to make significant improvements in function in a reasonable and predictable amount of time.  Equipment Recommendations  BSC/3in1    Recommendations for Other Services       Precautions / Restrictions Precautions Precautions: Fall      Mobility Bed Mobility Overal bed mobility: Needs Assistance Bed Mobility: Supine to Sit     Supine to sit: Min assist     General bed mobility comments: Min assist and tactile cues to guide him to perform log roll technique to edge of bed.    Transfers Overall transfer level: Needs assistance Equipment used: Rolling walker (2 wheels) Transfers: Sit to/from Stand, Bed to chair/wheelchair/BSC Sit to Stand: Min assist, +2 safety/equipment     Step pivot transfers: Min assist, +2 safety/equipment     General transfer comment: Able to take a couple of steps to recilner with walker. Has some wheezing noted wtih breathing but o2 sat normal. Fatigued quickly as he began to push walker too far forward and leaving his feet behind.      Balance Overall balance assessment: Needs assistance Sitting-balance support: No upper extremity supported, Feet supported Sitting balance-Leahy Scale: Fair     Standing balance support: Reliant on assistive device for balance Standing balance-Leahy Scale: Poor                             ADL either performed or assessed with clinical judgement   ADL Overall ADL's : Needs assistance/impaired Eating/Feeding: Set up;Sitting  Grooming: Set up;Sitting   Upper Body Bathing: Moderate assistance;Sitting   Lower Body Bathing: Maximal assistance;Sit to/from  stand   Upper Body Dressing : Moderate assistance;Sitting   Lower Body Dressing: Maximal assistance;Sit to/from stand   Toilet Transfer: Minimal assistance;BSC/3in1;Rolling walker (2 wheels)   Toileting- Clothing Manipulation and Hygiene: Maximal assistance;Sit to/from stand       Functional mobility during ADLs: Minimal assistance;Rolling walker (2 wheels);+2 for safety/equipment       Vision   Vision Assessment?: No apparent visual deficits     Perception     Praxis      Pertinent Vitals/Pain Pain Assessment Pain Assessment: Faces Faces Pain Scale: Hurts even more Pain Location: abdomen Pain Descriptors / Indicators: Grimacing, Aching Pain Intervention(s): Monitored during session, RN gave pain meds during session     Hand Dominance Right   Extremity/Trunk Assessment Upper Extremity Assessment Upper Extremity Assessment: Overall WFL for tasks assessed   Lower Extremity Assessment Lower Extremity Assessment: Defer to PT evaluation   Cervical / Trunk Assessment Cervical / Trunk Assessment: Normal   Communication Communication Communication: Prefers language other than English   Cognition Arousal/Alertness: Awake/alert Behavior During Therapy: WFL for tasks assessed/performed Overall Cognitive Status: Difficult to assess                                 General Comments: Hx of memory deficits per daughter     General Comments       Exercises     Shoulder Instructions      Home Living Family/patient expects to be discharged to:: Private residence Living Arrangements: Spouse/significant other Available Help at Discharge: Available 24 hours/day;Family (wife 24/7 assistance and other family members intermittently in and out during the day) Type of Home: House Home Access: Level entry     Home Layout: One level     Bathroom Shower/Tub: Teacher, early years/pre: Standard     Home Equipment: Conservation officer, nature (2 wheels)           Prior Functioning/Environment Prior Level of Function : Independent/Modified Independent             Mobility Comments: uses a walker ADLs Comments: independent with ADLs        OT Problem List: Decreased strength;Impaired balance (sitting and/or standing);Decreased activity tolerance;Decreased knowledge of use of DME or AE;Decreased safety awareness;Decreased cognition;Cardiopulmonary status limiting activity;Pain;Increased edema      OT Treatment/Interventions: Self-care/ADL training;DME and/or AE instruction;Therapeutic activities;Patient/family education;Balance training    OT Goals(Current goals can be found in the care plan section) Acute Rehab OT Goals Patient Stated Goal: go home at discharge OT Goal Formulation: With family Time For Goal Achievement: 08/05/22 Potential to Achieve Goals: Fair  OT Frequency: Min 2X/week    Co-evaluation              AM-PAC OT "6 Clicks" Daily Activity     Outcome Measure Help from another person eating meals?: None Help from another person taking care of personal grooming?: A Little Help from another person toileting, which includes using toliet, bedpan, or urinal?: A Lot Help from another person bathing (including washing, rinsing, drying)?: A Lot Help from another person to put on and taking off regular upper body clothing?: A Little Help from another person to put on and taking off regular lower body clothing?: A Lot 6 Click Score: 16   End of Session Equipment Utilized During Treatment: Rolling walker (  2 wheels) Nurse Communication: Patient requests pain meds  Activity Tolerance: Patient limited by pain Patient left: in chair;with call bell/phone within reach;with family/visitor present;with chair alarm set  OT Visit Diagnosis: Muscle weakness (generalized) (M62.81);Pain                Time: 7209-4709 OT Time Calculation (min): 19 min Charges:  OT General Charges $OT Visit: 1 Visit OT Evaluation $OT Eval Low  Complexity: 1 Low  Donnella Sham, OTR/L Acute Care Rehab Services  Office [phone]   Kelli Churn 07/22/2022, 12:17 PM

## 2022-07-23 ENCOUNTER — Inpatient Hospital Stay (HOSPITAL_COMMUNITY): Payer: Medicare Other

## 2022-07-23 DIAGNOSIS — K35891 Other acute appendicitis without perforation, with gangrene: Secondary | ICD-10-CM | POA: Diagnosis not present

## 2022-07-23 DIAGNOSIS — J9601 Acute respiratory failure with hypoxia: Secondary | ICD-10-CM | POA: Diagnosis not present

## 2022-07-23 DIAGNOSIS — G9340 Encephalopathy, unspecified: Secondary | ICD-10-CM | POA: Diagnosis not present

## 2022-07-23 LAB — BASIC METABOLIC PANEL
Anion gap: 7 (ref 5–15)
BUN: 21 mg/dL (ref 8–23)
CO2: 29 mmol/L (ref 22–32)
Calcium: 8.6 mg/dL — ABNORMAL LOW (ref 8.9–10.3)
Chloride: 103 mmol/L (ref 98–111)
Creatinine, Ser: 0.86 mg/dL (ref 0.61–1.24)
GFR, Estimated: >60 mL/min (ref >60)
Glucose, Bld: 138 mg/dL — ABNORMAL HIGH (ref 70–99)
Potassium: 4.2 mmol/L (ref 3.5–5.1)
Sodium: 139 mmol/L (ref 135–145)

## 2022-07-23 LAB — CULTURE, BLOOD (ROUTINE X 2)

## 2022-07-23 LAB — FOLATE: Folate: 27.7 ng/mL (ref >5.9)

## 2022-07-23 MED ORDER — CYANOCOBALAMIN 1000 MCG/ML IJ SOLN
1000.0000 ug | Freq: Once | INTRAMUSCULAR | Status: AC
Start: 2022-07-23 — End: 2022-07-23
  Administered 2022-07-23: 1000 ug via INTRAMUSCULAR
  Filled 2022-07-23: qty 1

## 2022-07-23 MED ORDER — ENOXAPARIN SODIUM 40 MG/0.4ML IJ SOSY
40.0000 mg | PREFILLED_SYRINGE | INTRAMUSCULAR | Status: DC
  Administered 2022-07-23 – 2022-07-31 (×9): 40 mg via SUBCUTANEOUS
  Filled 2022-07-23 (×9): qty 0.4

## 2022-07-23 MED ORDER — BOOST PLUS PO LIQD
237.0000 mL | Freq: Three times a day (TID) | ORAL | Status: DC
  Administered 2022-07-23 – 2022-07-31 (×11): 237 mL via ORAL
  Filled 2022-07-23 (×26): qty 237

## 2022-07-23 NOTE — Progress Notes (Signed)
MRI Brain ordered on this patient. Patient speaks [place]. Interpreter Services contacted but no one on service speaks this language.  Dr. Beatriz Chancellor (radiologist) reviewed films from 07/20/22 & 07/21/22. Cleared for MRI by Dr. Mosetta Putt on 07/23/2022.

## 2022-07-23 NOTE — Progress Notes (Signed)
Subjective No acute events. He denies any nausea or vomiting. No flatus/BM yet either. Tolerating liquids without nausea. Pain controlled  Objective: Vital signs in last 24 hours: Temp:  [97 F (36.1 C)-98.6 F (37 C)] 97.9 F (36.6 C) (09/11 0540) Pulse Rate:  [79-109] 84 (09/11 0540) Resp:  [14-22] 19 (09/11 0540) BP: (116-165)/(71-121) 134/81 (09/11 0540) SpO2:  [90 %-99 %] 99 % (09/11 0540) Last BM Date : 07/21/22  Intake/Output from previous day: 09/10 0701 - 09/11 0700 In: 141.4 [IV Piggyback:141.4] Out: 450 [Urine:450] Intake/Output this shift: No intake/output data recorded.  Gen: NAD, comfortable CV: RRR Pulm: Normal work of breathing Abd: Soft, not significantly tender nor significantly distended. No rebound/guarding. Incisions c/d/I without erythema nor drainage. Ext: SCDs in place  Lab Results: CBC  Recent Labs    07/21/22 1041 07/22/22 0248  WBC 15.0* 14.2*  HGB 14.8 13.5  HCT 43.7 40.9  PLT 160 176   BMET Recent Labs    07/22/22 0248 07/23/22 0424  NA 139 139  K 3.6 4.2  CL 102 103  CO2 29 29  GLUCOSE 145* 138*  BUN 23 21  CREATININE 0.91 0.86  CALCIUM 8.4* 8.6*   PT/INR Recent Labs    07/20/22 0900  LABPROT 16.5*  INR 1.3*   ABG No results for input(s): "PHART", "HCO3" in the last 72 hours.  Invalid input(s): "PCO2", "PO2"  Studies/Results:  Anti-infectives: Anti-infectives (From admission, onward)    Start     Dose/Rate Route Frequency Ordered Stop   07/21/22 0930  piperacillin-tazobactam (ZOSYN) IVPB 3.375 g        3.375 g 12.5 mL/hr over 240 Minutes Intravenous Every 8 hours 07/21/22 0839 07/26/22 0559   07/20/22 2030  ceFEPIme (MAXIPIME) 2 g in sodium chloride 0.9 % 100 mL IVPB  Status:  Discontinued       See Hyperspace for full Linked Orders Report.   2 g 200 mL/hr over 30 Minutes Intravenous Every 8 hours 07/20/22 1938 07/21/22 0839   07/20/22 2000  metroNIDAZOLE (FLAGYL) IVPB 500 mg  Status:  Discontinued        See Hyperspace for full Linked Orders Report.   500 mg 100 mL/hr over 60 Minutes Intravenous Every 12 hours 07/20/22 1938 07/21/22 0839   07/20/22 1145  metroNIDAZOLE (FLAGYL) IVPB 500 mg        500 mg 100 mL/hr over 60 Minutes Intravenous  Once 07/20/22 1136 07/20/22 1243   07/20/22 1015  cefTRIAXone (ROCEPHIN) 2 g in sodium chloride 0.9 % 100 mL IVPB  Status:  Discontinued        2 g 200 mL/hr over 30 Minutes Intravenous  Once 07/20/22 1000 07/20/22 1007   07/20/22 1015  cefTRIAXone (ROCEPHIN) 1 g in sodium chloride 0.9 % 100 mL IVPB  Status:  Discontinued        1 g 200 mL/hr over 30 Minutes Intravenous Every hour 07/20/22 1008 07/20/22 1214   07/20/22 1000  cefTRIAXone (ROCEPHIN) 1 g in sodium chloride 0.9 % 100 mL IVPB  Status:  Discontinued        1 g 200 mL/hr over 30 Minutes Intravenous  Once 07/20/22 0950 07/20/22 1037   07/20/22 0945  ceFEPIme (MAXIPIME) 2 g in sodium chloride 0.9 % 100 mL IVPB  Status:  Discontinued        2 g 200 mL/hr over 30 Minutes Intravenous  Once 07/20/22 0943 07/20/22 0950   07/20/22 0945  vancomycin (VANCOCIN) IVPB 1000 mg/200 mL premix  Status:  Discontinued        1,000 mg 200 mL/hr over 60 Minutes Intravenous  Once 07/20/22 0943 07/20/22 0950        Assessment/Plan: Patient Active Problem List   Diagnosis Date Noted   Non-English speaking patient 07/21/2022   History of subdural hematoma 07/21/2022   History of fall 07/21/2022   Acute appendicitis 07/21/2022   Acute gangrenous appendicitis s/p lap appendectomy 07/20/2022 07/20/2022   Acute encephalopathy 07/20/2022   Severe sepsis (HCC) 07/20/2022   Acute respiratory failure with hypoxia (HCC) 07/20/2022   Acute pulmonary edema (HCC) 07/20/2022   Hypothyroidism 02/23/2015   Ataxia 06/02/2013   Falling 06/02/2013   Hyperlipidemia 06/02/2013   Hypertension 02/28/2012   s/p Procedure(s): APPENDECTOMY LAPAROSCOPIC 07/20/2022  - Continue liquid based diet as present today; awaiting  resolution of not unexpected postoperative ileus - no flatus/bm yet. - Mobilizing with therapies - Medicine following for assistance in comorbidity management - appreciate their help greatly. -ppx: SCDs, lovenox   LOS: 2 days   Marin Olp, MD [facility], [facility]

## 2022-07-23 NOTE — Progress Notes (Signed)
PROGRESS NOTE    [name]  SPQ:[id number] DOB: [birth year] DOA: 07/20/2022 PCP: Deatra James, MD    Chief Complaint  Patient presents with   Altered Mental Status   Urinary Frequency    Brief Narrative:    [name] is a pleasant 87 y.o. male with past medical history of hypertension, hypothyroidism, hyperlipidemia, nephrolithiasis, and SDH s/p evacuation in 2016, now presenting to the emergency department with lethargy, confusion, and fall. He had a fall overnight while trying to use the bathroom, was confused and unable to get up on his own. His wife was unable to help him up from the floor and called EMS.  He was found to be saturating in the 80s on room air, was placed on nonrebreather, and brought into the ED. Patient was admitted by surgery and taken to the OR  on 9/8 for laparoscopic appendectomy where he was found to have acute gangrenous perforated appendicitis.  He emerged from anesthesia hypertensive, tachycardic, wheezing, and hypoxic.  He had a chest x-ray concerning for developing pulmonary edema, was given 40 mg IV Lasix, Xopenex, and transferred to the ICU.  Pt seen and examined at bedside.     Assessment & Plan:   Principal Problem:   Acute gangrenous appendicitis s/p lap appendectomy 07/20/2022 Active Problems:   Ataxia   Hypertension   Hyperlipidemia   Hypothyroidism   Acute encephalopathy   Severe sepsis (HCC)   Acute respiratory failure with hypoxia (HCC)   Acute pulmonary edema (HCC)   Non-English speaking patient   History of subdural hematoma   History of fall   Acute appendicitis  Acute respiratory failure with hypoxia initially requiring 10 lit of [place] oxygen Appears to have much improved.  Repeated CXR on 9/9 , with some congestion.  2 doses of lasix given.   Echocardiogram showed preserved LVEF.  Currently on 3 lit of [place] oxygen. On exam pt has wheezing, continue with duonebs. Family denies any smoking history.  No history of asthma or  restricted air way disease.  Repeat CXR in am.    Acute metabolic encephalopathy:  Patient had increased fatigue and was less verbal for ~1 wk before become delirious and lethargy.  TSH wnl, b12 levels low normal .  UA Is negative. RPR is Negative. Initial CT head does not show any acute intracranial abnormality.  Except  for the language barrier, he has answered simple questions. But he continues to have generalized weakness and intermittent confusion. Get MRI Brain for further evaluation.     Hypertension:  Bp parameters are are optimal.     Hypothyroidism;  Resume synthroid.    Sepsis secondary to perforated gangrenous appendicitis S/p cholecystectomy. Sepsis physiology has resolved. BP parameters have normalized. He is weaned off oxygen. He is good to be transferred to med surg later today .  Currently on broad spectrum IV antibiotics. Continue the same as per recommendations from surgery.  Gen surgery on board and primary.  Improving leukocytosis.  Will get dietary consult.  PT eval recommending home health PT.   Bilateral foot pain: going on for many years.  Suspect neuropathy.  Would wait until he is back to baseline before starting him on Neurontin.     Staphylococcus species in 1 out of 4 blood culture bottles Likely a contaminant.  Patient already on zosyn.  Monitor.     Antimicrobials:  Antibiotics Given (last 72 hours)     Date/Time Action Medication Dose Rate   07/20/22 2019 New Bag/Given  ceFEPIme (MAXIPIME) 2 g in sodium chloride 0.9 % 100 mL IVPB 2 g 200 mL/hr   07/20/22 2055 New Bag/Given   metroNIDAZOLE (FLAGYL) IVPB 500 mg 500 mg 100 mL/hr   07/21/22 0510 New Bag/Given   ceFEPIme (MAXIPIME) 2 g in sodium chloride 0.9 % 100 mL IVPB 2 g 200 mL/hr   07/21/22 0855 New Bag/Given   piperacillin-tazobactam (ZOSYN) IVPB 3.375 g 3.375 g 12.5 mL/hr   07/21/22 1432 New Bag/Given   piperacillin-tazobactam (ZOSYN) IVPB 3.375 g 3.375 g 12.5 mL/hr    07/21/22 2222 New Bag/Given   piperacillin-tazobactam (ZOSYN) IVPB 3.375 g 3.375 g 12.5 mL/hr   07/22/22 0321 New Bag/Given   piperacillin-tazobactam (ZOSYN) IVPB 3.375 g 3.375 g 12.5 mL/hr   07/22/22 1446 New Bag/Given   piperacillin-tazobactam (ZOSYN) IVPB 3.375 g 3.375 g 12.5 mL/hr   07/22/22 2136 New Bag/Given   piperacillin-tazobactam (ZOSYN) IVPB 3.375 g 3.375 g 12.5 mL/hr   07/23/22 0625 New Bag/Given   piperacillin-tazobactam (ZOSYN) IVPB 3.375 g 3.375 g 12.5 mL/hr         Subjective: Abd pain is improving, no nausea,   Objective: Vitals:   07/22/22 2249 07/23/22 0228 07/23/22 0540 07/23/22 1201  BP: (!) 125/94 121/81 134/81 (!) 103/56  Pulse: 93 83 84 91  Resp: (!) 22 18 19 18   Temp: 98.2 F (36.8 C) (!) 97.4 F (36.3 C) 97.9 F (36.6 C) 97.7 F (36.5 C)  TempSrc: Oral Oral Oral Oral  SpO2: 97% 99% 99% 97%  Weight:      Height:        Intake/Output Summary (Last 24 hours) at 07/23/2022 1421 Last data filed at 07/23/2022 1203 Gross per 24 hour  Intake 215.12 ml  Output 600 ml  Net -384.88 ml    Filed Weights   07/20/22 0847 07/20/22 2015  Weight: 77.1 kg 73.3 kg    Examination:  General exam: Appears calm and comfortable  Respiratory system: Clear to auscultation. Respiratory effort normal. Cardiovascular system: S1 & S2 heard, RRR.  No pedal edema. Gastrointestinal system: Abdomen is nondistended, soft and nontender.  Normal bowel sounds heard. Central nervous system: Alert and oriented to person o nly.  Extremities: Symmetric 5 x 5 power. Skin: No rashes, Psychiatry: Mood & affect appropriate.       Data Reviewed: I have personally reviewed following labs and imaging studies  CBC: Recent Labs  Lab 07/20/22 0900 07/21/22 1041 07/22/22 0248  WBC 12.8* 15.0* 14.2*  NEUTROABS 10.6* 12.3* 10.8*  HGB 15.6 14.8 13.5  HCT 45.6 43.7 40.9  MCV 95.0 97.3 98.3  PLT 172 160 176     Basic Metabolic Panel: Recent Labs  Lab 07/20/22 0900  07/21/22 1041 07/22/22 0248 07/23/22 0424  NA 136 139 139 139  K 4.4 4.3 3.6 4.2  CL 101 102 102 103  CO2 25 29 29 29   GLUCOSE 186* 170* 145* 138*  BUN 10 15 23 21   CREATININE 0.79 0.88 0.91 0.86  CALCIUM 9.3 8.8* 8.4* 8.6*     GFR: Estimated Creatinine Clearance: 56.4 mL/min (by C-G formula based on SCr of 0.86 mg/dL).  Liver Function Tests: Recent Labs  Lab 07/20/22 0900 07/21/22 1041  AST 26 44*  ALT 18 22  ALKPHOS 32* 28*  BILITOT 1.5* 1.5*  PROT 7.6 7.6  ALBUMIN 4.1 3.8     CBG: Recent Labs  Lab 07/20/22 0924  GLUCAP 174*      Recent Results (from the past 240 hour(s))  Culture, blood (  Routine x 2)     Status: None (Preliminary result)   Collection Time: 07/20/22  9:00 AM   Specimen: BLOOD  Result Value Ref Range Status   Specimen Description   Final    BLOOD RIGHT ANTECUBITAL Performed at [facility], [address] [place], [place] [address]    Special Requests   Final    BOTTLES DRAWN AEROBIC AND ANAEROBIC Blood Culture results may not be optimal due to an excessive volume of blood received in culture bottles Performed at [facility], [address], [place], [place] [address]    Culture   Final    NO GROWTH 3 DAYS Performed at [facility], [address] [place], [place] [address]    Report Status PENDING  Incomplete  Urine Culture     Status: None   Collection Time: 07/20/22  9:00 AM   Specimen: In/Out Cath Urine  Result Value Ref Range Status   Specimen Description   Final    IN/OUT CATH URINE Performed at [facility], [address] [place], [place] [address]    Special Requests   Final    NONE Performed at [facility], [address] [place], [place] [address]    Culture   Final    NO GROWTH Performed at [facility], [address], [place], [place] [address]    Report Status 07/21/2022 FINAL  Final  Culture, blood (Routine x 2)      Status: Abnormal   Collection Time: 07/20/22  9:12 AM   Specimen: BLOOD  Result Value Ref Range Status   Specimen Description BLOOD LEFT ANTECUBITAL  Final   Special Requests   Final    BOTTLES DRAWN AEROBIC AND ANAEROBIC Blood Culture results may not be optimal due to an excessive volume of blood received in culture bottles   Culture  Setup Time   Final    GRAM POSITIVE COCCI IN CLUSTERS ANAEROBIC BOTTLE ONLY CRITICAL RESULT CALLED TO, READ BACK BY AND VERIFIED WITH: PHARMD E WILLIAMSON [id number] AT 1752 BY CM    Culture (A)  Final    STAPHYLOCOCCUS AURICULARIS THE SIGNIFICANCE OF ISOLATING THIS ORGANISM FROM A SINGLE SET OF BLOOD CULTURES WHEN MULTIPLE SETS ARE DRAWN IS UNCERTAIN. PLEASE NOTIFY THE MICROBIOLOGY DEPARTMENT WITHIN ONE WEEK IF SPECIATION AND SENSITIVITIES ARE REQUIRED.    Report Status 07/23/2022 FINAL  Final  Blood Culture ID Panel (Reflexed)     Status: Abnormal   Collection Time: 07/20/22  9:12 AM  Result Value Ref Range Status   Enterococcus faecalis NOT DETECTED NOT DETECTED Final   Enterococcus Faecium NOT DETECTED NOT DETECTED Final   Listeria monocytogenes NOT DETECTED NOT DETECTED Final   Staphylococcus species DETECTED (A) NOT DETECTED Final    Comment: CRITICAL RESULT CALLED TO, READ BACK BY AND VERIFIED WITH: PHARMD E WILLIAMSON [id number] AT 1752 BY CM    Staphylococcus aureus (BCID) NOT DETECTED NOT DETECTED Final   Staphylococcus epidermidis NOT DETECTED NOT DETECTED Final   Staphylococcus lugdunensis NOT DETECTED NOT DETECTED Final   Streptococcus species NOT DETECTED NOT DETECTED Final   Streptococcus agalactiae NOT DETECTED NOT DETECTED Final   Streptococcus pneumoniae NOT DETECTED NOT DETECTED Final   Streptococcus pyogenes NOT DETECTED NOT DETECTED Final   A.calcoaceticus-baumannii NOT DETECTED NOT DETECTED Final   Bacteroides fragilis NOT DETECTED NOT DETECTED Final   Enterobacterales NOT DETECTED NOT DETECTED Final   Enterobacter cloacae complex NOT  DETECTED NOT  DETECTED Final   Escherichia coli NOT DETECTED NOT DETECTED Final   Klebsiella aerogenes NOT DETECTED NOT DETECTED Final   Klebsiella oxytoca NOT DETECTED NOT DETECTED Final   Klebsiella pneumoniae NOT DETECTED NOT DETECTED Final   Proteus species NOT DETECTED NOT DETECTED Final   Salmonella species NOT DETECTED NOT DETECTED Final   Serratia marcescens NOT DETECTED NOT DETECTED Final   Haemophilus influenzae NOT DETECTED NOT DETECTED Final   Neisseria meningitidis NOT DETECTED NOT DETECTED Final   Pseudomonas aeruginosa NOT DETECTED NOT DETECTED Final   Stenotrophomonas maltophilia NOT DETECTED NOT DETECTED Final   Candida albicans NOT DETECTED NOT DETECTED Final   Candida auris NOT DETECTED NOT DETECTED Final   Candida glabrata NOT DETECTED NOT DETECTED Final   Candida krusei NOT DETECTED NOT DETECTED Final   Candida parapsilosis NOT DETECTED NOT DETECTED Final   Candida tropicalis NOT DETECTED NOT DETECTED Final   Cryptococcus neoformans/gattii NOT DETECTED NOT DETECTED Final    Comment: Performed at [facility], [address] [place], [place] [address]  Resp Panel by RT-PCR (Flu A&B, Covid) Anterior Nasal Swab     Status: None   Collection Time: 07/20/22  9:21 AM   Specimen: Anterior Nasal Swab  Result Value Ref Range Status   SARS Coronavirus 2 by RT PCR NEGATIVE NEGATIVE Final    Comment: (NOTE) SARS-CoV-2 target nucleic acids are NOT DETECTED.  The SARS-CoV-2 RNA is generally detectable in upper respiratory specimens during the acute phase of infection. The lowest concentration of SARS-CoV-2 viral copies this assay can detect is 138 copies/mL. A negative result does not preclude SARS-Cov-2 infection and should not be used as the sole basis for treatment or other patient management decisions. A negative result may occur with  improper specimen collection/handling, submission of specimen other than nasopharyngeal swab, presence of viral mutation(s) within  the areas targeted by this assay, and inadequate number of viral copies(<138 copies/mL). A negative result must be combined with clinical observations, patient history, and epidemiological information. The expected result is Negative.  Fact Sheet for Patients:  BloggerCourse.comhttps://www.fda.gov/media/152166/download  Fact Sheet for Healthcare Providers:  SeriousBroker.ithttps://www.fda.gov/media/152162/download  This test is no t yet approved or cleared by the [place] FDA and  has been authorized for detection and/or diagnosis of SARS-CoV-2 by FDA under an Emergency Use Authorization (EUA). This EUA will remain  in effect (meaning this test can be used) for the duration of the COVID-19 declaration under Section 564(b)(1) of the Act, 21 U.S.C.section 360bbb-3(b)(1), unless the authorization is terminated  or revoked sooner.       Influenza A by PCR NEGATIVE NEGATIVE Final   Influenza B by PCR NEGATIVE NEGATIVE Final    Comment: (NOTE) The Xpert Xpress SARS-CoV-2/FLU/RSV plus assay is intended as an aid in the diagnosis of influenza from Nasopharyngeal swab specimens and should not be used as a sole basis for treatment. Nasal washings and aspirates are unacceptable for Xpert Xpress SARS-CoV-2/FLU/RSV testing.  Fact Sheet for Patients: BloggerCourse.comhttps://www.fda.gov/media/152166/download  Fact Sheet for Healthcare Providers: SeriousBroker.ithttps://www.fda.gov/media/152162/download  This test is not yet approved or cleared by the [place] FDA and has been authorized for detection and/or diagnosis of SARS-CoV-2 by FDA under an Emergency Use Authorization (EUA). This EUA will remain in effect (meaning this test can be used) for the duration of the COVID-19 declaration under Section 564(b)(1) of the Act, 21 U.S.C. section 360bbb-3(b)(1), unless the authorization is terminated or revoked.  Performed at [facility], [address] [place],  [place] [address]   MRSA Next Gen by PCR, Nasal     Status:  None   Collection Time: 07/20/22  7:40 PM   Specimen: Nasal Mucosa; Nasal Swab  Result Value Ref Range Status   MRSA by PCR Next Gen NOT DETECTED NOT DETECTED Final    Comment: (NOTE) The GeneXpert MRSA Assay (FDA approved for NASAL specimens only), is one component of a comprehensive MRSA colonization surveillance program. It is not intended to diagnose MRSA infection nor to guide or monitor treatment for MRSA infections. Test performance is not FDA approved in patients less than 72 years old. Performed at [facility], [address], [place], [place] [address]          Radiology Studies: No results found.      Scheduled Meds:  acetaminophen  1,000 mg Oral TID   aspirin EC  81 mg Oral q AM   Chlorhexidine Gluconate Cloth  6 each Topical Q0600   enoxaparin (LOVENOX) injection  40 mg Subcutaneous Q24H   lactose free nutrition  237 mL Oral TID WC   levothyroxine  50 mcg Oral QAC breakfast   lip balm   Topical BID   losartan  25 mg Oral Daily   multivitamin with minerals  1 tablet Oral Daily   polycarbophil  625 mg Oral BID   Continuous Infusions:  sodium chloride Stopped (07/21/22 1432)   methocarbamol (ROBAXIN) IV     ondansetron (ZOFRAN) IV     piperacillin-tazobactam (ZOSYN)  IV 3.375 g (07/23/22 0625)     LOS: 2 days        Kathlen Mody, MD [facility]   To contact the attending provider between 7A-7P or the covering provider during after hours 7P-7A, please log into the web site [email or web address] and access using universal [place] password for that web site. If you do not have the password, please call the hospital operator.  07/23/2022, 2:21 PM

## 2022-07-23 NOTE — Progress Notes (Signed)
Initial Nutrition Assessment  INTERVENTION:   -Boost Plus TID- Each supplement provides 360kcal and 14g protein.     NUTRITION DIAGNOSIS:   Increased nutrient needs related to post-op healing as evidenced by estimated needs.  GOAL:   Patient will meet greater than or equal to 90% of their needs  MONITOR:   PO intake, Supplement acceptance, Labs, Weight trends, I & O's  REASON FOR ASSESSMENT:   Consult Assessment of nutrition requirement/status  ASSESSMENT:   87 y.o. male with past medical history of hypertension, hypothyroidism, hyperlipidemia, nephrolithiasis, and SDH s/p evacuation in 2016, now presenting to the emergency department with lethargy, confusion, and fall.Patient was admitted by surgery and taken to the OR  on 9/8 for laparoscopic appendectomy where he was found to have acute gangrenous perforated appendicitis.  Patient in room, grandson at bedside who was able to translate for me. Pt directed me to speak with grandson anyway. Per grandson, pt has been tolerating liquids, not really interested in anything but chocolate milk this morning for breakfast. Pt has been consuming lentil soups and other liquids with no issue.  Pt does get choked up sometimes with solid intakes. Currently on pureed diet. Pt does tolerate dairy and consumes it. Pt follows a vegetarian diet. Will order Boost Plus as this is available in chocolate.   Per weight records, no significant weight changes noted.  Medications: Vitamin B-12, Multivitamin with minerals daily, Fibercon  Labs reviewed.  NUTRITION - FOCUSED PHYSICAL EXAM:  No depletions noted.  Diet Order:   Diet Order             DIET - DYS 1 Room service appropriate? Yes; Fluid consistency: Thin  Diet effective now                   EDUCATION NEEDS:   Education needs have been addressed  Skin:  Skin Assessment: Skin Integrity Issues: Skin Integrity Issues:: Incisions Incisions: 9/8 abdomen  Last BM:   9/9  Height:   Ht Readings from Last 1 Encounters:  07/20/22 5\' 3"  (1.6 m)    Weight:   Wt Readings from Last 1 Encounters:  07/20/22 73.3 kg    BMI:  Body mass index is 28.63 kg/m.  Estimated Nutritional Needs:   Kcal:  1800-2000  Protein:  80-95g  Fluid:  2L/day   09/23/2022, MS, RD, LDN Inpatient Clinical Dietitian Contact information available via Amion

## 2022-07-24 ENCOUNTER — Inpatient Hospital Stay (HOSPITAL_COMMUNITY): Payer: Medicare Other

## 2022-07-24 DIAGNOSIS — K35891 Other acute appendicitis without perforation, with gangrene: Secondary | ICD-10-CM | POA: Diagnosis not present

## 2022-07-24 DIAGNOSIS — J9601 Acute respiratory failure with hypoxia: Secondary | ICD-10-CM | POA: Diagnosis not present

## 2022-07-24 DIAGNOSIS — G9340 Encephalopathy, unspecified: Secondary | ICD-10-CM | POA: Diagnosis not present

## 2022-07-24 LAB — CBC WITH DIFFERENTIAL/PLATELET
Abs Immature Granulocytes: 0.04 10*3/uL (ref 0.00–0.07)
Basophils Absolute: 0.1 10*3/uL (ref 0.0–0.1)
Basophils Relative: 1 %
Eosinophils Absolute: 0.3 10*3/uL (ref 0.0–0.5)
Eosinophils Relative: 4 %
HCT: 42.3 % (ref 39.0–52.0)
Hemoglobin: 14 g/dL (ref 13.0–17.0)
Immature Granulocytes: 1 %
Lymphocytes Relative: 30 %
Lymphs Abs: 2.4 10*3/uL (ref 0.7–4.0)
MCH: 32.1 pg (ref 26.0–34.0)
MCHC: 33.1 g/dL (ref 30.0–36.0)
MCV: 97 fL (ref 80.0–100.0)
Monocytes Absolute: 0.8 10*3/uL (ref 0.1–1.0)
Monocytes Relative: 10 %
Neutro Abs: 4.2 10*3/uL (ref 1.7–7.7)
Neutrophils Relative %: 54 %
Platelets: 208 10*3/uL (ref 150–400)
RBC: 4.36 MIL/uL (ref 4.22–5.81)
RDW: 12.3 % (ref 11.5–15.5)
WBC: 7.8 10*3/uL (ref 4.0–10.5)
nRBC: 0 % (ref 0.0–0.2)

## 2022-07-24 LAB — BASIC METABOLIC PANEL
Anion gap: 7 (ref 5–15)
BUN: 16 mg/dL (ref 8–23)
CO2: 32 mmol/L (ref 22–32)
Calcium: 8.8 mg/dL — ABNORMAL LOW (ref 8.9–10.3)
Chloride: 101 mmol/L (ref 98–111)
Creatinine, Ser: 0.74 mg/dL (ref 0.61–1.24)
GFR, Estimated: >60 mL/min (ref >60)
Glucose, Bld: 150 mg/dL — ABNORMAL HIGH (ref 70–99)
Potassium: 4.5 mmol/L (ref 3.5–5.1)
Sodium: 140 mmol/L (ref 135–145)

## 2022-07-24 LAB — SURGICAL PATHOLOGY

## 2022-07-24 LAB — LACTIC ACID, PLASMA: Lactic Acid, Venous: 1.2 mmol/L (ref 0.5–1.9)

## 2022-07-24 MED ORDER — FUROSEMIDE 10 MG/ML IJ SOLN
40.0000 mg | Freq: Once | INTRAMUSCULAR | Status: AC
Start: 2022-07-24 — End: 2022-07-24
  Administered 2022-07-24: 40 mg via INTRAVENOUS
  Filled 2022-07-24: qty 4

## 2022-07-24 NOTE — Progress Notes (Signed)
PROGRESS NOTE    [name]  BPZ:[id number] DOB: 13-May-1935 DOA: 07/20/2022 PCP: Deatra James, MD    Chief Complaint  Patient presents with   Altered Mental Status   Urinary Frequency    Brief Narrative:    [name] is a pleasant 87 y.o. male with past medical history of hypertension, hypothyroidism, hyperlipidemia, nephrolithiasis, and SDH s/p evacuation in 2016, now presenting to the emergency department with lethargy, confusion, and fall. He had a fall overnight while trying to use the bathroom, was confused and unable to get up on his own. His wife was unable to help him up from the floor and called EMS.  He was found to be saturating in the 80s on room air, was placed on nonrebreather, and brought into the ED. Patient was admitted by surgery and taken to the OR  on 9/8 for laparoscopic appendectomy where he was found to have acute gangrenous perforated appendicitis.  He emerged from anesthesia hypertensive, tachycardic, wheezing, and hypoxic.  He had a chest x-ray concerning for developing pulmonary edema, was given 40 mg IV Lasix, Xopenex, and transferred to the ICU.  Pt seen and examined at bedside.   Still wheezing on exam.   Assessment & Plan:   Principal Problem:   Acute gangrenous appendicitis s/p lap appendectomy 07/20/2022 Active Problems:   Ataxia   Hypertension   Hyperlipidemia   Hypothyroidism   Acute encephalopathy   Severe sepsis (HCC)   Acute respiratory failure with hypoxia (HCC)   Acute pulmonary edema (HCC)   Non-English speaking patient   History of subdural hematoma   History of fall   Acute appendicitis  Acute respiratory failure with hypoxia initially requiring 10 lit of [place] oxygen Appears to have much improved.  Repeated CXR shows persistent vascular congestion, with wheezing on exam.  Echocardiogram showed preserved LVEF.  Currently on 3 lit of [place] oxygen. On exam pt has wheezing, continue with duonebs. Family denies any smoking history.   No history of asthma or restricted air way disease.  A dose of IV lasix 40 mg given ordered today.    Acute metabolic encephalopathy:  Patient had increased fatigue and was less verbal for ~1 wk before become delirious and lethargy.  TSH wnl, b12 levels low normal .  UA Is negative. RPR is Negative. Initial CT head does not show any acute intracranial abnormality.  Except  for the language barrier, he has answered simple questions. But he continues to have generalized weakness and intermittent confusion. Get MRI Brain shows chronic microvascular ischemic changes.     Hypertension:  Bp parameters are elevated this morning.     Hypothyroidism;  Resume synthroid.    Sepsis secondary to perforated gangrenous appendicitis S/p cholecystectomy. Sepsis physiology has resolved. BP parameters have normalized.  Currently on broad spectrum IV antibiotics. Continue the same as per recommendations from surgery.  Improving leukocytosis. Advance diet as tolerated.  PT eval recommending home health PT.   Bilateral foot pain: going on for many years.  Suspect neuropathy.  Would wait until he is back to baseline before starting him on Neurontin.     Staphylococcus species in 1 out of 4 blood culture bottles Likely a contaminant.  Patient already on zosyn.  Monitor.     Antimicrobials:  Antibiotics Given (last 72 hours)     Date/Time Action Medication Dose Rate   07/21/22 1432 New Bag/Given   piperacillin-tazobactam (ZOSYN) IVPB 3.375 g 3.375 g 12.5 mL/hr   07/21/22 2222 New Bag/Given  piperacillin-tazobactam (ZOSYN) IVPB 3.375 g 3.375 g 12.5 mL/hr   07/22/22 1610 New Bag/Given   piperacillin-tazobactam (ZOSYN) IVPB 3.375 g 3.375 g 12.5 mL/hr   07/22/22 1446 New Bag/Given   piperacillin-tazobactam (ZOSYN) IVPB 3.375 g 3.375 g 12.5 mL/hr   07/22/22 2136 New Bag/Given   piperacillin-tazobactam (ZOSYN) IVPB 3.375 g 3.375 g 12.5 mL/hr   07/23/22 0625 New Bag/Given    piperacillin-tazobactam (ZOSYN) IVPB 3.375 g 3.375 g 12.5 mL/hr   07/23/22 1431 New Bag/Given   piperacillin-tazobactam (ZOSYN) IVPB 3.375 g 3.375 g 12.5 mL/hr   07/23/22 2119 New Bag/Given   piperacillin-tazobactam (ZOSYN) IVPB 3.375 g 3.375 g 12.5 mL/hr   07/24/22 1002 New Bag/Given   piperacillin-tazobactam (ZOSYN) IVPB 3.375 g 3.375 g 12.5 mL/hr         Subjective: Some nausea, sob , BM today.   Objective: Vitals:   07/23/22 1201 07/23/22 2111 07/24/22 0548 07/24/22 1335  BP: (!) 103/56 117/76 (!) 144/106 (!) 142/99  Pulse: 91 96 92 86  Resp: 18 16 16 18   Temp: 97.7 F (36.5 C) 98.3 F (36.8 C) 98.3 F (36.8 C) 97.7 F (36.5 C)  TempSrc: Oral Oral Oral Oral  SpO2: 97% 92% (!) 89% 98%  Weight:      Height:        Intake/Output Summary (Last 24 hours) at 07/24/2022 1340 Last data filed at 07/24/2022 1000 Gross per 24 hour  Intake 398.96 ml  Output 1025 ml  Net -626.04 ml    Filed Weights   07/20/22 0847 07/20/22 2015  Weight: 77.1 kg 73.3 kg    Examination:  General exam: Appears calm and comfortable  Respiratory system: Clear to auscultation. Respiratory effort normal. Cardiovascular system: S1 & S2 heard, RRR. No JVD, murmurs, rubs, gallops or clicks. No pedal edema. Gastrointestinal system: Abdomen is soft, mildly tender, distended,  Normal bowel sounds heard. Central nervous system: Alert and oriented to person and place. Extremities: Symmetric 5 x 5 power. Skin: No rashes, lesions or ulcers Psychiatry:  Mood & affect appropriate.        Data Reviewed: I have personally reviewed following labs and imaging studies  CBC: Recent Labs  Lab 07/20/22 0900 07/21/22 1041 07/22/22 0248 07/24/22 0433  WBC 12.8* 15.0* 14.2* 7.8  NEUTROABS 10.6* 12.3* 10.8* 4.2  HGB 15.6 14.8 13.5 14.0  HCT 45.6 43.7 40.9 42.3  MCV 95.0 97.3 98.3 97.0  PLT 172 160 176 208     Basic Metabolic Panel: Recent Labs  Lab 07/20/22 0900 07/21/22 1041 07/22/22 0248  07/23/22 0424 07/24/22 0433  NA 136 139 139 139 140  K 4.4 4.3 3.6 4.2 4.5  CL 101 102 102 103 101  CO2 25 29 29 29  32  GLUCOSE 186* 170* 145* 138* 150*  BUN 10 15 23 21 16   CREATININE 0.79 0.88 0.91 0.86 0.74  CALCIUM 9.3 8.8* 8.4* 8.6* 8.8*     GFR: Estimated Creatinine Clearance: 60.6 mL/min (by C-G formula based on SCr of 0.74 mg/dL).  Liver Function Tests: Recent Labs  Lab 07/20/22 0900 07/21/22 1041  AST 26 44*  ALT 18 22  ALKPHOS 32* 28*  BILITOT 1.5* 1.5*  PROT 7.6 7.6  ALBUMIN 4.1 3.8     CBG: Recent Labs  Lab 07/20/22 0924  GLUCAP 174*      Recent Results (from the past 240 hour(s))  Culture, blood (Routine x 2)     Status: None (Preliminary result)   Collection Time: 07/20/22  9:00 AM  Specimen: BLOOD  Result Value Ref Range Status   Specimen Description   Final    BLOOD RIGHT ANTECUBITAL Performed at [facility], [address] [place], [place] [address]    Special Requests   Final    BOTTLES DRAWN AEROBIC AND ANAEROBIC Blood Culture results may not be optimal due to an excessive volume of blood received in culture bottles Performed at [facility], [address] [place], [place] [address]    Culture   Final    NO GROWTH 4 DAYS Performed at [facility], [address] [place], [place] [address]    Report Status PENDING  Incomplete  Urine Culture     Status: None   Collection Time: 07/20/22  9:00 AM   Specimen: In/Out Cath Urine  Result Value Ref Range Status   Specimen Description   Final    IN/OUT CATH URINE Performed at [facility], [address] [place], [place] [address]    Special Requests   Final    NONE Performed at [facility], [address] [place], [place] [address]    Culture   Final    NO GROWTH Performed at [facility], [address] [place], [place] [address]    Report Status 07/21/2022 FINAL  Final  Culture, blood  (Routine x 2)     Status: Abnormal   Collection Time: 07/20/22  9:12 AM   Specimen: BLOOD  Result Value Ref Range Status   Specimen Description BLOOD LEFT ANTECUBITAL  Final   Special Requests   Final    BOTTLES DRAWN AEROBIC AND ANAEROBIC Blood Culture results may not be optimal due to an excessive volume of blood received in culture bottles   Culture  Setup Time   Final    GRAM POSITIVE COCCI IN CLUSTERS ANAEROBIC BOTTLE ONLY CRITICAL RESULT CALLED TO, READ BACK BY AND VERIFIED WITH: PHARMD E WILLIAMSON [id number] AT 1752 BY CM    Culture (A)  Final    STAPHYLOCOCCUS AURICULARIS THE SIGNIFICANCE OF ISOLATING THIS ORGANISM FROM A SINGLE SET OF BLOOD CULTURES WHEN MULTIPLE SETS ARE DRAWN IS UNCERTAIN. PLEASE NOTIFY THE MICROBIOLOGY DEPARTMENT WITHIN ONE WEEK IF SPECIATION AND SENSITIVITIES ARE REQUIRED.    Report Status 07/23/2022 FINAL  Final  Blood Culture ID Panel (Reflexed)     Status: Abnormal   Collection Time: 07/20/22  9:12 AM  Result Value Ref Range Status   Enterococcus faecalis NOT DETECTED NOT DETECTED Final   Enterococcus Faecium NOT DETECTED NOT DETECTED Final   Listeria monocytogenes NOT DETECTED NOT DETECTED Final   Staphylococcus species DETECTED (A) NOT DETECTED Final    Comment: CRITICAL RESULT CALLED TO, READ BACK BY AND VERIFIED WITH: PHARMD E WILLIAMSON [id number] AT 1752 BY CM    Staphylococcus aureus (BCID) NOT DETECTED NOT DETECTED Final   Staphylococcus epidermidis NOT DETECTED NOT DETECTED Final   Staphylococcus lugdunensis NOT DETECTED NOT DETECTED Final   Streptococcus species NOT DETECTED NOT DETECTED Final   Streptococcus agalactiae NOT DETECTED NOT DETECTED Final   Streptococcus pneumoniae NOT DETECTED NOT DETECTED Final   Streptococcus pyogenes NOT DETECTED NOT DETECTED Final   A.calcoaceticus-baumannii NOT DETECTED NOT DETECTED Final   Bacteroides fragilis NOT DETECTED NOT DETECTED Final   Enterobacterales NOT DETECTED NOT DETECTED Final   Enterobacter  cloacae complex NOT DETECTED NOT DETECTED Final   Escherichia coli NOT DETECTED NOT DETECTED Final   Klebsiella aerogenes NOT DETECTED NOT DETECTED Final  Klebsiella oxytoca NOT DETECTED NOT DETECTED Final   Klebsiella pneumoniae NOT DETECTED NOT DETECTED Final   Proteus species NOT DETECTED NOT DETECTED Final   Salmonella species NOT DETECTED NOT DETECTED Final   Serratia marcescens NOT DETECTED NOT DETECTED Final   Haemophilus influenzae NOT DETECTED NOT DETECTED Final   Neisseria meningitidis NOT DETECTED NOT DETECTED Final   Pseudomonas aeruginosa NOT DETECTED NOT DETECTED Final   Stenotrophomonas maltophilia NOT DETECTED NOT DETECTED Final   Candida albicans NOT DETECTED NOT DETECTED Final   Candida auris NOT DETECTED NOT DETECTED Final   Candida glabrata NOT DETECTED NOT DETECTED Final   Candida krusei NOT DETECTED NOT DETECTED Final   Candida parapsilosis NOT DETECTED NOT DETECTED Final   Candida tropicalis NOT DETECTED NOT DETECTED Final   Cryptococcus neoformans/gattii NOT DETECTED NOT DETECTED Final    Comment: Performed at [facility], [address] [place], [place] [address]  Resp Panel by RT-PCR (Flu A&B, Covid) Anterior Nasal Swab     Status: None   Collection Time: 07/20/22  9:21 AM   Specimen: Anterior Nasal Swab  Result Value Ref Range Status   SARS Coronavirus 2 by RT PCR NEGATIVE NEGATIVE Final    Comment: (NOTE) SARS-CoV-2 target nucleic acids are NOT DETECTED.  The SARS-CoV-2 RNA is generally detectable in upper respiratory specimens during the acute phase of infection. The lowest concentration of SARS-CoV-2 viral copies this assay can detect is 138 copies/mL. A negative result does not preclude SARS-Cov-2 infection and should not be used as the sole basis for treatment or other patient management decisions. A negative result may occur with  improper specimen collection/handling, submission of specimen other than nasopharyngeal swab, presence of viral  mutation(s) within the areas targeted by this assay, and inadequate number of viral copies(<138 copies/mL). A negative result must be combined with clinical observations, patient history, and epidemiological information. The expected result is Negative.  Fact Sheet for Patients:  BloggerCourse.com  Fact Sheet for Healthcare Providers:  SeriousBroker.it  This test is no t yet approved or cleared by the [place] FDA and  has been authorized for detection and/or diagnosis of SARS-CoV-2 by FDA under an Emergency Use Authorization (EUA). This EUA will remain  in effect (meaning this test can be used) for the duration of the COVID-19 declaration under Section 564(b)(1) of the Act, 21 U.S.C.section 360bbb-3(b)(1), unless the authorization is terminated  or revoked sooner.       Influenza A by PCR NEGATIVE NEGATIVE Final   Influenza B by PCR NEGATIVE NEGATIVE Final    Comment: (NOTE) The Xpert Xpress SARS-CoV-2/FLU/RSV plus assay is intended as an aid in the diagnosis of influenza from Nasopharyngeal swab specimens and should not be used as a sole basis for treatment. Nasal washings and aspirates are unacceptable for Xpert Xpress SARS-CoV-2/FLU/RSV testing.  Fact Sheet for Patients: BloggerCourse.com  Fact Sheet for Healthcare Providers: SeriousBroker.it  This test is not yet approved or cleared by the [place] FDA and has been authorized for detection and/or diagnosis of SARS-CoV-2 by FDA under an Emergency Use Authorization (EUA). This EUA will remain in effect (meaning this test can be used) for the duration of the COVID-19 declaration under Section 564(b)(1) of the Act, 21 U.S.C. section 360bbb-3(b)(1), unless the authorization is terminated or revoked.  Performed at [facility], [address] [place], [place] [address]   MRSA Next Gen by PCR,  Nasal     Status: None   Collection Time: 07/20/22  7:40 PM   Specimen: Nasal Mucosa; Nasal Swab  Result Value Ref Range Status   MRSA by PCR Next Gen NOT DETECTED NOT DETECTED Final    Comment: (NOTE) The GeneXpert MRSA Assay (FDA approved for NASAL specimens only), is one component of a comprehensive MRSA colonization surveillance program. It is not intended to diagnose MRSA infection nor to guide or monitor treatment for MRSA infections. Test performance is not FDA approved in patients less than 58 years old. Performed at [facility], [address] [place], [place] [address]          Radiology Studies: DG CHEST PORT 1 VIEW  Result Date: 07/24/2022 CLINICAL DATA:  Follow-up exam. EXAM: PORTABLE CHEST 1 VIEW COMPARISON:  07/21/2022 FINDINGS: Heart size and mediastinal contours are stable. Unchanged elevation of the right hemidiaphragm. Lung volumes are improved from the previous exam. Mild pulmonary vascular congestion. No airspace opacities. IMPRESSION: 1. Improved aeration to the lungs compared with previous exam. 2. Mild pulmonary vascular congestion. Electronically Signed   By: Signa Kell M.D.   On: 07/24/2022 10:56   MR BRAIN WO CONTRAST  Result Date: 07/23/2022 CLINICAL DATA:  Altered mental status EXAM: MRI HEAD WITHOUT CONTRAST TECHNIQUE: Multiplanar, multiecho pulse sequences of the brain and surrounding structures were obtained without intravenous contrast. COMPARISON:  02/12/2015 FINDINGS: Brain: No acute infarct, mass effect or extra-axial collection. Multifocal central predominant chronic microhemorrhage. There is confluent hyperintense T2-weighted signal within the white matter. There is advanced atrophy. The midline structures are normal. Vascular: Major flow voids are preserved. Skull and upper cervical spine: Normal calvarium and skull base. Visualized upper cervical spine and soft tissues are normal. Sinuses/Orbits:No paranasal sinus fluid  levels or advanced mucosal thickening. No mastoid or middle ear effusion. Normal orbits. IMPRESSION: 1. No acute intracranial abnormality. 2. Advanced atrophy and chronic microvascular ischemic disease. Electronically Signed   By: Deatra Robinson M.D.   On: 07/23/2022 19:41        Scheduled Meds:  acetaminophen  1,000 mg Oral TID   aspirin EC  81 mg Oral q AM   Chlorhexidine Gluconate Cloth  6 each Topical Q0600   enoxaparin (LOVENOX) injection  40 mg Subcutaneous Q24H   furosemide  40 mg Intravenous Once   lactose free nutrition  237 mL Oral TID WC   levothyroxine  50 mcg Oral QAC breakfast   lip balm   Topical BID   losartan  25 mg Oral Daily   multivitamin with minerals  1 tablet Oral Daily   polycarbophil  625 mg Oral BID   Continuous Infusions:  sodium chloride Stopped (07/21/22 1432)   methocarbamol (ROBAXIN) IV     ondansetron (ZOFRAN) IV     piperacillin-tazobactam (ZOSYN)  IV 3.375 g (07/24/22 1002)     LOS: 3 days        Kathlen Mody, MD [facility]   To contact the attending provider between 7A-7P or the covering provider during after hours 7P-7A, please log into the web site [email or web address] and access using universal [place] password for that web site. If you do not have the password, please call the hospital operator.  07/24/2022, 1:40 PM

## 2022-07-24 NOTE — Progress Notes (Signed)
Physical Therapy Treatment Patient Details Name: [name] MRN: [id number] DOB: 1934-12-01 Today's Date: 07/24/2022   History of Present Illness 87 yo male admitted with acute appendicitis s/p appendectomy 9/8, acute respiratory failure, sepsis. Hx of SDH s/p evacuation 2016, hypothyroidism, falls, ataxia.    PT Comments    Pt assisted with ambulating in hallway and required seated rest break.  Pt's spouse present and assisted with simple translations.  Pt's Spo2 92% on room air during ambulation.  Pt requiring min-mod assist for mobility at this time.  If spouse is not able to provide this, pt may need SNF.    Recommendations for follow up therapy are one component of a multi-disciplinary discharge planning process, led by the attending physician.  Recommendations may be updated based on patient status, additional functional criteria and insurance authorization.  Follow Up Recommendations  Home health PT     Assistance Recommended at Discharge Frequent or constant Supervision/Assistance  Patient can return home with the following A little help with walking and/or transfers;A little help with bathing/dressing/bathroom   Equipment Recommendations  None recommended by PT    Recommendations for Other Services       Precautions / Restrictions Precautions Precautions: Fall Precaution Comments: monitor O2     Mobility  Bed Mobility Overal bed mobility: Needs Assistance Bed Mobility: Sit to Supine, Supine to Sit     Supine to sit: Max assist Sit to supine: Mod assist   General bed mobility comments: assist for trunk and LEs over EOB, increased time and effort, allowed pt to struggle a little so he would attempt to self assist as much as possible; assist required for LEs onto bed upon returning to supine    Transfers Overall transfer level: Needs assistance Equipment used: Rolling walker (2 wheels) Transfers: Sit to/from Stand Sit to Stand: Mod assist, Min assist, +2  safety/equipment           General transfer comment: visual cues for hand placement, initially mod assist however progressed to min assist; performed standing twice from recliner    Ambulation/Gait Ambulation/Gait assistance: Min assist, +2 safety/equipment Gait Distance (Feet): 35 Feet Assistive device: Rolling walker (2 wheels) Gait Pattern/deviations: Step-through pattern, Decreased stride length, Decreased dorsiflexion - left, Knee hyperextension - left, Decreased weight shift to left       General Gait Details: pt required seated rest break after 15 feet and then a little encouragement to continue ambulating; assist for weakness and stabilizing as pt has above gait deviations (spouse present and did not appear alarmed by gait so likely pt's baseline deviations); followed closely with recliner for safety   Stairs             Wheelchair Mobility    Modified Rankin (Stroke Patients Only)       Balance Overall balance assessment: Needs assistance         Standing balance support: Reliant on assistive device for balance, During functional activity, Bilateral upper extremity supported Standing balance-Leahy Scale: Poor                              Cognition Arousal/Alertness: Awake/alert Behavior During Therapy: WFL for tasks assessed/performed Overall Cognitive Status: Difficult to assess                                 General Comments: spouse present and assisted with basic English interpretering;  RN reports ipad interpreter has not been useful for pt        Exercises      General Comments        Pertinent Vitals/Pain Pain Assessment Pain Assessment: Faces Faces Pain Scale: No hurt Pain Intervention(s): Repositioned, Monitored during session    Home Living                          Prior Function            PT Goals (current goals can now be found in the care plan section) Progress towards PT goals:  Progressing toward goals    Frequency    Min 3X/week      PT Plan Current plan remains appropriate    Co-evaluation              AM-PAC PT "6 Clicks" Mobility   Outcome Measure  Help needed turning from your back to your side while in a flat bed without using bedrails?: A Little Help needed moving from lying on your back to sitting on the side of a flat bed without using bedrails?: A Lot Help needed moving to and from a bed to a chair (including a wheelchair)?: A Lot Help needed standing up from a chair using your arms (e.g., wheelchair or bedside chair)?: A Lot Help needed to walk in hospital room?: A Lot Help needed climbing 3-5 steps with a railing? : Total 6 Click Score: 12    End of Session   Activity Tolerance: Patient limited by fatigue Patient left: in bed;with call bell/phone within reach;with family/visitor present Nurse Communication: Mobility status PT Visit Diagnosis: Muscle weakness (generalized) (M62.81);Difficulty in walking, not elsewhere classified (R26.2)     Time: 3762-8315 PT Time Calculation (min) (ACUTE ONLY): 25 min  Charges:  $Gait Training: 23-37 mins                    Thomasene Mohair PT, DPT Physical Therapist Acute Rehabilitation Services Preferred contact method: Secure Chat Weekend Pager Only: [phone] Office: [phone]    Janan Halter [place] 07/24/2022, 4:05 PM

## 2022-07-24 NOTE — Progress Notes (Signed)
4 Days Post-Op  Subjective: CC: Family at bedside. Stable abdominal pain. Tolerating small amounts of liquids without n/v. Unsure of flatus. BM recorded yesterday but patient does not remember this. Checking with RN on this.   Objective: Vital signs in last 24 hours: Temp:  [97.7 F (36.5 C)-98.3 F (36.8 C)] 98.3 F (36.8 C) (09/12 0548) Pulse Rate:  [91-96] 92 (09/12 0548) Resp:  [16-18] 16 (09/12 0548) BP: (103-144)/(56-106) 144/106 (09/12 0548) SpO2:  [89 %-97 %] 89 % (09/12 0548) Last BM Date : 07/23/22  Intake/Output from previous day: 09/11 0701 - 09/12 0700 In: 519 [P.O.:360; IV Piggyback:159] Out: 950 [Urine:950] Intake/Output this shift: No intake/output data recorded.  PE: Gen:  Alert, NAD Abd: Soft, no significant distension, +BS, some ttp that seems to be around his incisions and appropriate. No rigidity or guarding. Incisions with glue intact appears well and are without drainage, bleeding, or signs of infection  Lab Results:  Recent Labs    07/22/22 0248 07/24/22 0433  WBC 14.2* 7.8  HGB 13.5 14.0  HCT 40.9 42.3  PLT 176 208   BMET Recent Labs    07/23/22 0424 07/24/22 0433  NA 139 140  K 4.2 4.5  CL 103 101  CO2 29 32  GLUCOSE 138* 150*  BUN 21 16  CREATININE 0.86 0.74  CALCIUM 8.6* 8.8*   PT/INR No results for input(s): "LABPROT", "INR" in the last 72 hours. CMP     Component Value Date/Time   NA 140 07/24/2022 0433   K 4.5 07/24/2022 0433   CL 101 07/24/2022 0433   CO2 32 07/24/2022 0433   GLUCOSE 150 (H) 07/24/2022 0433   BUN 16 07/24/2022 0433   CREATININE 0.74 07/24/2022 0433   CALCIUM 8.8 (L) 07/24/2022 0433   PROT 7.6 07/21/2022 1041   ALBUMIN 3.8 07/21/2022 1041   AST 44 (H) 07/21/2022 1041   ALT 22 07/21/2022 1041   ALKPHOS 28 (L) 07/21/2022 1041   BILITOT 1.5 (H) 07/21/2022 1041   GFRNONAA >60 07/24/2022 0433   GFRAA >60 04/28/2020 1546   Lipase     Component Value Date/Time   LIPASE 32 04/28/2020 1546     Studies/Results: MR BRAIN WO CONTRAST  Result Date: 07/23/2022 CLINICAL DATA:  Altered mental status EXAM: MRI HEAD WITHOUT CONTRAST TECHNIQUE: Multiplanar, multiecho pulse sequences of the brain and surrounding structures were obtained without intravenous contrast. COMPARISON:  02/12/2015 FINDINGS: Brain: No acute infarct, mass effect or extra-axial collection. Multifocal central predominant chronic microhemorrhage. There is confluent hyperintense T2-weighted signal within the white matter. There is advanced atrophy. The midline structures are normal. Vascular: Major flow voids are preserved. Skull and upper cervical spine: Normal calvarium and skull base. Visualized upper cervical spine and soft tissues are normal. Sinuses/Orbits:No paranasal sinus fluid levels or advanced mucosal thickening. No mastoid or middle ear effusion. Normal orbits. IMPRESSION: 1. No acute intracranial abnormality. 2. Advanced atrophy and chronic microvascular ischemic disease. Electronically Signed   By: Deatra Robinson M.D.   On: 07/23/2022 19:41    Anti-infectives: Anti-infectives (From admission, onward)    Start     Dose/Rate Route Frequency Ordered Stop   07/21/22 0930  piperacillin-tazobactam (ZOSYN) IVPB 3.375 g        3.375 g 12.5 mL/hr over 240 Minutes Intravenous Every 8 hours 07/21/22 0839 07/26/22 0559   07/20/22 2030  ceFEPIme (MAXIPIME) 2 g in sodium chloride 0.9 % 100 mL IVPB  Status:  Discontinued  See Hyperspace for full Linked Orders Report.   2 g 200 mL/hr over 30 Minutes Intravenous Every 8 hours 07/20/22 1938 07/21/22 0839   07/20/22 2000  metroNIDAZOLE (FLAGYL) IVPB 500 mg  Status:  Discontinued       See Hyperspace for full Linked Orders Report.   500 mg 100 mL/hr over 60 Minutes Intravenous Every 12 hours 07/20/22 1938 07/21/22 0839   07/20/22 1145  metroNIDAZOLE (FLAGYL) IVPB 500 mg        500 mg 100 mL/hr over 60 Minutes Intravenous  Once 07/20/22 1136 07/20/22 1243   07/20/22  1015  cefTRIAXone (ROCEPHIN) 2 g in sodium chloride 0.9 % 100 mL IVPB  Status:  Discontinued        2 g 200 mL/hr over 30 Minutes Intravenous  Once 07/20/22 1000 07/20/22 1007   07/20/22 1015  cefTRIAXone (ROCEPHIN) 1 g in sodium chloride 0.9 % 100 mL IVPB  Status:  Discontinued        1 g 200 mL/hr over 30 Minutes Intravenous Every hour 07/20/22 1008 07/20/22 1214   07/20/22 1000  cefTRIAXone (ROCEPHIN) 1 g in sodium chloride 0.9 % 100 mL IVPB  Status:  Discontinued        1 g 200 mL/hr over 30 Minutes Intravenous  Once 07/20/22 0950 07/20/22 1037   07/20/22 0945  ceFEPIme (MAXIPIME) 2 g in sodium chloride 0.9 % 100 mL IVPB  Status:  Discontinued        2 g 200 mL/hr over 30 Minutes Intravenous  Once 07/20/22 0943 07/20/22 0950   07/20/22 0945  vancomycin (VANCOCIN) IVPB 1000 mg/200 mL premix  Status:  Discontinued        1,000 mg 200 mL/hr over 60 Minutes Intravenous  Once 07/20/22 0943 07/20/22 0950        Assessment/Plan POD 4 s/p Laparoscopic Appendectomy for Acute gangrenous perforated appendicitis - Cont abx 5d post op - On dys1 diet. Just taking liquids yesterday without n/v. Checking with RN if he truly is having BM's. Once tolerating diet better and having bowel function would consider advancing to Vegetarian diet. Would go slow, not unexpected to have ileus.  - Mobilize, therapies following and recommending HH  FEN - Dys1, IVF per [facility] VTE - SCDs, Lovenox  ID - Zosyn 9/9 - 9/14. WBC normalized. Lactic cleared. Afebrile. No tachycardia or systolic hypotension.  Foley - None, Voiding  - Per [facility] - Acute Respiratory Failure - s/p IV lasix x 2. Duoneb. Weaned to RA 1/4 BC with Staph - already on abx, ? Contaminant  Hypothyroidism - home Levothyroxine  Hypertension    LOS: 3 days    Jacinto Halim , [facility] Surgery 07/24/2022, 9:50 AM Please see Amion for pager number during day hours 7:00am-4:30pm

## 2022-07-24 NOTE — Progress Notes (Signed)
Upon entering the room blood was found on patient bed and all over his gown, and found out that it was his IV he pulled it out again.Patient was assisted to the bedside commode gave him a bath and assisted back to his bed.

## 2022-07-24 NOTE — Progress Notes (Signed)
Patient pulled his IV, this RN was able to insert gauge 22 on the patient right hand. We will continue to monitor.

## 2022-07-25 DIAGNOSIS — J9601 Acute respiratory failure with hypoxia: Secondary | ICD-10-CM | POA: Diagnosis not present

## 2022-07-25 DIAGNOSIS — G9340 Encephalopathy, unspecified: Secondary | ICD-10-CM | POA: Diagnosis not present

## 2022-07-25 DIAGNOSIS — K35891 Other acute appendicitis without perforation, with gangrene: Secondary | ICD-10-CM | POA: Diagnosis not present

## 2022-07-25 LAB — CULTURE, BLOOD (ROUTINE X 2): Culture: NO GROWTH
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
## 2022-07-25 NOTE — Discharge Instructions (Signed)
[facility]  Please arrive at least 30 min before your appointment to complete your check in paperwork.  If you are unable to arrive 30 min prior to your appointment time we may have to cancel or reschedule you. LAPAROSCOPIC SURGERY: POST OP INSTRUCTIONS Always review your discharge instruction sheet given to you by the facility where your surgery was performed. IF YOU HAVE DISABILITY OR FAMILY LEAVE FORMS, YOU MUST BRING THEM TO THE OFFICE FOR PROCESSING.   DO NOT GIVE THEM TO YOUR DOCTOR.  PAIN CONTROL  First take acetaminophen (Tylenol) AND/or ibuprofen (Advil) to control your pain after surgery.  Follow directions on package.  Taking acetaminophen (Tylenol) and/or ibuprofen (Advil) regularly after surgery will help to control your pain and lower the amount of prescription pain medication you may need.  You should not take more than 4,000 mg (4 grams) of acetaminophen (Tylenol) in 24 hours.  You should not take ibuprofen (Advil), aleve, motrin, naprosyn or other NSAIDS if you have a history of stomach ulcers or chronic kidney disease.  A prescription for pain medication may be given to you upon discharge.  Take your pain medication as prescribed, if you still have uncontrolled pain after taking acetaminophen (Tylenol) or ibuprofen (Advil). Use ice packs to help control pain. If you need a refill on your pain medication, please contact your pharmacy.  They will contact our office to request authorization. Prescriptions will not be filled after 5pm or on week-ends.  HOME MEDICATIONS Take your usually prescribed medications unless otherwise directed.  DIET You should follow a light diet the first few days after arrival home.  Be sure to include lots of fluids daily. Avoid fatty, fried foods.   CONSTIPATION It is common to experience some constipation after surgery and if you are taking pain medication.  Increasing fluid intake and taking a stool softener (such as Colace)  will usually help or prevent this problem from occurring.  A mild laxative (Milk of Magnesia or Miralax) should be taken according to package instructions if there are no bowel movements after 48 hours.  WOUND/INCISION CARE Most patients will experience some swelling and bruising in the area of the incisions.  Ice packs will help.  Swelling and bruising can take several days to resolve.  Unless discharge instructions indicate otherwise, follow guidelines below  STERI-STRIPS - you may remove your outer bandages 48 hours after surgery, and you may shower at that time.  You have steri-strips (small skin tapes) in place directly over the incision.  These strips should be left on the skin for 7-10 days.   DERMABOND/SKIN GLUE - you may shower in 24 hours.  The glue will flake off over the next 2-3 weeks. Any sutures or staples will be removed at the office during your follow-up visit.  ACTIVITIES You may resume regular (light) daily activities beginning the next day--such as daily self-care, walking, climbing stairs--gradually increasing activities as tolerated.  You may have sexual intercourse when it is comfortable.  Refrain from any heavy lifting or straining until approved by your doctor. You may drive when you are no longer taking prescription pain medication, you can comfortably wear a seatbelt, and you can safely maneuver your car and apply brakes.  FOLLOW-UP You should see your doctor in the office for a follow-up appointment approximately 2-3 weeks after your surgery.  You should have been given your post-op/follow-up appointment when your surgery was scheduled.  If you did not receive a post-op/follow-up appointment, make sure   that you call for this appointment within a day or two after you arrive home to insure a convenient appointment time.   WHEN TO CALL YOUR DOCTOR: Fever over 101.0 Inability to urinate Continued bleeding from incision. Increased pain, redness, or drainage from the  incision. Increasing abdominal pain  The clinic staff is available to answer your questions during regular business hours.  Please don't hesitate to call and ask to speak to one of the nurses for clinical concerns.  If you have a medical emergency, go to the nearest emergency room or call 911.  A surgeon from [facility] is always on call at the hospital. [address], [address], [place], [place]  [address] ? [address] [address], [place], [place]   [address] [phone] ? [phone] ? FAX [phone]  

## 2022-07-25 NOTE — Progress Notes (Signed)
Occupational Therapy Treatment Patient Details Name: [name] MRN: [id number] DOB: [birth year] Today's Date: 07/25/2022   History of present illness 87 yo male admitted with acute appendicitis s/p appendectomy 9/8, acute respiratory failure, sepsis. Hx of SDH s/p evacuation 2016, hypothyroidism, falls, ataxia.   OT comments  Patient not progressing towards goals. Needed mod assist x 2 to take steps and transfer to recliner. Requiring increased physical assistance, not ambulating safely enough to discharge home with current assistance. POC updated to recommend short term rehab at discharge. In room extended amount of time to talk to family in regards to POC - HH vs SNF and to answer questions. If patient's family wanted to take him home he would need 24/7 physical assistance, hospital bed and wheelchair as well as to maximize [facility] services. Currently he needs to transfer with decreased physical assistance and more safely before discharge home is a safe plan. Communication difficult with patient due to needing interpreter and patient being [facility].    Recommendations for follow up therapy are one component of a multi-disciplinary discharge planning process, led by the attending physician.  Recommendations may be updated based on patient status, additional functional criteria and insurance authorization.    Follow Up Recommendations  Skilled nursing-short term rehab (<3 hours/day)    Assistance Recommended at Discharge Frequent or constant Supervision/Assistance  Patient can return home with the following  A lot of help with bathing/dressing/bathroom;Other (comment);Direct supervision/assist for financial management;Direct supervision/assist for medications management;Two people to help with walking and/or transfers   Equipment Recommendations  BSC/3in1    Recommendations for Other Services      Precautions / Restrictions Precautions Precautions: Fall Precaution Comments: monitor  O2 Restrictions Weight Bearing Restrictions: No       Mobility Bed Mobility Overal bed mobility: Needs Assistance Bed Mobility: Rolling, Sidelying to Sit Rolling: Mod assist Sidelying to sit: Max assist            Transfers Overall transfer level: Needs assistance Equipment used: Rolling walker (2 wheels) Transfers: Sit to/from Stand, Bed to chair/wheelchair/BSC Sit to Stand: Mod assist, From elevated surface, +2 safety/equipment           General transfer comment: Mod assist from elevated bed height on first sit to stand. Unable to effectively take steps or use walker and had to be transferred to recliner with mod assist. Stood again with mod x 2 to stand a second time and attempt to take steps. Patient exhibiting difficulty motor planning steps, not using walker effectively, leaving left foot behind, fearful in standing and sitting unexpectedly.     Balance Overall balance assessment: Needs assistance Sitting-balance support: No upper extremity supported, Feet supported Sitting balance-Leahy Scale: Fair     Standing balance support: During functional activity, Reliant on assistive device for balance Standing balance-Leahy Scale: Poor                             ADL either performed or assessed with clinical judgement   ADL                                              Extremity/Trunk Assessment              Vision Patient Visual Report: No change from baseline     Perception     Praxis  Cognition Arousal/Alertness: Awake/alert Behavior During Therapy: WFL for tasks assessed/performed Overall Cognitive Status: Difficult to assess                                          Exercises      Shoulder Instructions       General Comments      Pertinent Vitals/ Pain       Pain Assessment Pain Assessment: Faces Faces Pain Scale: Hurts a little bit Pain Location: abdomen Pain Descriptors /  Indicators: Grimacing Pain Intervention(s): Monitored during session  Home Living                                          Prior Functioning/Environment              Frequency  Min 2X/week        Progress Toward Goals  OT Goals(current goals can now be found in the care plan section)  Progress towards OT goals: Not progressing toward goals - comment  Acute Rehab OT Goals OT Goal Formulation: With family Time For Goal Achievement: 08/05/22 Potential to Achieve Goals: Fair  Plan Discharge plan needs to be updated    Co-evaluation                 AM-PAC OT "6 Clicks" Daily Activity     Outcome Measure   Help from another person eating meals?: A Little Help from another person taking care of personal grooming?: A Little Help from another person toileting, which includes using toliet, bedpan, or urinal?: Total Help from another person bathing (including washing, rinsing, drying)?: A Lot Help from another person to put on and taking off regular upper body clothing?: A Lot Help from another person to put on and taking off regular lower body clothing?: Total 6 Click Score: 12    End of Session Equipment Utilized During Treatment: Rolling walker (2 wheels)  OT Visit Diagnosis: Muscle weakness (generalized) (M62.81);Pain   Activity Tolerance Patient limited by fatigue;Patient limited by pain   Patient Left in chair;with call bell/phone within reach;with family/visitor present;with chair alarm set   Nurse Communication Mobility status        Time: 9678-9381 OT Time Calculation (min): 35 min  Charges: OT General Charges $OT Visit: 1 Visit OT Treatments $Therapeutic Activity: 23-37 mins  Donnella Sham, OTR/L Acute Care Rehab Services  Office [phone]   Kelli Churn 07/25/2022, 5:00 PM

## 2022-07-25 NOTE — TOC Initial Note (Signed)
Transition of Care [facility]) - Initial/Assessment Note    Patient Details  Name: [name] MRN: [id number] Date of Birth: 1935/06/19  [facility] CM/SW Contact:    Vassie Moselle, LCSW Phone Number: 07/25/2022, 11:46 AM  Clinical Narrative:                 Met with pt and grandson in pt's room and discussed plan for home health services. Pt agreeable to this plan. HHPT/OT has been arranged through [place].     Expected Discharge Plan: [place] Barriers to Discharge: No Barriers Identified   Patient Goals and CMS Choice Patient states their goals for this hospitalization and ongoing recovery are:: To return home   Choice offered to / list presented to : Patient  Expected Discharge Plan and Services Expected Discharge Plan: [place] In-house Referral: NA Discharge Planning Services: CM Consult Post Acute Care Choice: [place] arrangements for the past 2 months: Apartment                 DME Arranged: N/A DME Agency: NA       HH Arranged: PT, OT HH Agency: [place] Date [place]: 07/25/22 Time HH Agency Contacted: 6 Representative spoke with at [place]: Amy  Prior Living Arrangements/Services Living arrangements for the past 2 months: Apartment Lives with:: Spouse Patient language and need for interpreter reviewed:: Yes Do you feel safe going back to the place where you live?: Yes      Need for Family Participation in Patient Care: Yes (Comment) Care giver support system in place?: No (comment) Current home services: DME Criminal Activity/Legal Involvement Pertinent to Current Situation/Hospitalization: No - Comment as needed  Activities of Daily Living Home Assistive Devices/Equipment: Environmental consultant (specify type), Cane (specify quad or straight) ADL Screening (condition at time of admission) Patient's cognitive ability adequate to safely complete daily activities?: Yes Is the  patient deaf or have difficulty hearing?: Yes Does the patient have difficulty seeing, even when wearing glasses/contacts?: No (hx cataract surgery with lens implants in both eyes) Does the patient have difficulty concentrating, remembering, or making decisions?: Yes Patient able to express need for assistance with ADLs?: Yes Does the patient have difficulty dressing or bathing?: No Independently performs ADLs?: No Communication: Independent Dressing (OT): Independent Grooming: Independent Feeding: Independent Bathing: Independent Toileting: Needs assistance Is this a change from baseline?: Pre-admission baseline In/Out Bed: Needs assistance Is this a change from baseline?: Pre-admission baseline Walks in Home: Needs assistance (walks with walker or cane) Is this a change from baseline?: Pre-admission baseline Does the patient have difficulty walking or climbing stairs?: Yes Weakness of Legs: Both Weakness of Arms/Hands: Both  Permission Sought/Granted Permission sought to share information with : Family Supports, Case Manager Permission granted to share information with : Yes, Verbal Permission Granted  Share Information with NAME: Retail buyer     Permission granted to share info w Relationship: Grandson  Permission granted to share info w Contact Information: [phone]  Emotional Assessment Appearance:: Appears stated age Attitude/Demeanor/Rapport: Unable to Assess Affect (typically observed): Unable to Assess Orientation: : Oriented to Self, Oriented to Place, Oriented to  Time, Oriented to Situation Alcohol / Substance Use: Not Applicable Psych Involvement: No (comment)  Admission diagnosis:  Acute appendicitis [K35.80] Acute appendicitis, unspecified acute appendicitis type [K35.80] Sepsis without acute organ dysfunction, due to unspecified organism [facility]) [A41.9] Patient Active Problem List   Diagnosis Date Noted   Non-English  speaking patient 07/21/2022   History of  subdural hematoma 07/21/2022   History of fall 07/21/2022   Acute appendicitis 07/21/2022   Acute gangrenous appendicitis s/p lap appendectomy 07/20/2022 07/20/2022   Acute encephalopathy 07/20/2022   Severe sepsis ([place]) 07/20/2022   Acute respiratory failure with hypoxia ([place]) 07/20/2022   Acute pulmonary edema ([place]) 07/20/2022   Hypothyroidism 02/23/2015   Ataxia 06/02/2013   Falling 06/02/2013   Hyperlipidemia 06/02/2013   Hypertension 02/28/2012   PCP:  Donald Prose, MD Pharmacy:   [facility] [place], [address] [place] [address] Phone: [phone] Fax: [phone]     Social Determinants of Health (SDOH) Interventions    Readmission Risk Interventions    07/25/2022   11:43 AM  Readmission Risk Prevention Plan  Post Dischage Appt Complete  Medication Screening Complete  Transportation Screening Complete

## 2022-07-25 NOTE — Progress Notes (Signed)
5 Days Post-Op  Subjective: CC: Back on o2 this morning. Patient's son at bedside.  Tolerating liquids, did not try solids yesterday. No n/v. BM yesterday. Voiding. Oob with PT yesterday 60ft using RW on RA w/ Spo2 92%.   Objective: Vital signs in last 24 hours: Temp:  [97.7 F (36.5 C)-99.7 F (37.6 C)] 99 F (37.2 C) (09/13 0626) Pulse Rate:  [61-88] 88 (09/13 0626) Resp:  [18] 18 (09/13 0626) BP: (130-142)/(90-99) 130/94 (09/13 0626) SpO2:  [93 %-98 %] 98 % (09/13 0626) Last BM Date : 07/23/22  Intake/Output from previous day: 09/12 0701 - 09/13 0700 In: 938.1 [P.O.:840; IV Piggyback:98.1] Out: 2725 [Urine:2725] Intake/Output this shift: No intake/output data recorded.  PE: Gen:  Alert, NAD Abd: Soft, no significant distension, +BS, ttp around his incisions that appears appropriate. Otherwise NT. No rigidity or guarding. Incisions with glue intact appears well and are without drainage, bleeding, or signs of infection  Lab Results:  Recent Labs    07/24/22 0433  WBC 7.8  HGB 14.0  HCT 42.3  PLT 208   BMET Recent Labs    07/23/22 0424 07/24/22 0433  NA 139 140  K 4.2 4.5  CL 103 101  CO2 29 32  GLUCOSE 138* 150*  BUN 21 16  CREATININE 0.86 0.74  CALCIUM 8.6* 8.8*   PT/INR No results for input(s): "LABPROT", "INR" in the last 72 hours. CMP     Component Value Date/Time   NA 140 07/24/2022 0433   K 4.5 07/24/2022 0433   CL 101 07/24/2022 0433   CO2 32 07/24/2022 0433   GLUCOSE 150 (H) 07/24/2022 0433   BUN 16 07/24/2022 0433   CREATININE 0.74 07/24/2022 0433   CALCIUM 8.8 (L) 07/24/2022 0433   PROT 7.6 07/21/2022 1041   ALBUMIN 3.8 07/21/2022 1041   AST 44 (H) 07/21/2022 1041   ALT 22 07/21/2022 1041   ALKPHOS 28 (L) 07/21/2022 1041   BILITOT 1.5 (H) 07/21/2022 1041   GFRNONAA >60 07/24/2022 0433   GFRAA >60 04/28/2020 1546   Lipase     Component Value Date/Time   LIPASE 32 04/28/2020 1546    Studies/Results: DG CHEST PORT 1  VIEW  Result Date: 07/24/2022 CLINICAL DATA:  Follow-up exam. EXAM: PORTABLE CHEST 1 VIEW COMPARISON:  07/21/2022 FINDINGS: Heart size and mediastinal contours are stable. Unchanged elevation of the right hemidiaphragm. Lung volumes are improved from the previous exam. Mild pulmonary vascular congestion. No airspace opacities. IMPRESSION: 1. Improved aeration to the lungs compared with previous exam. 2. Mild pulmonary vascular congestion. Electronically Signed   By: Signa Kell M.D.   On: 07/24/2022 10:56   MR BRAIN WO CONTRAST  Result Date: 07/23/2022 CLINICAL DATA:  Altered mental status EXAM: MRI HEAD WITHOUT CONTRAST TECHNIQUE: Multiplanar, multiecho pulse sequences of the brain and surrounding structures were obtained without intravenous contrast. COMPARISON:  02/12/2015 FINDINGS: Brain: No acute infarct, mass effect or extra-axial collection. Multifocal central predominant chronic microhemorrhage. There is confluent hyperintense T2-weighted signal within the white matter. There is advanced atrophy. The midline structures are normal. Vascular: Major flow voids are preserved. Skull and upper cervical spine: Normal calvarium and skull base. Visualized upper cervical spine and soft tissues are normal. Sinuses/Orbits:No paranasal sinus fluid levels or advanced mucosal thickening. No mastoid or middle ear effusion. Normal orbits. IMPRESSION: 1. No acute intracranial abnormality. 2. Advanced atrophy and chronic microvascular ischemic disease. Electronically Signed   By: Deatra Robinson M.D.   On: 07/23/2022 19:41  Anti-infectives: Anti-infectives (From admission, onward)    Start     Dose/Rate Route Frequency Ordered Stop   07/21/22 0930  piperacillin-tazobactam (ZOSYN) IVPB 3.375 g        3.375 g 12.5 mL/hr over 240 Minutes Intravenous Every 8 hours 07/21/22 0839 07/26/22 0559   07/20/22 2030  ceFEPIme (MAXIPIME) 2 g in sodium chloride 0.9 % 100 mL IVPB  Status:  Discontinued       See Hyperspace  for full Linked Orders Report.   2 g 200 mL/hr over 30 Minutes Intravenous Every 8 hours 07/20/22 1938 07/21/22 0839   07/20/22 2000  metroNIDAZOLE (FLAGYL) IVPB 500 mg  Status:  Discontinued       See Hyperspace for full Linked Orders Report.   500 mg 100 mL/hr over 60 Minutes Intravenous Every 12 hours 07/20/22 1938 07/21/22 0839   07/20/22 1145  metroNIDAZOLE (FLAGYL) IVPB 500 mg        500 mg 100 mL/hr over 60 Minutes Intravenous  Once 07/20/22 1136 07/20/22 1243   07/20/22 1015  cefTRIAXone (ROCEPHIN) 2 g in sodium chloride 0.9 % 100 mL IVPB  Status:  Discontinued        2 g 200 mL/hr over 30 Minutes Intravenous  Once 07/20/22 1000 07/20/22 1007   07/20/22 1015  cefTRIAXone (ROCEPHIN) 1 g in sodium chloride 0.9 % 100 mL IVPB  Status:  Discontinued        1 g 200 mL/hr over 30 Minutes Intravenous Every hour 07/20/22 1008 07/20/22 1214   07/20/22 1000  cefTRIAXone (ROCEPHIN) 1 g in sodium chloride 0.9 % 100 mL IVPB  Status:  Discontinued        1 g 200 mL/hr over 30 Minutes Intravenous  Once 07/20/22 0950 07/20/22 1037   07/20/22 0945  ceFEPIme (MAXIPIME) 2 g in sodium chloride 0.9 % 100 mL IVPB  Status:  Discontinued        2 g 200 mL/hr over 30 Minutes Intravenous  Once 07/20/22 0943 07/20/22 0950   07/20/22 0945  vancomycin (VANCOCIN) IVPB 1000 mg/200 mL premix  Status:  Discontinued        1,000 mg 200 mL/hr over 60 Minutes Intravenous  Once 07/20/22 3825 07/20/22 0950        Assessment/Plan POD 5 s/p Laparoscopic Appendectomy for Acute gangrenous perforated appendicitis - Completed 5d post op abx today.  - Adv diet - Mobilize, therapies following and recommending HH - Pulm toilet - When tolerating diet can start working towards discharge from our standpoint. Will arrange follow up.    FEN - Soft diet, IVF per [facility] VTE - SCDs, Lovenox  ID - Zosyn 9/9 - 9/14. WBC normalized. Lactic cleared. Afebrile. No tachycardia or systolic hypotension.  Foley - None, Voiding   -  Per [facility] - Acute Respiratory Failure - s/p IV lasix yesterday 1/4 BC with Staph - already on abx, ? Contaminant  Hypothyroidism - home Levothyroxine  Hypertension   LOS: 4 days    Jacinto Halim , [facility] 07/25/2022, 9:29 AM Please see Amion for pager number during day hours 7:00am-4:30pm

## 2022-07-26 ENCOUNTER — Inpatient Hospital Stay (HOSPITAL_COMMUNITY): Payer: Medicare Other

## 2022-07-26 DIAGNOSIS — G9340 Encephalopathy, unspecified: Secondary | ICD-10-CM | POA: Diagnosis not present

## 2022-07-26 DIAGNOSIS — K35891 Other acute appendicitis without perforation, with gangrene: Secondary | ICD-10-CM | POA: Diagnosis not present

## 2022-07-26 DIAGNOSIS — J9601 Acute respiratory failure with hypoxia: Secondary | ICD-10-CM | POA: Diagnosis not present

## 2022-07-26 MED ORDER — IOHEXOL 350 MG/ML SOLN
75.0000 mL | Freq: Once | INTRAVENOUS | Status: AC | PRN
  Administered 2022-07-26: 75 mL via INTRAVENOUS

## 2022-07-26 NOTE — Progress Notes (Addendum)
PROGRESS NOTE    Elson AreasBansidhar Arrighi  UEA:[id number] DOB: 07/03/1935 DOA: 07/20/2022 PCP: Deatra JamesSun, Vyvyan, MD    Chief Complaint  Patient presents with   Altered Mental Status   Urinary Frequency    Brief Narrative:    Elson AreasBansidhar Wessner is a pleasant 87 y.o. male with past medical history of hypertension, hypothyroidism, hyperlipidemia, nephrolithiasis, and SDH s/p evacuation in 2016, now presenting to the emergency department with lethargy, confusion, and fall. He had a fall overnight while trying to use the bathroom, was confused and unable to get up on his own. His wife was unable to help him up from the floor and called EMS.  He was found to be saturating in the 80s on room air, was placed on nonrebreather, and brought into the ED. Patient was admitted by surgery and taken to the OR  on 9/8 for laparoscopic appendectomy where he was found to have acute gangrenous perforated appendicitis.  He emerged from anesthesia hypertensive, tachycardic, wheezing, and hypoxic.  He had a chest x-ray concerning for developing pulmonary edema, was given 40 mg IV Lasix, Xopenex, and transferred to the ICU. Patient continued to make progress and he [facility] the [facility]  Patient continued to be weak and deconditioned, MRI of the brain was done on 07/23/22 to evaluate for stroke, which was negative for acute intra cranial abnormalities.  Pt seen and examined at bedside. Sister  reports since yesterday evening, pt had trouble using the right upper extremity while the son at bedside, reports he had weakness since one week.   Assessment & Plan:   Principal Problem:   Acute gangrenous appendicitis s/p lap appendectomy 07/20/2022 Active Problems:   Ataxia   Hypertension   Hyperlipidemia   Hypothyroidism   Acute encephalopathy   Severe sepsis (HCC)   Acute respiratory failure with hypoxia (HCC)   Acute pulmonary edema (HCC)   Non-English speaking patient   History of subdural hematoma    History of fall   Acute appendicitis  Acute respiratory failure with hypoxia initially requiring 10 lit of [place] oxygen Appears to have much improved.  Repeated CXR shows persistent vascular congestion, was given a dose of IV lasix. Wheezing has improved and weaned off oxygen.  Echocardiogram showed preserved LVEF.  Family denies any smoking history.  No history of asthma or restricted air way disease.     Acute metabolic encephalopathy:  Patient had increased fatigue and was less verbal for ~1 wk before become delirious and lethargy.  TSH wnl, b12 levels low normal .  UA Is negative. RPR is Negative. Initial CT head does not show any acute intracranial abnormality.  Except  for the language barrier, he has answered simple questions. But he continues to have generalized weakness and intermittent confusion. MRI brain without contrast on 9/11 ruled out acute stroke.  Therapy eval recommending SNF. Toc aware.   Right upper extremity weakness:   Unclear regarding the time of onset of weakness,. Last MRi on 07/23/22 is negative for acute stroke.  On exam pt does not have facial droop, right upper extremity strength about 4/5 , LUE strength 5/5, no sensory deficits. No focal deficits in the lower extremities. Patient alert and oriented to person only.  Speech is garbled.  Stat CT head without contrast is negative for acute stroke.  Repeat MRI of the brain without contrast ordered stat.  Neurology consulted.  Pt already on aspirin 81 mg daily.   Hypertension:  Bp parameters are optimal.  Hypothyroidism;  Resume synthroid.    Sepsis secondary to perforated gangrenous appendicitis S/p cholecystectomy. Sepsis physiology has resolved. BP parameters have normalized.  Completed 7 days of IV antibiotics.  Leukocytosis resolved.  Advance diet as tolerated.  Lactic acid resolved.    Bilateral foot pain: going on for many years.  Suspect neuropathy.  Would wait until he is back to  baseline before starting him on Neurontin.     Staphylococcus species in 1 out of 4 blood culture bottles Likely a contaminant.  Patient already on zosyn.  Repeat blood cultures are negative.  Patient completed 7 days of antibiotics.     Antimicrobials:  Antibiotics Given (last 72 hours)     Date/Time Action Medication Dose Rate   07/23/22 2119 New Bag/Given   piperacillin-tazobactam (ZOSYN) IVPB 3.375 g 3.375 g 12.5 mL/hr   07/24/22 1002 New Bag/Given   piperacillin-tazobactam (ZOSYN) IVPB 3.375 g 3.375 g 12.5 mL/hr   07/24/22 1803 New Bag/Given  [time changed, 0600 dose given at 1000 due to loss of IV access]   piperacillin-tazobactam (ZOSYN) IVPB 3.375 g 3.375 g 12.5 mL/hr   07/25/22 0207 New Bag/Given   piperacillin-tazobactam (ZOSYN) IVPB 3.375 g 3.375 g 12.5 mL/hr   07/25/22 0954 New Bag/Given   piperacillin-tazobactam (ZOSYN) IVPB 3.375 g 3.375 g 12.5 mL/hr   07/25/22 1803 New Bag/Given   piperacillin-tazobactam (ZOSYN) IVPB 3.375 g 3.375 g 12.5 mL/hr   07/26/22 0129 New Bag/Given   piperacillin-tazobactam (ZOSYN) IVPB 3.375 g 3.375 g 12.5 mL/hr         Subjective: Poor oral intake, no chest pain or sob. Speech is garbled. In comprehensible.   Objective: Vitals:   07/25/22 1341 07/25/22 2130 07/26/22 0600 07/26/22 1351  BP: 118/72 (!) 142/93 119/78 (!) 129/90  Pulse: 81 95 81 86  Resp: 18 18 18 18   Temp: 98.1 F (36.7 C) 98.9 F (37.2 C) 98.9 F (37.2 C) 98 F (36.7 C)  TempSrc: Oral Oral Oral Oral  SpO2: 96% 91% 96% 94%  Weight:      Height:        Intake/Output Summary (Last 24 hours) at 07/26/2022 1504 Last data filed at 07/26/2022 1000 Gross per 24 hour  Intake 879.99 ml  Output 1075 ml  Net -195.01 ml    Filed Weights   07/20/22 0847 07/20/22 2015  Weight: 77.1 kg 73.3 kg    Examination:  General exam:  Respiratory system: Clear to auscultation. Respiratory effort normal. Cardiovascular system: S1 & S2 heard, RRR. No JVD,  No pedal  edema. Gastrointestinal system: Abdomen is nondistended, soft and nontender.  Normal bowel sounds heard. Central nervous system: Alert and oriented to person only. RUE weakness, speech is incomprehensible, no sensory deficits. No facial droop.  Extremities: Symmetric 5 x 5 power. Skin: No rashes seen.  Psychiatry: mood is appropriate.         Data Reviewed: I have personally reviewed following labs and imaging studies  CBC: Recent Labs  Lab 07/20/22 0900 07/21/22 1041 07/22/22 0248 07/24/22 0433  WBC 12.8* 15.0* 14.2* 7.8  NEUTROABS 10.6* 12.3* 10.8* 4.2  HGB 15.6 14.8 13.5 14.0  HCT 45.6 43.7 40.9 42.3  MCV 95.0 97.3 98.3 97.0  PLT 172 160 176 208     Basic Metabolic Panel: Recent Labs  Lab 07/20/22 0900 07/21/22 1041 07/22/22 0248 07/23/22 0424 07/24/22 0433  NA 136 139 139 139 140  K 4.4 4.3 3.6 4.2 4.5  CL 101 102 102 103 101  CO2 25  32  GLUCOSE 186* 170* 145* 138* 150*  BUN CREATININE 0.79 0.88 0.91 0.86 0.74  CALCIUM 9.3 8.8* 8.4* 8.6* 8.8*     GFR: Estimated Creatinine Clearance: 60.6 mL/min (by C-G formula based on SCr of 0.74 mg/dL).  Liver Function Tests: Recent Labs  Lab 07/20/22 0900 07/21/22 1041  AST 26 44*  ALT 18 22  ALKPHOS 32* 28*  BILITOT 1.5* 1.5*  PROT 7.6 7.6  ALBUMIN 4.1 3.8     CBG: Recent Labs  Lab 07/20/22 0924  GLUCAP 174*      Recent Results (from the past 240 hour(s))  Culture, blood (Routine x 2)     Status: None   Collection Time: 07/20/22  9:00 AM   Specimen: BLOOD  Result Value Ref Range Status   Specimen Description   Final    BLOOD RIGHT ANTECUBITAL Performed at [facility], [address] [place], [place] [address]    Special Requests   Final    BOTTLES DRAWN AEROBIC AND ANAEROBIC Blood Culture results may not be optimal due to an excessive volume of blood received in culture bottles Performed at [facility], [address]  [place], [place] [address]    Culture   Final    NO GROWTH 5 DAYS Performed at [facility], [address] [place], [place] [address]    Report Status 07/25/2022 FINAL  Final  Urine Culture     Status: None   Collection Time: 07/20/22  9:00 AM   Specimen: In/Out Cath Urine  Result Value Ref Range Status   Specimen Description   Final    IN/OUT CATH URINE Performed at [facility], [address], [place], [place] [address]    Special Requests   Final    NONE Performed at [facility], [address], [place], [place] [address]    Culture   Final    NO GROWTH Performed at [facility], [address], [place], [place] [address]    Report Status 07/21/2022 FINAL  Final  Culture, blood (Routine x 2)     Status: Abnormal   Collection Time: 07/20/22  9:12 AM   Specimen: BLOOD  Result Value Ref Range Status   Specimen Description BLOOD LEFT ANTECUBITAL  Final   Special Requests   Final    BOTTLES DRAWN AEROBIC AND ANAEROBIC Blood Culture results may not be optimal due to an excessive volume of blood received in culture bottles   Culture  Setup Time   Final    GRAM POSITIVE COCCI IN CLUSTERS ANAEROBIC BOTTLE ONLY CRITICAL RESULT CALLED TO, READ BACK BY AND VERIFIED WITH: PHARMD E WILLIAMSON [id number] AT 1752 BY CM    Culture (A)  Final    STAPHYLOCOCCUS AURICULARIS THE SIGNIFICANCE OF ISOLATING THIS ORGANISM FROM A SINGLE SET OF BLOOD CULTURES WHEN MULTIPLE SETS ARE DRAWN IS UNCERTAIN. PLEASE NOTIFY THE MICROBIOLOGY DEPARTMENT WITHIN ONE WEEK IF SPECIATION AND SENSITIVITIES ARE REQUIRED.    Report Status 07/23/2022 FINAL  Final  Blood Culture ID Panel (Reflexed)     Status: Abnormal   Collection Time: 07/20/22  9:12 AM  Result Value Ref Range Status   Enterococcus faecalis NOT DETECTED NOT DETECTED Final   Enterococcus Faecium NOT DETECTED NOT DETECTED Final   Listeria monocytogenes NOT DETECTED NOT DETECTED Final   Staphylococcus  species DETECTED (A) NOT DETECTED Final    Comment: CRITICAL  RESULT CALLED TO, READ BACK BY AND VERIFIED WITH: PHARMD E WILLIAMSON [id number] AT 1752 BY CM    Staphylococcus aureus (BCID) NOT DETECTED NOT DETECTED Final   Staphylococcus epidermidis NOT DETECTED NOT DETECTED Final   Staphylococcus lugdunensis NOT DETECTED NOT DETECTED Final   Streptococcus species NOT DETECTED NOT DETECTED Final   Streptococcus agalactiae NOT DETECTED NOT DETECTED Final   Streptococcus pneumoniae NOT DETECTED NOT DETECTED Final   Streptococcus pyogenes NOT DETECTED NOT DETECTED Final   A.calcoaceticus-baumannii NOT DETECTED NOT DETECTED Final   Bacteroides fragilis NOT DETECTED NOT DETECTED Final   Enterobacterales NOT DETECTED NOT DETECTED Final   Enterobacter cloacae complex NOT DETECTED NOT DETECTED Final   Escherichia coli NOT DETECTED NOT DETECTED Final   Klebsiella aerogenes NOT DETECTED NOT DETECTED Final   Klebsiella oxytoca NOT DETECTED NOT DETECTED Final   Klebsiella pneumoniae NOT DETECTED NOT DETECTED Final   Proteus species NOT DETECTED NOT DETECTED Final   Salmonella species NOT DETECTED NOT DETECTED Final   Serratia marcescens NOT DETECTED NOT DETECTED Final   Haemophilus influenzae NOT DETECTED NOT DETECTED Final   Neisseria meningitidis NOT DETECTED NOT DETECTED Final   Pseudomonas aeruginosa NOT DETECTED NOT DETECTED Final   Stenotrophomonas maltophilia NOT DETECTED NOT DETECTED Final   Candida albicans NOT DETECTED NOT DETECTED Final   Candida auris NOT DETECTED NOT DETECTED Final   Candida glabrata NOT DETECTED NOT DETECTED Final   Candida krusei NOT DETECTED NOT DETECTED Final   Candida parapsilosis NOT DETECTED NOT DETECTED Final   Candida tropicalis NOT DETECTED NOT DETECTED Final   Cryptococcus neoformans/gattii NOT DETECTED NOT DETECTED Final    Comment: Performed at [facility], [address] [place], [place] [address]  Resp Panel by RT-PCR (Flu A&B, Covid) Anterior  Nasal Swab     Status: None   Collection Time: 07/20/22  9:21 AM   Specimen: Anterior Nasal Swab  Result Value Ref Range Status   SARS Coronavirus 2 by RT PCR NEGATIVE NEGATIVE Final    Comment: (NOTE) SARS-CoV-2 target nucleic acids are NOT DETECTED.  The SARS-CoV-2 RNA is generally detectable in upper respiratory specimens during the acute phase of infection. The lowest concentration of SARS-CoV-2 viral copies this assay can detect is 138 copies/mL. A negative result does not preclude SARS-Cov-2 infection and should not be used as the sole basis for treatment or other patient management decisions. A negative result may occur with  improper specimen collection/handling, submission of specimen other than nasopharyngeal swab, presence of viral mutation(s) within the areas targeted by this assay, and inadequate number of viral copies(<138 copies/mL). A negative result must be combined with clinical observations, patient history, and epidemiological information. The expected result is Negative.  Fact Sheet for Patients:  BloggerCourse.com  Fact Sheet for Healthcare Providers:  SeriousBroker.it  This test is no t yet approved or cleared by the [place] FDA and  has been authorized for detection and/or diagnosis of SARS-CoV-2 by FDA under an Emergency Use Authorization (EUA). This EUA will remain  in effect (meaning this test can be used) for the duration of the COVID-19 declaration under Section 564(b)(1) of the Act, 21 U.S.C.section 360bbb-3(b)(1), unless the authorization is terminated  or revoked sooner.       Influenza A by PCR NEGATIVE NEGATIVE Final   Influenza B by PCR NEGATIVE NEGATIVE Final    Comment: (NOTE) The Xpert Xpress SARS-CoV-2/FLU/RSV plus assay is intended as an aid in the diagnosis of influenza from Nasopharyngeal swab specimens  and should not be used as a sole basis for treatment. Nasal washings  and aspirates are unacceptable for Xpert Xpress SARS-CoV-2/FLU/RSV testing.  Fact Sheet for Patients: BloggerCourse.com  Fact Sheet for Healthcare Providers: SeriousBroker.it  This test is not yet approved or cleared by the [place] FDA and has been authorized for detection and/or diagnosis of SARS-CoV-2 by FDA under an Emergency Use Authorization (EUA). This EUA will remain in effect (meaning this test can be used) for the duration of the COVID-19 declaration under Section 564(b)(1) of the Act, 21 U.S.C. section 360bbb-3(b)(1), unless the authorization is terminated or revoked.  Performed at [facility], [address] [place], [place] [address]   MRSA Next Gen by PCR, Nasal     Status: None   Collection Time: 07/20/22  7:40 PM   Specimen: Nasal Mucosa; Nasal Swab  Result Value Ref Range Status   MRSA by PCR Next Gen NOT DETECTED NOT DETECTED Final    Comment: (NOTE) The GeneXpert MRSA Assay (FDA approved for NASAL specimens only), is one component of a comprehensive MRSA colonization surveillance program. It is not intended to diagnose MRSA infection nor to guide or monitor treatment for MRSA infections. Test performance is not FDA approved in patients less than 18 years old. Performed at [facility], [address] [place], [place] [address]   Culture, blood (Routine X 2) w Reflex to ID Panel     Status: None (Preliminary result)   Collection Time: 07/25/22 12:36 PM   Specimen: BLOOD RIGHT ARM  Result Value Ref Range Status   Specimen Description   Final    BLOOD RIGHT ARM Performed at [facility], [address] [place], [place] [address]    Special Requests   Final    BOTTLES DRAWN AEROBIC ONLY Blood Culture results may not be optimal due to an inadequate volume of blood received in culture bottles Performed at [facility], [address], [place], [place] [address]    Culture   Final    NO GROWTH < 24 HOURS Performed at [facility], [address] [place], [place] [address]    Report Status PENDING  Incomplete  Culture, blood (Routine X 2) w Reflex to ID Panel     Status: None (Preliminary result)   Collection Time: 07/25/22 12:36 PM   Specimen: BLOOD RIGHT HAND  Result Value Ref Range Status   Specimen Description   Final    BLOOD RIGHT HAND Performed at [facility], [address], [place], [place] [address]    Special Requests   Final    BOTTLES DRAWN AEROBIC ONLY Blood Culture results may not be optimal due to an inadequate volume of blood received in culture bottles Performed at [facility], [address] [place], [place] [address]    Culture   Final    NO GROWTH < 24 HOURS Performed at [facility], [address], [place], [place] [address]    Report Status PENDING  Incomplete         Radiology Studies: CT HEAD WO CONTRAST ( )  Result Date: 07/26/2022 CLINICAL DATA:  Mental status change, unknown cause. EXAM: CT HEAD WITHOUT CONTRAST TECHNIQUE: Contiguous axial images were obtained from the base of the skull through the vertex without intravenous contrast. RADIATION DOSE REDUCTION: This exam was performed according to the departmental dose-optimization program which includes automated exposure control, adjustment of the mA and/or  kV according to patient size and/or use of iterative reconstruction technique. COMPARISON:  CT head 07/20/2022.  MRI head 07/23/2022. FINDINGS: Brain: There is no evidence of acute intracranial hemorrhage, mass lesion, brain edema or extra-axial fluid collection. Diffuse atrophy with prominence of the ventricles and subarachnoid spaces. There are chronic small vessel ischemic changes in the periventricular white matter which are unchanged from the recent prior studies. Dense basal ganglia calcifications are present  bilaterally. There is no CT evidence of acute cortical infarction. Vascular: Prominent intracranial vascular calcifications. No hyperdense vessel identified. Skull: Right frontal parietal burr holes are again noted. No acute calvarial findings. Sinuses/Orbits: The visualized paranasal sinuses and mastoid air cells are clear. No orbital abnormalities are seen. Other: None. IMPRESSION: Stable examination without evidence of acute intracranial findings. Stable atrophy and chronic small vessel ischemic changes. Electronically Signed   By: Carey Bullocks M.D.   On: 07/26/2022 11:24        Scheduled Meds:  acetaminophen  1,000 mg Oral TID   aspirin EC  81 mg Oral q AM   Chlorhexidine Gluconate Cloth  6 each Topical Q0600   enoxaparin (LOVENOX) injection  40 mg Subcutaneous Q24H   lactose free nutrition  237 mL Oral TID WC   levothyroxine  50 mcg Oral QAC breakfast   lip balm   Topical BID   losartan  25 mg Oral Daily   multivitamin with minerals  1 tablet Oral Daily   polycarbophil  625 mg Oral BID   Continuous Infusions:  sodium chloride Stopped (07/21/22 1432)   methocarbamol (ROBAXIN) IV     ondansetron (ZOFRAN) IV       LOS: 5 days        Kathlen Mody, MD [facility]   To contact the attending provider between 7A-7P or the covering provider during after hours 7P-7A, please log into the web site [email or web address] and access using universal [place] password for that web site. If you do not have the password, please call the hospital operator.  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 Discussed with Dr Selina Cooley, transfer the patient to [facility] for further evaluation.  Discussed with family and are agreeable. Patient would be beneficial with dual anti platelet agents. Please reach out to surgery , how soon we can start plavix post procedure.    Kathlen Mody, MD

## 2022-07-26 NOTE — TOC Progression Note (Signed)
Transition of Care [facility]) - Progression Note    Patient Details  Name: [name] MRN: [id number] Date of Birth: 12/05/1934  Transition of Care [facility]) CM/SW Contact  Coralyn Helling, [place] Phone Number: 07/26/2022, 11:11 AM  Clinical Narrative:   Currently HH is arranged for patient. Per attending family considering SNF. OT recommend SNF at the time. PT coming to reeval. TOC to follow for DC needs.     Expected Discharge Plan: Home w Home Health Services Barriers to Discharge: No Barriers Identified  Expected Discharge Plan and Services Expected Discharge Plan: Home w Home Health Services In-house Referral: NA Discharge Planning Services: CM Consult Post Acute Care Choice: Home Health Living arrangements for the past 2 months: Apartment                 DME Arranged: N/A DME Agency: NA       HH Arranged: PT, OT HH Agency: [facility] Date HH Agency Contacted: 07/25/22 Time HH Agency Contacted: 1145 Representative spoke with at [facility] Agency: Amy   Social Determinants of Health (SDOH) Interventions    Readmission Risk Interventions    07/25/2022   11:43 AM  Readmission Risk Prevention Plan  Post Dischage Appt Complete  Medication Screening Complete  Transportation Screening Complete

## 2022-07-26 NOTE — Progress Notes (Signed)
PT Cancellation Note  Patient Details Name: Rhyatt Muska MRN: [id number] DOB: [birth year]   Cancelled Treatment:    Reason Eval/Treat Not Completed: Patient at procedure or test/unavailable (Pt leaving room shortly for stat MRI. Will follow.)  Tamala Ser PT 07/26/2022  Acute Rehabilitation Services  Office [phone]

## 2022-07-26 NOTE — Progress Notes (Signed)
6 Days Post-Op  Subjective: CC: Son at bedside.  Patient much less verbal for me today. Does not talk in sentences but will shake head yes and no. Tolerating diet (son reports food from hoome) without n/v. Abdominal pain improved and pain well controlled. BM today.  Son reports he noticed R sided weakness. Unclear when last normal. Appears he did not do as well with OT yesterday and now recommended for SNF.   Objective: Vital signs in last 24 hours: Temp:  [98.1 F (36.7 C)-98.9 F (37.2 C)] 98.9 F (37.2 C) (09/14 0600) Pulse Rate:  [81-95] 81 (09/14 0600) Resp:  [18] 18 (09/14 0600) BP: (118-142)/(72-93) 119/78 (09/14 0600) SpO2:  [91 %-96 %] 96 % (09/14 0600) Last BM Date : 07/23/22  Intake/Output from previous day: 09/13 0701 - 09/14 0700 In: 1360 [P.O.:1160; IV Piggyback:200] Out: 1125 [Urine:1125] Intake/Output this shift: No intake/output data recorded.  PE: Gen:  Alert, NAD Lungs: Normal rate and effort  Abd: Soft, no significant distension, +BS, ttp around his incisions that appears appropriate. Otherwise NT. No rigidity or guarding. Incisions with glue intact appears well and are without drainage, bleeding, or signs of infection Msk: No LE edema Neuro: Does not converse in sentences for me, will only nod head yes and no. No obvious facial droop. EOMI. Facial light touch sensation equal. Midline tongue extension. Reports SILT and equal to BUE and BLE's. MAE's. He appears to have good biceps strength b/l but does not squeeze as hard on the R compared to the left for grip strength. Able to raise both legs individually against gravity. Unable to hold R leg in air against minimal resistance but able to resist on L.   Lab Results:  Recent Labs    07/24/22 0433  WBC 7.8  HGB 14.0  HCT 42.3  PLT 208   BMET Recent Labs    07/24/22 0433  NA 140  K 4.5  CL 101  CO2 32  GLUCOSE 150*  BUN 16  CREATININE 0.74  CALCIUM 8.8*   PT/INR No results for input(s):  "LABPROT", "INR" in the last 72 hours. CMP     Component Value Date/Time   NA 140 07/24/2022 0433   K 4.5 07/24/2022 0433   CL 101 07/24/2022 0433   CO2 32 07/24/2022 0433   GLUCOSE 150 (H) 07/24/2022 0433   BUN 16 07/24/2022 0433   CREATININE 0.74 07/24/2022 0433   CALCIUM 8.8 (L) 07/24/2022 0433   PROT 7.6 07/21/2022 1041   ALBUMIN 3.8 07/21/2022 1041   AST 44 (H) 07/21/2022 1041   ALT 22 07/21/2022 1041   ALKPHOS 28 (L) 07/21/2022 1041   BILITOT 1.5 (H) 07/21/2022 1041   GFRNONAA >60 07/24/2022 0433   GFRAA >60 04/28/2020 1546   Lipase     Component Value Date/Time   LIPASE 32 04/28/2020 1546    Studies/Results: DG CHEST PORT 1 VIEW  Result Date: 07/24/2022 CLINICAL DATA:  Follow-up exam. EXAM: PORTABLE CHEST 1 VIEW COMPARISON:  07/21/2022 FINDINGS: Heart size and mediastinal contours are stable. Unchanged elevation of the right hemidiaphragm. Lung volumes are improved from the previous exam. Mild pulmonary vascular congestion. No airspace opacities. IMPRESSION: 1. Improved aeration to the lungs compared with previous exam. 2. Mild pulmonary vascular congestion. Electronically Signed   By: Signa Kell M.D.   On: 07/24/2022 10:56    Anti-infectives: Anti-infectives (From admission, onward)    Start     Dose/Rate Route Frequency Ordered Stop   07/21/22 0930  piperacillin-tazobactam (ZOSYN) IVPB 3.375 g        3.375 g 12.5 mL/hr over 240 Minutes Intravenous Every 8 hours 07/21/22 0839 07/26/22 0529   07/20/22 2030  ceFEPIme (MAXIPIME) 2 g in sodium chloride 0.9 % 100 mL IVPB  Status:  Discontinued       See Hyperspace for full Linked Orders Report.   2 g 200 mL/hr over 30 Minutes Intravenous Every 8 hours 07/20/22 1938 07/21/22 0839   07/20/22 2000  metroNIDAZOLE (FLAGYL) IVPB 500 mg  Status:  Discontinued       See Hyperspace for full Linked Orders Report.   500 mg 100 mL/hr over 60 Minutes Intravenous Every 12 hours 07/20/22 1938 07/21/22 0839   07/20/22 1145   metroNIDAZOLE (FLAGYL) IVPB 500 mg        500 mg 100 mL/hr over 60 Minutes Intravenous  Once 07/20/22 1136 07/20/22 1243   07/20/22 1015  cefTRIAXone (ROCEPHIN) 2 g in sodium chloride 0.9 % 100 mL IVPB  Status:  Discontinued        2 g 200 mL/hr over 30 Minutes Intravenous  Once 07/20/22 1000 07/20/22 1007   07/20/22 1015  cefTRIAXone (ROCEPHIN) 1 g in sodium chloride 0.9 % 100 mL IVPB  Status:  Discontinued        1 g 200 mL/hr over 30 Minutes Intravenous Every hour 07/20/22 1008 07/20/22 1214   07/20/22 1000  cefTRIAXone (ROCEPHIN) 1 g in sodium chloride 0.9 % 100 mL IVPB  Status:  Discontinued        1 g 200 mL/hr over 30 Minutes Intravenous  Once 07/20/22 0950 07/20/22 1037   07/20/22 0945  ceFEPIme (MAXIPIME) 2 g in sodium chloride 0.9 % 100 mL IVPB  Status:  Discontinued        2 g 200 mL/hr over 30 Minutes Intravenous  Once 07/20/22 0943 07/20/22 0950   07/20/22 0945  vancomycin (VANCOCIN) IVPB 1000 mg/200 mL premix  Status:  Discontinued        1,000 mg 200 mL/hr over 60 Minutes Intravenous  Once 07/20/22 8546 07/20/22 0950        Assessment/Plan POD 6 s/p Laparoscopic Appendectomy for Acute gangrenous perforated appendicitis - From an abdominal standpoint he appears stable. He completed 5d post op abx. WBC normalized. He is tolerating a diet without n/v and having bowel function. Incisions cdi. Working with therapies who are now recommending SNF vs HH w/ 24/7 supervision. I have arranged follow up in the office.    FEN - Soft diet, IVF per [facility] VTE - SCDs, Lovenox  ID - Zosyn 9/9 - 9/14.  Foley - None, Voiding   - Per [facility] - Acute Respiratory Failure - now on RA 1/4 BC with Staph - ? Contaminant  Hypothyroidism - home Levothyroxine  Hypertension Patient less verbal on exam for me this morning. Family reports he has not been using is R side as much and he reported it feels "weak". Exam as above. Unknown when last normal. Appears he did not do as well with OT yesterday  compared to their last eval. He did have a MRI brain on 9/11 that showed no acute intracranial abnormality and advanced atrophy and chronic microvascular ischemic disease.  I discussed with primary team over the phone who reports they are aware of new changes, have seen the patient, and are getting a stat [facility] + discussing with family further to try and see when last normal was. Further workup per their team.  LOS: 5 days    Jacinto Halim , [facility] 07/26/2022, 9:13 AM Please see Amion for pager number during day hours 7:00am-4:30pm

## 2022-07-26 NOTE — Care Management Important Message (Signed)
Important Message  Patient Details IM Letter placed in Patients room. Name: [name] MRN: [id number] Date of Birth: 1935/09/04   Medicare Important Message Given:  Yes     Caren Macadam 07/26/2022, 11:15 AM

## 2022-07-26 NOTE — Progress Notes (Signed)
Neurology Interprofessional Phone/EMR Consultation  Received a call from Dr. Kathlen Mody about this patient at [facility] with MRI findings concerning for acute ischemic infarct. He is 87 yo with hx HTN, hypothyroidism, HL, nephrolithiasis, remote SDH s/p evacuation in 2016 who presented to ED with lethargy, confusion, and fall. He had an acute abdomen on arrival and was admitted by surgery and taken to OR for lap appendectomy where he was found to have acute gangrenous perforated appendicitis. Post-op course was complicated by pulmonary edema which was successfully treated and he was transferred to [facility] service. He had a MRI brain on 07/23/22 which was negative for acute infarct but since then he has developed new RUE weakness. LKW yesterday therefore no indication for code stroke. I recommended repeat brain MRI which showed new small acute infarcts L lentiform nucleus (personal review), explaining his new L sided weakness.  Neurology recommendations: - Transfer to [facility] for stroke workup - Permissive HTN x48 hrs from sx onset goal BP <220/110. PRN labetalol or hydralazine if BP above these parameters. Avoid oral antihypertensives. After 48 hrs goal is normotension, strict avoidance of hypotension - CTA H&N - TTE 07/21/22 showed normal LVEF and no intracardiac clot, no need to repeat - Check A1c and LDL + add statin per guidelines - ASA 81mg  daily. Dr. will check with surgery to see if they are ok with starting plavix, will hold off until they OK it - q4 hr neuro checks - STAT head CT for any change in neuro exam - Tele - PT/OT/SLP - NPO until RN dysphagia screen passed and documented - Stroke education - Amb referral to neurology upon discharge   Please page neurohospitalist upon patient's arrival to [facility]  Discussed with Dr. [facility] by phone  Blake Divine, MD [facility] [phone]  If 7pm- 7am, please page neurology on call as listed in AMION.   Time spent on  Interprofessional Telephone & EMR Consult 22 minutes

## 2022-07-27 DIAGNOSIS — K35891 Other acute appendicitis without perforation, with gangrene: Secondary | ICD-10-CM | POA: Diagnosis not present

## 2022-07-27 LAB — CBC
HCT: 44.7 % (ref 39.0–52.0)
Hemoglobin: 15.2 g/dL (ref 13.0–17.0)
MCH: 32.8 pg (ref 26.0–34.0)
MCHC: 34 g/dL (ref 30.0–36.0)
MCV: 96.3 fL (ref 80.0–100.0)
Platelets: 289 10*3/uL (ref 150–400)
RBC: 4.64 MIL/uL (ref 4.22–5.81)
RDW: 12.6 % (ref 11.5–15.5)
WBC: 10.1 10*3/uL (ref 4.0–10.5)
nRBC: 0 % (ref 0.0–0.2)

## 2022-07-27 LAB — BASIC METABOLIC PANEL
Anion gap: 9 (ref 5–15)
BUN: 21 mg/dL (ref 8–23)
CO2: 29 mmol/L (ref 22–32)
Calcium: 9.5 mg/dL (ref 8.9–10.3)
Chloride: 104 mmol/L (ref 98–111)
Creatinine, Ser: 0.85 mg/dL (ref 0.61–1.24)
GFR, Estimated: >60 mL/min (ref >60)
Glucose, Bld: 158 mg/dL — ABNORMAL HIGH (ref 70–99)
Potassium: 3.8 mmol/L (ref 3.5–5.1)
Sodium: 142 mmol/L (ref 135–145)

## 2022-07-27 LAB — LIPID PANEL
Cholesterol: 192 mg/dL (ref 0–200)
HDL: 27 mg/dL — ABNORMAL LOW (ref >40)
LDL Cholesterol: 125 mg/dL — ABNORMAL HIGH (ref 0–99)
Total CHOL/HDL Ratio: 7.1 RATIO
Triglycerides: 198 mg/dL — ABNORMAL HIGH (ref <150)
VLDL: 40 mg/dL (ref 0–40)

## 2022-07-27 LAB — HEMOGLOBIN A1C
Hgb A1c MFr Bld: 6.6 % — ABNORMAL HIGH (ref 4.8–5.6)
Mean Plasma Glucose: 142.72 mg/dL

## 2022-07-27 MED ORDER — ORAL CARE MOUTH RINSE
15.0000 mL | OROMUCOSAL | Status: DC
  Administered 2022-07-27 – 2022-07-31 (×18): 15 mL via OROMUCOSAL

## 2022-07-27 MED ORDER — LEVOTHYROXINE SODIUM 100 MCG/5ML IV SOLN: 25.0000 ug | Freq: Every day | INTRAVENOUS | Status: DC

## 2022-07-27 MED ORDER — FOOD THICKENER (SIMPLYTHICK): 1.0000 | ORAL | Status: DC | PRN

## 2022-07-27 MED ORDER — ORAL CARE MOUTH RINSE: 15.0000 mL | OROMUCOSAL | Status: DC | PRN

## 2022-07-27 MED ORDER — CLOPIDOGREL BISULFATE 75 MG PO TABS
75.0000 mg | ORAL_TABLET | Freq: Every day | ORAL | Status: DC
  Administered 2022-07-27 – 2022-07-31 (×5): 75 mg via ORAL
  Filled 2022-07-27 (×5): qty 1

## 2022-07-27 MED ORDER — ASPIRIN 300 MG RE SUPP
300.0000 mg | Freq: Every day | RECTAL | Status: DC
  Administered 2022-07-27 – 2022-07-28 (×2): 300 mg via RECTAL
  Filled 2022-07-27 (×3): qty 1

## 2022-07-27 MED ORDER — ATORVASTATIN CALCIUM 40 MG PO TABS
80.0000 mg | ORAL_TABLET | Freq: Every day | ORAL | Status: DC
  Administered 2022-07-27 – 2022-07-31 (×5): 80 mg via ORAL
  Filled 2022-07-27 (×5): qty 2

## 2022-07-27 NOTE — Progress Notes (Signed)
PT Cancellation Note  Patient Details Name: [name] MRN: [id number] DOB: [birth year]   Cancelled Treatment:     Pt awaiting transfer to [facility] for Neurology Consult for Acute CVA.   Felecia Shelling  PTA Acute  Rehabilitation Services Office M-F          [phone] Weekend pager [phone]

## 2022-07-27 NOTE — Evaluation (Signed)
Clinical/Bedside Swallow Evaluation Patient Details  Name: [name] MRN: [id number] Date of Birth: 04-15-1935  Today's Date: 07/27/2022 Time: SLP Start Time (ACUTE ONLY): 1448 SLP Stop Time (ACUTE ONLY): 1530 SLP Time Calculation (min) (ACUTE ONLY): 42 min  Past Medical History:  Past Medical History:  Diagnosis Date   Chronic kidney disease    left nephrolithiasis   Hematuria    ureteral stone and stent   Hyperlipidemia    Hypertension    Right ureteral stone    Subdural hematoma (HCC) 02/12/2015   Past Surgical History:  Past Surgical History:  Procedure Laterality Date   CATARACT EXTRACTION W/ INTRAOCULAR LENS  IMPLANT, BILATERAL     CRANIOTOMY Right 02/12/2015   Procedure: [name] for HEMATOMA EVACUATION SUBDURAL;  Surgeon: Hilda Lias, MD;  Location: [facility];  Service: Neurosurgery;  Laterality: Right;   CYSTOSCOPY W/ URETERAL STENT PLACEMENT  01-02-11   right   CYSTOSCOPY WITH URETEROSCOPY  09/17/2011   Procedure: CYSTOSCOPY WITH URETEROSCOPY;  Surgeon: Milford Cage, MD;  Location: [facility];  Service: Urology;  Laterality: Right;  CYSTOSCOPY, RIGHT URETEROSCOPY WITH LASER LITHO AND STENT    EXTRACORPOREAL SHOCK WAVE LITHOTRIPSY  02-05-11   right   EXTRACORPOREAL SHOCK WAVE LITHOTRIPSY Right 05/12/2020   Procedure: EXTRACORPOREAL SHOCK WAVE LITHOTRIPSY (ESWL);  Surgeon: Alfredo Martinez, MD;  Location: [facility];  Service: Urology;  Laterality: Right;   LAPAROSCOPIC APPENDECTOMY N/A 07/20/2022   Procedure: APPENDECTOMY LAPAROSCOPIC;  Surgeon: Darnell Level, MD;  Location: [facility];  Service: General;  Laterality: N/A;   HPI:  87 yo male adm to [facility] with abdomen pain - found to have acute gangrenous appendicitis.  He is s/p appendectomy and was tolerating soft/thin diet per notes.  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 Swallow evaluation ordered as pt failed [facility] stroke swallow screen.  Pt has  h/o having undergone MBS .  Moderate esophageal dysmotility, with a presbyesophagus pattern per prior esophagram.    Assessment / Plan / Recommendation  Clinical Impression  Patient demonstrating clinical indications concerning for multifactorial dysphagia -Poor awareness to viscous white secretions noted in posterior oral cavity - which was eventually cleared with oral care and pt swallowing.  SLP also questions component of odynophagia - as pt winces with swallowing.  He denies discomfort and family reports he wants spicy food and dislikes items provided.  However posterior oral cavity appears erythemic and edematous.     Pt required significant stimulation with oral suction to produce palatal elevation and this along with weak cough and voice are concerning for vagus nerve involvement.  Poor oral control suspected with likely impaired oral transiting - *as noted on MBS 02/2022*.  Overt cough noted with tsp of thin liquids - concerning for aspiration.    Swallow judged to be delayed and pt produces multiple swallows with boluses - suspect oral and potentially cervical esophageal retention.    Recommend NPO x medicine with applesauce and tsps of nectar *floor stock* tonight - MBS planned tomorrow am with this SLP at [facility] at approx 0800 *if pt has not transferred to [facility].  Patient's brother was present and patient's sister, [name], assisted with interpretation as Ipad service does not include pt's language Gujarati.  All in agreement with plan. SLP Visit Diagnosis: Dysphagia, oropharyngeal phase (R13.12)    Aspiration Risk  Moderate aspiration risk;Risk for inadequate nutrition/hydration    Diet Recommendation Other (Comment);Nectar-thick liquid (floor stock only)  Liquid Administration via: Spoon Medication Administration: Crushed with puree (with applesauce) Supervision: Full supervision/cueing for compensatory strategies Compensations: Slow rate;Small sips/bites Postural Changes: Seated  upright at 90 degrees;Remain upright for at least 30 minutes after po intake    Other  Recommendations Oral Care Recommendations: Oral care BID Other Recommendations: Order thickener from pharmacy    Recommendations for follow up therapy are one component of a multi-disciplinary discharge planning process, led by the attending physician.  Recommendations may be updated based on patient status, additional functional criteria and insurance authorization.  Follow up Recommendations Skilled nursing-short term rehab (<3 hours/day)      Assistance Recommended at Discharge Frequent or constant Supervision/Assistance  Functional Status Assessment Patient has had a recent decline in their functional status and demonstrates the ability to make significant improvements in function in a reasonable and predictable amount of time.  Frequency and Duration min 1 x/week  1 week       Prognosis Prognosis for Safe Diet Advancement: Good Barriers to Reach Goals: Time post onset      Swallow Study   General Date of Onset: 07/26/22 HPI: 87 yo male adm to [facility] with abdomen pain - found to have acute gangrenous appendicitis.  He is s/p appendectomy and was tolerating soft/thin diet per notes.  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 Swallow evaluation ordered as pt failed [facility] stroke swallow screen.  Pt has h/o having undergone MBS .  Moderate esophageal dysmotility, with a presbyesophagus pattern per prior esophagram. Previous Swallow Assessment: OP MBS 02/2022 due to pt report of subtle dysphagia - mild oral deficits, functional pharyngeal, trace penetration of thin - flash, rec soft/thin Diet Prior to this Study: NPO Temperature Spikes Noted: No Respiratory Status: Room air History of Recent Intubation: No Behavior/Cognition: Alert;Cooperative Oral Cavity Assessment: Dry;Dried secretions;Excessive secretions (posterior pharynx white viscous secretions noted, pt eventually  cleared after SLP had obtained oral suction to remove) Oral Care Completed by SLP: Yes Oral Cavity - Dentition: Edentulous Vision: Functional for self-feeding Self-Feeding Abilities: Able to feed self Patient Positioning: Upright in bed Baseline Vocal Quality: Normal Volitional Cough: Weak Volitional Swallow: Unable to elicit    Oral/Motor/Sensory Function Overall Oral Motor/Sensory Function: Moderate impairment Facial ROM: Reduced right Facial Symmetry: Abnormal symmetry right Facial Strength: Reduced right Facial Sensation: Other (Comment) (dnt) Lingual ROM: Reduced right Lingual Symmetry: Abnormal symmetry right;Suspected CN XII (hypoglossal) dysfunction Lingual Strength: Reduced Velum: Other (comment) (diminished - required excessive stimulation) Mandible: Within Functional Limits   Ice Chips Ice chips: Impaired Presentation: Spoon Pharyngeal Phase Impairments: Suspected delayed Swallow   Thin Liquid Thin Liquid: Impaired Presentation: Spoon Pharyngeal  Phase Impairments: Cough - Immediate    Nectar Thick Nectar Thick Liquid: Impaired Presentation: Spoon Pharyngeal Phase Impairments: Suspected delayed Swallow;Multiple swallows   Honey Thick Honey Thick Liquid: Impaired Presentation: Cup Oral Phase Functional Implications: Prolonged oral transit Pharyngeal Phase Impairments: Suspected delayed Swallow;Multiple swallows   Puree Puree:  (jello) Oral Phase Impairments: Reduced labial seal;Reduced lingual movement/coordination Oral Phase Functional Implications: Prolonged oral transit Pharyngeal Phase Impairments: Suspected delayed Swallow;Multiple swallows   Solid     Solid: Not tested      Chales Abrahams 07/27/2022,4:18 PM  Rolena Infante, MS [facility] SLP Acute Rehab Services Office [phone] Pager [phone]

## 2022-07-27 NOTE — Progress Notes (Signed)
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  Family at bedside.  Patient still with garbled speech this am.  Appears abdominal pain is stable. No vomiting yesterday. BM yesterday. He is npo currently for slp eval and planned for transfer to [facility] for neuro eval.   Objective: Vital signs in last 24 hours: Temp:  [97.9 F (36.6 C)-98.3 F (36.8 C)] 98.3 F (36.8 C) (09/15 0429) Pulse Rate:  [85-94] 94 (09/15 0429) Resp:  [16-18] 18 (09/15 0429) BP: (124-140)/(86-95) 124/86 (09/15 0429) SpO2:  [93 %-94 %] 93 % (09/15 0429) Last BM Date : 07/26/22  Intake/Output from previous day: 09/14 0701 - 09/15 0700 In: 360 [P.O.:360] Out: 500 [Urine:500] Intake/Output this shift: Total I/O In: -  Out: 50 [Urine:50]  PE: Gen:  Alert Abd: Soft, no significant distension, +BS. Appears to have some ttp around his incisions but no rigidity or guarding. Incisions with glue intact appears well and are without drainage, bleeding, or signs of infection  Lab Results:  Recent Labs    07/27/22 0810  WBC 10.1  HGB 15.2  HCT 44.7  PLT 289   BMET Recent Labs    07/27/22 0810  NA 142  K 3.8  CL 104  CO2 29  GLUCOSE 158*  BUN 21  CREATININE 0.85  CALCIUM 9.5   PT/INR No results for input(s): "LABPROT", "INR" in the last 72 hours. CMP     Component Value Date/Time   NA 142 07/27/2022 0810   K 3.8 07/27/2022 0810   CL 104 07/27/2022 0810   CO2 29 07/27/2022 0810   GLUCOSE 158 (H) 07/27/2022 0810   BUN 21 07/27/2022 0810   CREATININE 0.85 07/27/2022 0810   CALCIUM 9.5 07/27/2022 0810   PROT 7.6 07/21/2022 1041   ALBUMIN 3.8 07/21/2022 1041   AST 44 (H) 07/21/2022 1041   ALT 22 07/21/2022 1041   ALKPHOS 28 (L) 07/21/2022 1041   BILITOT 1.5 (H) 07/21/2022 1041   GFRNONAA >60 07/27/2022 0810   GFRAA >60 04/28/2020 1546   Lipase     Component Value Date/Time   LIPASE 32 04/28/2020 1546    Studies/Results: CT ANGIO HEAD NECK W WO  CM  Result Date: 07/27/2022 CLINICAL DATA:  Acute neurologic deficit EXAM: CT ANGIOGRAPHY HEAD AND NECK TECHNIQUE: Multidetector CT imaging of the head and neck was performed using the standard protocol during bolus administration of intravenous contrast. Multiplanar CT image reconstructions and MIPs were obtained to evaluate the vascular anatomy. Carotid stenosis measurements (when applicable) are obtained utilizing NASCET criteria, using the distal internal carotid diameter as the denominator. RADIATION DOSE REDUCTION: This exam was performed according to the departmental dose-optimization program which includes automated exposure control, adjustment of the mA and/or kV according to patient size and/or use of iterative reconstruction technique. CONTRAST:  29mL OMNIPAQUE IOHEXOL 350 MG/ML SOLN COMPARISON:  None Available. FINDINGS: CT HEAD FINDINGS Brain: There is no mass, hemorrhage or extra-axial collection. There is generalized atrophy without lobar predilection. There is hypoattenuation of the periventricular white matter, most commonly indicating chronic ischemic microangiopathy. Skull: The visualized skull base, calvarium and extracranial soft tissues are normal. Sinuses/Orbits: No fluid levels or advanced mucosal thickening of the visualized paranasal sinuses. No mastoid or middle ear effusion. The orbits are normal. CTA NECK FINDINGS SKELETON: There is no bony spinal canal stenosis. No lytic or blastic lesion. OTHER NECK: Normal pharynx, larynx and major salivary glands. No cervical lymphadenopathy. Unremarkable thyroid gland. UPPER  CHEST: No pneumothorax or pleural effusion. No nodules or masses. AORTIC ARCH: There is calcific atherosclerosis of the aortic arch. There is no aneurysm, dissection or hemodynamically significant stenosis of the visualized portion of the aorta. Conventional 3 vessel aortic branching pattern. The visualized proximal subclavian arteries are widely patent. RIGHT CAROTID SYSTEM:  No dissection, occlusion or aneurysm. Mild atherosclerotic calcification at the carotid bifurcation without hemodynamically significant stenosis. LEFT CAROTID SYSTEM: No dissection, occlusion or aneurysm. Mild atherosclerotic calcification at the carotid bifurcation without hemodynamically significant stenosis. VERTEBRAL ARTERIES: Left dominant configuration. Both origins are clearly patent. There is no dissection, occlusion or flow-limiting stenosis to the skull base (V1-V3 segments). CTA HEAD FINDINGS POSTERIOR CIRCULATION: --Vertebral arteries: Normal V4 segments. --Inferior cerebellar arteries: Normal. --Basilar artery: Normal. --Superior cerebellar arteries: Normal. --Posterior cerebral arteries (PCA): Normal. ANTERIOR CIRCULATION: --Intracranial internal carotid arteries: Atherosclerotic calcification of the internal carotid arteries at the skull base without hemodynamically significant stenosis. --Anterior cerebral arteries (ACA): Normal. Both A1 segments are present. Patent anterior communicating artery (a-comm). --Middle cerebral arteries (MCA): Normal. VENOUS SINUSES: As permitted by contrast timing, patent. ANATOMIC VARIANTS: None Review of the MIP images confirms the above findings. IMPRESSION: 1. No emergent large vessel occlusion or hemodynamically significant stenosis of the head or neck. 2. Aortic Atherosclerosis (ICD10-I70.0). Electronically Signed   By: Deatra Robinson M.D.   On: 07/27/2022 00:33   MR BRAIN WO CONTRAST  Result Date: 07/26/2022 CLINICAL DATA:  Altered mental status.  Evaluate for stroke. EXAM: MRI HEAD WITHOUT CONTRAST TECHNIQUE: Multiplanar, multiecho pulse sequences of the brain and surrounding structures were obtained without intravenous contrast. COMPARISON:  Brain MRI 3 days prior and same-day head CT FINDINGS: Brain: There are numerous small acute infarcts in the left lentiform nucleus which are new since the study from 3 days prior without hemorrhage or mass effect. There  is no other evidence of acute infarct. There is no acute intracranial hemorrhage or extra-axial fluid collection. Background parenchymal volume loss with prominence of the ventricular system and extra-axial CSF spaces is unchanged the ventricles are stable in size. Confluent FLAIR signal abnormality in the supratentorial brain likely reflecting chronic small vessel ischemic change is stable. A remote infarct in the right external capsule and scattered punctate chronic microhemorrhages are stable. There is no mass lesion.  There is no mass effect or midline shift. Vascular: Normal flow voids. Skull and upper cervical spine: Normal marrow signal. Sinuses/Orbits: The paranasal sinuses are clear. Bilateral lens implants are in place. The globes and orbits are otherwise unremarkable. Other: None. IMPRESSION: Small acute infarcts in the left lentiform nucleus without hemorrhage or mass effect are new since 07/23/2022. Electronically Signed   By: Lesia Hausen M.D.   On: 07/26/2022 16:37   CT HEAD WO CONTRAST ( )  Result Date: 07/26/2022 CLINICAL DATA:  Mental status change, unknown cause. EXAM: CT HEAD WITHOUT CONTRAST TECHNIQUE: Contiguous axial images were obtained from the base of the skull through the vertex without intravenous contrast. RADIATION DOSE REDUCTION: This exam was performed according to the departmental dose-optimization program which includes automated exposure control, adjustment of the mA and/or kV according to patient size and/or use of iterative reconstruction technique. COMPARISON:  CT head 07/20/2022.  MRI head 07/23/2022. FINDINGS: Brain: There is no evidence of acute intracranial hemorrhage, mass lesion, brain edema or extra-axial fluid collection. Diffuse atrophy with prominence of the ventricles and subarachnoid spaces. There are chronic small vessel ischemic changes in the periventricular white matter which are unchanged from the recent prior studies. Dense  basal ganglia calcifications  are present bilaterally. There is no CT evidence of acute cortical infarction. Vascular: Prominent intracranial vascular calcifications. No hyperdense vessel identified. Skull: Right frontal parietal burr holes are again noted. No acute calvarial findings. Sinuses/Orbits: The visualized paranasal sinuses and mastoid air cells are clear. No orbital abnormalities are seen. Other: None. IMPRESSION: Stable examination without evidence of acute intracranial findings. Stable atrophy and chronic small vessel ischemic changes. Electronically Signed   By: Richardean Sale M.D.   On: 07/26/2022 11:24    Anti-infectives: Anti-infectives (From admission, onward)    Start     Dose/Rate Route Frequency Ordered Stop   07/21/22 0930  piperacillin-tazobactam (ZOSYN) IVPB 3.375 g        3.375 g 12.5 mL/hr over 240 Minutes Intravenous Every 8 hours 07/21/22 0839 07/26/22 0529   07/20/22 2030  ceFEPIme (MAXIPIME) 2 g in sodium chloride 0.9 % 100 mL IVPB  Status:  Discontinued       See Hyperspace for full Linked Orders Report.   2 g 200 mL/hr over 30 Minutes Intravenous Every 8 hours 07/20/22 1938 07/21/22 0839   07/20/22 2000  metroNIDAZOLE (FLAGYL) IVPB 500 mg  Status:  Discontinued       See Hyperspace for full Linked Orders Report.   500 mg 100 mL/hr over 60 Minutes Intravenous Every 12 hours 07/20/22 1938 07/21/22 0839   07/20/22 1145  metroNIDAZOLE (FLAGYL) IVPB 500 mg        500 mg 100 mL/hr over 60 Minutes Intravenous  Once 07/20/22 1136 07/20/22 1243   07/20/22 1015  cefTRIAXone (ROCEPHIN) 2 g in sodium chloride 0.9 % 100 mL IVPB  Status:  Discontinued        2 g 200 mL/hr over 30 Minutes Intravenous  Once 07/20/22 1000 07/20/22 1007   07/20/22 1015  cefTRIAXone (ROCEPHIN) 1 g in sodium chloride 0.9 % 100 mL IVPB  Status:  Discontinued        1 g 200 mL/hr over 30 Minutes Intravenous Every hour 07/20/22 1008 07/20/22 1214   07/20/22 1000  cefTRIAXone (ROCEPHIN) 1 g in sodium chloride 0.9 % 100 mL IVPB   Status:  Discontinued        1 g 200 mL/hr over 30 Minutes Intravenous  Once 07/20/22 0950 07/20/22 1037   07/20/22 0945  ceFEPIme (MAXIPIME) 2 g in sodium chloride 0.9 % 100 mL IVPB  Status:  Discontinued        2 g 200 mL/hr over 30 Minutes Intravenous  Once 07/20/22 0943 07/20/22 0950   07/20/22 0945  vancomycin (VANCOCIN) IVPB 1000 mg/200 mL premix  Status:  Discontinued        1,000 mg 200 mL/hr over 60 Minutes Intravenous  Once 07/20/22 [id number] 07/20/22 0950        Assessment/Plan POD 7 s/p Laparoscopic Appendectomy for Acute gangrenous perforated appendicitis - From an abdominal standpoint he appears stable. He completed 5d post op abx. WBC normalized. He was tolerating a diet without n/v and having bowel function. He is currently npo for slp eval after new CVA was found on MRI. Incisions cdi. Noted plans for transfer to [facility] for Neuro eval after new CVA finding. He is okay for ASA/Plavix from our standpoint.   FEN - NPO for SLP eval, IVF per [facility] VTE - SCDs, Lovenox, ASA/Plavix  ID - Zosyn 9/9 - 9/14. None currently.    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 Contaminant  Hypothyroidism  Hypertension   LOS: 6 days    Jacinto Halim , [facility] Surgery 07/27/2022, 9:34 AM Please see Amion for pager number during day hours 7:00am-4:30pm

## 2022-07-27 NOTE — Progress Notes (Signed)
Informed by assigned RN that patient was to have bedside RN swallow evaluation done today, but no documentation found that this was completed. Visited patient to perform focused assessment. Family member at bedside translated instructions to patient. RN bedside swallow and oral evaluation completed and documented in flowsheet. Patient swallowed ice chip without difficulty, but within twenty seconds of swallowing a teaspoon of water, patient coughing forcefully. Advised patient, family member at bedside, and assigned RN to keep patient NPO with frequent oral care and will need further evaluation by speech therapy. Patient, family, and assigned nurse denied further needs at this time. Patient is awaiting transfer to [facility] for stroke workup.

## 2022-07-27 NOTE — Progress Notes (Addendum)
PROGRESS NOTE    [name]  [id number] DOB: 04/23/1935 DOA: 07/20/2022 PCP: Donald Prose, MD    Chief Complaint  Patient presents with   Altered Mental Status   Urinary Frequency    Brief Narrative:   [name] is a pleasant 87 y.o. male with past medical history of hypertension, hypothyroidism, hyperlipidemia, nephrolithiasis, and SDH s/p evacuation in 2016, now presenting to the emergency department with lethargy, confusion, and fall. Initially, patient was admitted by surgery and taken to the OR  on 9/8 for laparoscopic appendectomy where he was found to have acute gangrenous perforated appendicitis.  He emerged from anesthesia hypertensive, tachycardic, wheezing, and hypoxic.  He had a chest x-ray concerning for developing pulmonary edema, was given 40 mg IV Lasix, Xopenex, and transferred to the ICU. Patient continued to make progress and he was transferred to the floor and to [facility] service.  Patient continued to be weak and deconditioned, MRI of the brain was done on 07/23/22 to evaluate for stroke, which was negative for acute intra cranial abnormalities.  He again developed trouble using the right upper extremity while the son at bedside on 9/14 and MRI was positive for CVA at that time.  Continues to await transfer to [facility] for neurology evaluation.    Assessment & Plan:   Principal Problem:   Acute gangrenous appendicitis s/p lap appendectomy 07/20/2022 Active Problems:   Ataxia   Hypertension   Hyperlipidemia   Hypothyroidism   Acute encephalopathy   Severe sepsis (HCC)   Acute respiratory failure with hypoxia (HCC)   Acute pulmonary edema ([place])   Non-English speaking patient   History of subdural hematoma   History of fall   Acute appendicitis   Acute CVA -symptom of RUE weakness and garbled speech -MRI positive on 9/14 -neurology consulted -on ASA but needs plavix--have discussed with GS, once able to take PO safely cans start plavix -continues to await  transfer to [facility] -A1C and lipid panel ordered this AM -NPO as failed swallow screen  Acute respiratory failure with hypoxia initially requiring 10L of [place] oxygen -improved with IV lasix Echocardiogram showed preserved LVEF.  -diurese as needed -currently on RA  Acute metabolic encephalopathy:  Patient had increased fatigue and was less verbal for ~1 wk before become delirious and lethargy.  TSH wnl, b12 levels low normal .  UA Is negative. RPR is Negative. Initial CT head does not show any acute intracranial abnormality.  Therapy eval recommending SNF. Toc aware.   Hypertension:  -will allow permissive HTN for now  Hypothyroidism;  Resume synthroid but change to IV   Sepsis secondary to perforated gangrenous appendicitis S/p appendectomy - Sepsis physiology has resolved. BP parameters have normalized.  Completed 7 days of IV antibiotics.  Leukocytosis resolved/Lactic acid resolved.    Bilateral foot pain: going on for many years.  Suspect neuropathy.  Would wait until he is back to baseline before starting him on Neurontin.    Staphylococcus species in 1 out of 4 blood culture bottles Likely a contaminant.  Patient already on zosyn.  Repeat blood cultures are negative.  Patient completed 7 days of antibiotics.      Subjective: MRI positive for CVA, failed bedside stroke screen  Objective: Vitals:   07/26/22 0600 07/26/22 1351 07/26/22 2202 07/27/22 0429  BP: 119/78 (!) 129/90 (!) 140/95 124/86  Pulse: 81 86 85 94  Resp: 18 18 16 18   Temp: 98.9 F (37.2 C) 98 F (36.7 C) 97.9 F (36.6 C) 98.3 F (  36.8 C)  TempSrc: Oral Oral Oral Oral  SpO2: 96% 94% 93% 93%  Weight:      Height:        Intake/Output Summary (Last 24 hours) at 07/27/2022 0715 Last data filed at 07/26/2022 2249 Gross per 24 hour  Intake 360 ml  Output 500 ml  Net -140 ml   Filed Weights   07/20/22 0847 07/20/22 2015  Weight: 77.1 kg 73.3 kg    Examination:  General: Appearance:      Overweight male in no acute distress     Lungs:     respirations unlabored  Heart:    Normal heart rate.   MS:   All extremities are intact.   Neurologic:   Follows commands but weak in RUE       Data Reviewed: I have personally reviewed following labs and imaging studies  CBC: Recent Labs  Lab 07/20/22 0900 07/21/22 1041 07/22/22 0248 07/24/22 0433  WBC 12.8* 15.0* 14.2* 7.8  NEUTROABS 10.6* 12.3* 10.8* 4.2  HGB 15.6 14.8 13.5 14.0  HCT 45.6 43.7 40.9 42.3  MCV 95.0 97.3 98.3 97.0  PLT 172 160 176 [id number]    Basic Metabolic Panel: Recent Labs  Lab 07/20/22 0900 07/21/22 1041 07/22/22 0248 07/23/22 0424 07/24/22 0433  NA 136 139 139 139 140  K 4.4 4.3 3.6 4.2 4.5  CL 101 102 102 103 101  CO2 25 29 29 29  32  GLUCOSE 186* 170* 145* 138* 150*  BUN 10 15 23 21 16   CREATININE 0.79 0.88 0.91 0.86 0.74  CALCIUM 9.3 8.8* 8.4* 8.6* 8.8*    GFR: Estimated Creatinine Clearance: 60.6 mL/min (by C-G formula based on SCr of 0.74 mg/dL).  Liver Function Tests: Recent Labs  Lab 07/20/22 0900 07/21/22 1041  AST 26 44*  ALT 18 22  ALKPHOS 32* 28*  BILITOT 1.5* 1.5*  PROT 7.6 7.6  ALBUMIN 4.1 3.8    CBG: Recent Labs  Lab 07/20/22 0924  GLUCAP 174*     Recent Results (from the past 240 hour(s))  Culture, blood (Routine x 2)     Status: None   Collection Time: 07/20/22  9:00 AM   Specimen: BLOOD  Result Value Ref Range Status   Specimen Description   Final    BLOOD RIGHT ANTECUBITAL Performed at [facility] [address], [place], [place] [address]    Special Requests   Final    BOTTLES DRAWN AEROBIC AND ANAEROBIC Blood Culture results may not be optimal due to an excessive volume of blood received in culture bottles Performed at [facility] [address], [place], [place] [address]    Culture   Final    NO GROWTH 5 DAYS Performed at [facility], [place] [address] [place], [place] [address]    Report  Status 07/25/2022 FINAL  Final  Urine Culture     Status: None   Collection Time: 07/20/22  9:00 AM   Specimen: In/Out Cath Urine  Result Value Ref Range Status   Specimen Description   Final    IN/OUT CATH URINE Performed at [facility] [address], [place], [place] [address]    Special Requests   Final    NONE Performed at [facility], [place] [address] [place], [place] [address]    Culture   Final    NO GROWTH Performed at [facility], [place] [address], [place], [place] [address]  Report Status 07/21/2022 FINAL  Final  Culture, blood (Routine x 2)     Status: Abnormal   Collection Time: 07/20/22  9:12 AM   Specimen: BLOOD  Result Value Ref Range Status   Specimen Description BLOOD LEFT ANTECUBITAL  Final   Special Requests   Final    BOTTLES DRAWN AEROBIC AND ANAEROBIC Blood Culture results may not be optimal due to an excessive volume of blood received in culture bottles   Culture  Setup Time   Final    GRAM POSITIVE COCCI IN CLUSTERS ANAEROBIC BOTTLE ONLY CRITICAL RESULT CALLED TO, READ BACK BY AND VERIFIED WITH: PHARMD E WILLIAMSON [id number] AT 41 BY CM    Culture (A)  Final    STAPHYLOCOCCUS AURICULARIS THE SIGNIFICANCE OF ISOLATING THIS ORGANISM FROM A SINGLE SET OF BLOOD CULTURES WHEN MULTIPLE SETS ARE DRAWN IS UNCERTAIN. PLEASE NOTIFY THE MICROBIOLOGY DEPARTMENT WITHIN ONE WEEK IF SPECIATION AND SENSITIVITIES ARE REQUIRED.    Report Status 07/23/2022 FINAL  Final  Blood Culture ID Panel (Reflexed)     Status: Abnormal   Collection Time: 07/20/22  9:12 AM  Result Value Ref Range Status   Enterococcus faecalis NOT DETECTED NOT DETECTED Final   Enterococcus Faecium NOT DETECTED NOT DETECTED Final   Listeria monocytogenes NOT DETECTED NOT DETECTED Final   Staphylococcus species DETECTED (A) NOT DETECTED Final    Comment: CRITICAL RESULT CALLED TO, READ BACK BY AND VERIFIED WITH: PHARMD E WILLIAMSON [id number] AT 1752 BY CM     Staphylococcus aureus (BCID) NOT DETECTED NOT DETECTED Final   Staphylococcus epidermidis NOT DETECTED NOT DETECTED Final   Staphylococcus lugdunensis NOT DETECTED NOT DETECTED Final   Streptococcus species NOT DETECTED NOT DETECTED Final   Streptococcus agalactiae NOT DETECTED NOT DETECTED Final   Streptococcus pneumoniae NOT DETECTED NOT DETECTED Final   Streptococcus pyogenes NOT DETECTED NOT DETECTED Final   A.calcoaceticus-baumannii NOT DETECTED NOT DETECTED Final   Bacteroides fragilis NOT DETECTED NOT DETECTED Final   Enterobacterales NOT DETECTED NOT DETECTED Final   Enterobacter cloacae complex NOT DETECTED NOT DETECTED Final   Escherichia coli NOT DETECTED NOT DETECTED Final   Klebsiella aerogenes NOT DETECTED NOT DETECTED Final   Klebsiella oxytoca NOT DETECTED NOT DETECTED Final   Klebsiella pneumoniae NOT DETECTED NOT DETECTED Final   Proteus species NOT DETECTED NOT DETECTED Final   Salmonella species NOT DETECTED NOT DETECTED Final   Serratia marcescens NOT DETECTED NOT DETECTED Final   Haemophilus influenzae NOT DETECTED NOT DETECTED Final   Neisseria meningitidis NOT DETECTED NOT DETECTED Final   Pseudomonas aeruginosa NOT DETECTED NOT DETECTED Final   Stenotrophomonas maltophilia NOT DETECTED NOT DETECTED Final   Candida albicans NOT DETECTED NOT DETECTED Final   Candida auris NOT DETECTED NOT DETECTED Final   Candida glabrata NOT DETECTED NOT DETECTED Final   Candida krusei NOT DETECTED NOT DETECTED Final   Candida parapsilosis NOT DETECTED NOT DETECTED Final   Candida tropicalis NOT DETECTED NOT DETECTED Final   Cryptococcus neoformans/gattii NOT DETECTED NOT DETECTED Final    Comment: Performed at [facility], [address], [place], [place] [address]  Resp Panel by RT-PCR (Flu A&B, Covid) Anterior Nasal Swab     Status: None   Collection Time: 07/20/22  9:21 AM   Specimen: Anterior Nasal Swab  Result Value Ref Range Status   SARS Coronavirus 2 by RT  PCR NEGATIVE NEGATIVE Final    Comment: (NOTE) SARS-CoV-2 target nucleic acids are NOT DETECTED.  The SARS-CoV-2 RNA  is generally detectable in upper respiratory specimens during the acute phase of infection. The lowest concentration of SARS-CoV-2 viral copies this assay can detect is 138 copies/mL. A negative result does not preclude SARS-Cov-2 infection and should not be used as the sole basis for treatment or other patient management decisions. A negative result may occur with  improper specimen collection/handling, submission of specimen other than nasopharyngeal swab, presence of viral mutation(s) within the areas targeted by this assay, and inadequate number of viral copies(<138 copies/mL). A negative result must be combined with clinical observations, patient history, and epidemiological information. The expected result is Negative.  Fact Sheet for Patients:  BloggerCourse.com  Fact Sheet for Healthcare Providers:  SeriousBroker.it  This test is no t yet approved or cleared by the [place] FDA and  has been authorized for detection and/or diagnosis of SARS-CoV-2 by FDA under an Emergency Use Authorization (EUA). This EUA will remain  in effect (meaning this test can be used) for the duration of the COVID-19 declaration under Section 564(b)(1) of the Act, 21 U.S.C.section 360bbb-3(b)(1), unless the authorization is terminated  or revoked sooner.       Influenza A by PCR NEGATIVE NEGATIVE Final   Influenza B by PCR NEGATIVE NEGATIVE Final    Comment: (NOTE) The Xpert Xpress SARS-CoV-2/FLU/RSV plus assay is intended as an aid in the diagnosis of influenza from Nasopharyngeal swab specimens and should not be used as a sole basis for treatment. Nasal washings and aspirates are unacceptable for Xpert Xpress SARS-CoV-2/FLU/RSV testing.  Fact Sheet for Patients: BloggerCourse.com  Fact Sheet for  Healthcare Providers: SeriousBroker.it  This test is not yet approved or cleared by the [place] FDA and has been authorized for detection and/or diagnosis of SARS-CoV-2 by FDA under an Emergency Use Authorization (EUA). This EUA will remain in effect (meaning this test can be used) for the duration of the COVID-19 declaration under Section 564(b)(1) of the Act, 21 U.S.C. section 360bbb-3(b)(1), unless the authorization is terminated or revoked.  Performed at [facility], [address], [place], [place] [address]   MRSA Next Gen by PCR, Nasal     Status: None   Collection Time: 07/20/22  7:40 PM   Specimen: Nasal Mucosa; Nasal Swab  Result Value Ref Range Status   MRSA by PCR Next Gen NOT DETECTED NOT DETECTED Final    Comment: (NOTE) The GeneXpert MRSA Assay (FDA approved for NASAL specimens only), is one component of a comprehensive MRSA colonization surveillance program. It is not intended to diagnose MRSA infection nor to guide or monitor treatment for MRSA infections. Test performance is not FDA approved in patients less than 43 years old. Performed at [facility], [address], [place], [place] [address]   Culture, blood (Routine X 2) w Reflex to ID Panel     Status: None (Preliminary result)   Collection Time: 07/25/22 12:36 PM   Specimen: BLOOD RIGHT ARM  Result Value Ref Range Status   Specimen Description   Final    BLOOD RIGHT ARM Performed at [facility], [address], [place], [place] [address]    Special Requests   Final    BOTTLES DRAWN AEROBIC ONLY Blood Culture results may not be optimal due to an inadequate volume of blood received in culture bottles Performed at [facility], [address] [place], [place] [address]    Culture   Final    NO GROWTH < 24 HOURS Performed at  [facility], [place] [address], [place], [place] [address]     Report Status PENDING  Incomplete  Culture, blood (Routine X 2) w Reflex to ID Panel     Status: None (Preliminary result)   Collection Time: 07/25/22 12:36 PM   Specimen: BLOOD RIGHT HAND  Result Value Ref Range Status   Specimen Description   Final    BLOOD RIGHT HAND Performed at [facility] [address], [place], [place] [address]    Special Requests   Final    BOTTLES DRAWN AEROBIC ONLY Blood Culture results may not be optimal due to an inadequate volume of blood received in culture bottles Performed at [facility] [address] [place], [place] [address]    Culture   Final    NO GROWTH < 24 HOURS Performed at [facility] [address], [place], [place] [address]    Report Status PENDING  Incomplete         Radiology Studies: CT ANGIO HEAD NECK W WO CM  Result Date: 07/27/2022 CLINICAL DATA:  Acute neurologic deficit EXAM: CT ANGIOGRAPHY HEAD AND NECK TECHNIQUE: Multidetector CT imaging of the head and neck was performed using the standard protocol during bolus administration of intravenous contrast. Multiplanar CT image reconstructions and MIPs were obtained to evaluate the vascular anatomy. Carotid stenosis measurements (when applicable) are obtained utilizing NASCET criteria, using the distal internal carotid diameter as the denominator. RADIATION DOSE REDUCTION: This exam was performed according to the departmental dose-optimization program which includes automated exposure control, adjustment of the mA and/or kV according to patient size and/or use of iterative reconstruction technique. CONTRAST:  76mL OMNIPAQUE IOHEXOL 350 MG/ML SOLN COMPARISON:  None Available. FINDINGS: CT HEAD FINDINGS Brain: There is no mass, hemorrhage or extra-axial collection. There is generalized atrophy without lobar predilection. There is hypoattenuation of the periventricular white matter, most commonly indicating chronic ischemic microangiopathy.  Skull: The visualized skull base, calvarium and extracranial soft tissues are normal. Sinuses/Orbits: No fluid levels or advanced mucosal thickening of the visualized paranasal sinuses. No mastoid or middle ear effusion. The orbits are normal. CTA NECK FINDINGS SKELETON: There is no bony spinal canal stenosis. No lytic or blastic lesion. OTHER NECK: Normal pharynx, larynx and major salivary glands. No cervical lymphadenopathy. Unremarkable thyroid gland. UPPER CHEST: No pneumothorax or pleural effusion. No nodules or masses. AORTIC ARCH: There is calcific atherosclerosis of the aortic arch. There is no aneurysm, dissection or hemodynamically significant stenosis of the visualized portion of the aorta. Conventional 3 vessel aortic branching pattern. The visualized proximal subclavian arteries are widely patent. RIGHT CAROTID SYSTEM: No dissection, occlusion or aneurysm. Mild atherosclerotic calcification at the carotid bifurcation without hemodynamically significant stenosis. LEFT CAROTID SYSTEM: No dissection, occlusion or aneurysm. Mild atherosclerotic calcification at the carotid bifurcation without hemodynamically significant stenosis. VERTEBRAL ARTERIES: Left dominant configuration. Both origins are clearly patent. There is no dissection, occlusion or flow-limiting stenosis to the skull base (V1-V3 segments). CTA HEAD FINDINGS POSTERIOR CIRCULATION: --Vertebral arteries: Normal V4 segments. --Inferior cerebellar arteries: Normal. --Basilar artery: Normal. --Superior cerebellar arteries: Normal. --Posterior cerebral arteries (PCA): Normal. ANTERIOR CIRCULATION: --Intracranial internal carotid arteries: Atherosclerotic calcification of the internal carotid arteries at the skull base without hemodynamically significant stenosis. --Anterior cerebral arteries (ACA): Normal. Both A1 segments are present. Patent anterior communicating artery (a-comm). --Middle cerebral arteries (MCA): Normal. VENOUS SINUSES: As  permitted by contrast timing, patent. ANATOMIC VARIANTS: None Review of the MIP images confirms the above findings.  IMPRESSION: 1. No emergent large vessel occlusion or hemodynamically significant stenosis of the head or neck. 2. Aortic Atherosclerosis (ICD10-I70.0). Electronically Signed   By: Ulyses Jarred M.D.   On: 07/27/2022 00:33   MR BRAIN WO CONTRAST  Result Date: 07/26/2022 CLINICAL DATA:  Altered mental status.  Evaluate for stroke. EXAM: MRI HEAD WITHOUT CONTRAST TECHNIQUE: Multiplanar, multiecho pulse sequences of the brain and surrounding structures were obtained without intravenous contrast. COMPARISON:  Brain MRI 3 days prior and same-day head CT FINDINGS: Brain: There are numerous small acute infarcts in the left lentiform nucleus which are new since the study from 3 days prior without hemorrhage or mass effect. There is no other evidence of acute infarct. There is no acute intracranial hemorrhage or extra-axial fluid collection. Background parenchymal volume loss with prominence of the ventricular system and extra-axial CSF spaces is unchanged the ventricles are stable in size. Confluent FLAIR signal abnormality in the supratentorial brain likely reflecting chronic small vessel ischemic change is stable. A remote infarct in the right external capsule and scattered punctate chronic microhemorrhages are stable. There is no mass lesion.  There is no mass effect or midline shift. Vascular: Normal flow voids. Skull and upper cervical spine: Normal marrow signal. Sinuses/Orbits: The paranasal sinuses are clear. Bilateral lens implants are in place. The globes and orbits are otherwise unremarkable. Other: None. IMPRESSION: Small acute infarcts in the left lentiform nucleus without hemorrhage or mass effect are new since 07/23/2022. Electronically Signed   By: Valetta Mole M.D.   On: 07/26/2022 16:37   CT HEAD WO CONTRAST (5MM)  Result Date: 07/26/2022 CLINICAL DATA:  Mental status change, unknown  cause. EXAM: CT HEAD WITHOUT CONTRAST TECHNIQUE: Contiguous axial images were obtained from the base of the skull through the vertex without intravenous contrast. RADIATION DOSE REDUCTION: This exam was performed according to the departmental dose-optimization program which includes automated exposure control, adjustment of the mA and/or kV according to patient size and/or use of iterative reconstruction technique. COMPARISON:  CT head 07/20/2022.  MRI head 07/23/2022. FINDINGS: Brain: There is no evidence of acute intracranial hemorrhage, mass lesion, brain edema or extra-axial fluid collection. Diffuse atrophy with prominence of the ventricles and subarachnoid spaces. There are chronic small vessel ischemic changes in the periventricular white matter which are unchanged from the recent prior studies. Dense basal ganglia calcifications are present bilaterally. There is no CT evidence of acute cortical infarction. Vascular: Prominent intracranial vascular calcifications. No hyperdense vessel identified. Skull: Right frontal parietal burr holes are again noted. No acute calvarial findings. Sinuses/Orbits: The visualized paranasal sinuses and mastoid air cells are clear. No orbital abnormalities are seen. Other: None. IMPRESSION: Stable examination without evidence of acute intracranial findings. Stable atrophy and chronic small vessel ischemic changes. Electronically Signed   By: Richardean Sale M.D.   On: 07/26/2022 11:24        Scheduled Meds:  acetaminophen  1,000 mg Oral TID   aspirin EC  81 mg Oral q AM   Chlorhexidine Gluconate Cloth  6 each Topical Q0600   enoxaparin (LOVENOX) injection  40 mg Subcutaneous Q24H   lactose free nutrition  237 mL Oral TID WC   levothyroxine  50 mcg Oral QAC breakfast   lip balm   Topical BID   multivitamin with minerals  1 tablet Oral Daily   mouth rinse  15 mL Mouth Rinse 4 times per day   polycarbophil  625 mg Oral BID   Continuous Infusions:  sodium chloride  Stopped (  07/21/22 1432)   methocarbamol (ROBAXIN) IV     ondansetron (ZOFRAN) IV       LOS: 6 days        Joseph Art, DO [facility]   To contact the attending provider between 7A-7P or the covering provider during after hours 7P-7A, please log into the web site [email or web address] and access using universal [place] password for that web site. If you do not have the password, please call the hospital operator.  07/27/2022, 7:15 AM

## 2022-07-28 ENCOUNTER — Inpatient Hospital Stay (HOSPITAL_COMMUNITY): Payer: Medicare Other

## 2022-07-28 DIAGNOSIS — K35891 Other acute appendicitis without perforation, with gangrene: Secondary | ICD-10-CM | POA: Diagnosis not present

## 2022-07-28 LAB — BASIC METABOLIC PANEL
Anion gap: 9 (ref 5–15)
BUN: 31 mg/dL — ABNORMAL HIGH (ref 8–23)
CO2: 30 mmol/L (ref 22–32)
Calcium: 9.5 mg/dL (ref 8.9–10.3)
Chloride: 106 mmol/L (ref 98–111)
Creatinine, Ser: 0.94 mg/dL (ref 0.61–1.24)
GFR, Estimated: >60 mL/min (ref >60)
Glucose, Bld: 152 mg/dL — ABNORMAL HIGH (ref 70–99)
Potassium: 3.6 mmol/L (ref 3.5–5.1)
Sodium: 145 mmol/L (ref 135–145)

## 2022-07-28 LAB — CBC
HCT: 44.9 % (ref 39.0–52.0)
Hemoglobin: 14.7 g/dL (ref 13.0–17.0)
MCH: 32.1 pg (ref 26.0–34.0)
MCHC: 32.7 g/dL (ref 30.0–36.0)
MCV: 98 fL (ref 80.0–100.0)
Platelets: 280 10*3/uL (ref 150–400)
RBC: 4.58 MIL/uL (ref 4.22–5.81)
RDW: 12.8 % (ref 11.5–15.5)
WBC: 6.9 10*3/uL (ref 4.0–10.5)
nRBC: 0 % (ref 0.0–0.2)

## 2022-07-28 MED ORDER — LEVOTHYROXINE SODIUM 50 MCG PO TABS
50.0000 ug | ORAL_TABLET | Freq: Every day | ORAL | Status: DC
  Administered 2022-07-28 – 2022-07-31 (×4): 50 ug via ORAL
  Filled 2022-07-28 (×4): qty 1

## 2022-07-28 NOTE — Progress Notes (Signed)
Modified Barium Swallow Progress Note  Patient Details  Name: [name] MRN: [id number] Date of Birth: October 07, 1935  Today's Date: 07/28/2022  Modified Barium Swallow completed.  Full report located under Chart Review in the Imaging Section.  Brief recommendations include the following:  Clinical Impression  Patient's brother was present t/o MBS and assisted with interpretation for pt.  Pt presents with moderately severe oropharyngeal dysphagia with sensorimotor deficits due to his CVA.  He demonstrates weak lingual contraction/control resulting in nearly absent "mastication", prolonged oral transiting and weak propulsion.  Oral retention of secretions mixed with barium observed post-swallow.  Pharyngeal swallow marked by inconsistently impaired tongue base retraction resulting in vallecular retention.  Partial chin tuck posture and attempt at head turn right *difficult for pt to perform - (suspect language barrier contributed)- did not improve pharyngeal clearance.  Trace aspiration of thin with sequential swallows noted due to impaired prolonged laryngeal closure.  Pt did not sense trace aspiration nor vallecular retention and cued cough/expectoration attempt was produced but was very weak and ineffective to clear.  Very poor "mastication" of cracker noted with gross retention in valleculae elevating pt.'s aspiration risk with solids.  Recommend dys1/extra gravies/sauces and thin with strict precautions.  Oral suction after meals due to oral retention of secretions.  Will follow for skilled dysphagia management.  During testing, SLP educated pt and his brother to findings/recommendations using video monitor for feedback.  Advised him that if he is coughing, suspect highly he is aspirating and that it will be prudent to use caution - taking small single sips.   Swallow Evaluation Recommendations       SLP Diet Recommendations: Dysphagia 1 (Puree) solids;Thin liquid   Liquid Administration  via: Cup;Straw-- SMALL SINGLE SIPS!    Medication Administration: Crushed with puree       Compensations: Slow rate;Small sips/bites;Other (Comment) (oral suction after meals please) ALLOW TIME FOR PT TO SWALLOW< DELAYED!!!!   Postural Changes: Seated upright at 90 degrees;Remain semi-upright after after feeds/meals (Comment)   Oral Care Recommendations: Oral care BID     Kathleen Lime, MS [facility] SLP Acute Rehab Services Office [phone] Pager [phone]    Macario Golds 07/28/2022,8:50 AM

## 2022-07-28 NOTE — Progress Notes (Signed)
NP Lennox Grumbles was notified that patient is a yellow MEWs, HR is 113 and RR is 22. Patient states he has no pain. Protocol started.

## 2022-07-28 NOTE — Progress Notes (Signed)
PROGRESS NOTE Elson AreasBansidhar Jeremiah  NGE:[id number] DOB: 06/29/1935 DOA: 07/20/2022 PCP: Deatra JamesSun, Vyvyan, MD   Brief Narrative/Hospital Course: 87 y.o. male with past medical history of hypertension, hypothyroidism, hyperlipidemia, nephrolithiasis, and SDH s/p evacuation in 2016, now presenting to the emergency department with lethargy, confusion, and fall. Initially, patient was admitted by surgery and taken to the OR  on 9/8 for laparoscopic appendectomy where he was found to have acute gangrenous perforated appendicitis.  He emerged from anesthesia hypertensive, tachycardic, wheezing, and hypoxic.  He had a chest x-ray concerning for developing pulmonary edema, was given 40 mg IV Lasix, Xopenex, and transferred to the ICU. Patient continued to make progress and [facility] the [facility]  Patient continued to be weak and deconditioned, MRI of the brain was done on 07/23/22 to evaluate for stroke, which was negative for acute intra cranial abnormalities.  He again developed trouble using the right upper extremity while the son at bedside on 9/14 and MRI was positive for CVA at that time.  [facility]    Subjective: Seen and examined.  Son at the bedside.  Patient alert awake follows commands unable to move RUE/RLE, on room air. [facility]   Assessment and Plan: Principal Problem:   Acute gangrenous appendicitis s/p lap appendectomy 07/20/2022 Active Problems:   Ataxia   Hypertension   Hyperlipidemia   Hypothyroidism   Acute encephalopathy   Severe sepsis (HCC)   Acute respiratory failure with hypoxia (HCC)   Acute pulmonary edema (HCC)   Non-English speaking patient   History of subdural hematoma   History of fall   Acute appendicitis   Acute CVA with right side weakness, dysphagia dysarthria: MRI positive on 9/14-new small acute infarcts L lentiform nucleus,neurology was consulted>pending  transfer to [facility]-advised Plavix, surgery okay with aspirin/Plavix.speech eval completed able to take dysphagia 1 diet and on recatl asa/po Plavix, high intensity statin.A1c 6.6, LDL 125 not at goal.  [facility]  Continue intensive PT OT    Acute respiratory failure with hypoxia: Needing up to 10 L [place] initially. Improved with Lasix currently on room air echo with normal EF   Acute metabolic encephalopathy w/ fatigue less verbal x1 week and also delirium and lethargy: Work-up with normal TSH B12 low normal no UTI. RPR is Negative. Initial CT head does not show any acute intracranial abnormality. Now has stroke, cont ptot nsf   Hypertension: BP stable we will aim for normotension after initial permissive hypertension phase.   Hypothyroidism; TSH is stable continue Synthroid IV  Perforated gangrenous appendicitis Sepsis  2/2 above: S/p appendectomy.  Vitals stable.  Completed 7 days of antibiotics.  Seen by surgery.   Bilateral foot pain chronic for many years: likely neuropathy.Would wait until he is back to baseline before starting him on Neurontin.    Staphylococcus species in 1 out of 4 blood culture bottles Likely a contaminant.  S/p antibiotics and blood cultures repeat was negative   DVT prophylaxis: enoxaparin (LOVENOX) injection 40 mg Start: 07/23/22 1000 SCDs Start: 07/23/22 0853 SCD's Start: 07/20/22 1939 Code Status:   Code Status: Full Code Family Communication: plan of care discussed with patient/son at bedside. Patient status is: Inpatient because of stroke work-up Level of care: Telemetry Medical   Dispo: The patient is from: HOME            Anticipated disposition: TBD, likely needs  snf  in Objective: Vitals last 24 hrs: Vitals:   07/27/22 0429 07/27/22 1156 07/27/22 2055 07/28/22 0613  BP: 124/86 (!) 127/90 (!) 141/97 (!) 153/78  Pulse: 94 87 87 67  Resp: 18 18 16 16   Temp: 98.3 F (36.8 C) 97.8 F (36.6 C) 98 F (36.7 C) (!) 97.4 F  (36.3 C)  TempSrc: Oral Oral Oral Oral  SpO2: 93% 96% 100% 94%  Weight:      Height:       Weight change:   Physical Examination: General exam: alert awake,older than stated age, weak appearing. HEENT:Oral mucosa moist, Ear/Nose WNL grossly, dentition normal. Respiratory system: bilaterally clear BS, no use of accessory muscle Cardiovascular system: S1 & S2 +, No JVD. Gastrointestinal system: Abdomen soft,NT,ND, BS+ Nervous System:Alert, awake, able to tell me his name, unable to move his right side able to move left side well  Extremities: LE edema NEG distal peripheral pulses palpable.  Skin: No rashes,no icterus. MSK: Normal muscle bulk,tone, power  Medications reviewed:  Scheduled Meds:  acetaminophen  1,000 mg Oral TID   aspirin  300 mg Rectal Daily   atorvastatin  80 mg Oral Daily   clopidogrel  75 mg Oral Daily   enoxaparin (LOVENOX) injection  40 mg Subcutaneous Q24H   lactose free nutrition  237 mL Oral TID WC   levothyroxine  50 mcg Oral Q0600   lip balm   Topical BID   mouth rinse  15 mL Mouth Rinse 4 times per day   polycarbophil  625 mg Oral BID   Continuous Infusions:  sodium chloride Stopped (07/21/22 1432)   methocarbamol (ROBAXIN) IV     ondansetron (ZOFRAN) IV      Diet Order             DIET - DYS 1 Room service appropriate? Yes; Fluid consistency: Thin  Diet effective now                 Nutrition Problem: Increased nutrient needs Etiology: post-op healing Signs/Symptoms: estimated needs Interventions: Boost Plus   Intake/Output Summary (Last 24 hours) at 07/28/2022 0959 Last data filed at 07/28/2022 0600 Gross per 24 hour  Intake 0 ml  Output 100 ml  Net -100 ml   Net IO Since Admission: -6,275.9 mL [07/28/22 0959]  Wt Readings from Last 3 Encounters:  07/20/22 73.3 kg  05/12/20 70.4 kg  02/14/15 70.9 kg     Unresulted Labs (From admission, onward)    None     Data Reviewed: I have personally reviewed following labs and  imaging studies CBC: Recent Labs  Lab 07/21/22 1041 07/22/22 0248 07/24/22 0433 07/27/22 0810 07/28/22 0721  WBC 15.0* 14.2* 7.8 10.1 6.9  NEUTROABS 12.3* 10.8* 4.2  --   --   HGB 14.8 13.5 14.0 15.2 14.7  HCT 43.7 40.9 42.3 44.7 44.9  MCV 97.3 98.3 97.0 96.3 98.0  PLT 160 176 208 289 280   Basic Metabolic Panel: Recent Labs  Lab 07/22/22 0248 07/23/22 0424 07/24/22 0433 07/27/22 0810 07/28/22 0721  NA 139 139 140 142 145  K 3.6 4.2 4.5 3.8 3.6  CL 102 103 101 104 106  CO2 29 29 32 29 30  GLUCOSE 145* 138* 150* 158* 152*  BUN 23 21 16 21  31*  CREATININE 0.91 0.86 0.74 0.85 0.94  CALCIUM 8.4* 8.6* 8.8* 9.5 9.5   GFR: Estimated Creatinine Clearance: 51.6 mL/min (by C-G formula based on SCr of 0.94 mg/dL). Liver Function Tests: Recent  Labs  Lab 07/21/22 1041  AST 44*  ALT 22  ALKPHOS 28*  BILITOT 1.5*  PROT 7.6  ALBUMIN 3.8   HbA1C: Recent Labs    07/27/22 0810  HGBA1C 6.6*   CBG: No results for input(s): "GLUCAP" in the last 168 hours. Lipid Profile: Recent Labs    07/27/22 0810  CHOL 192  HDL 27*  LDLCALC 125*  TRIG 198*  CHOLHDL 7.1  Sepsis Labs: Recent Labs  Lab 07/21/22 1041 07/21/22 1311 07/24/22 0433  LATICACIDVEN 2.1* 2.1* 1.2   Radiology Studies: DG Swallowing Func-Speech Pathology  Result Date: 07/28/2022 Table formatting from the original result was not included. Objective Swallowing Evaluation: Type of Study: MBS-Modified Barium Swallow Study  Patient Details Name: Exavier Lina MRN: [id number] Date of Birth: 1935-02-11 Today's Date: 07/28/2022 Time: SLP Start Time (ACUTE ONLY): 0745 -SLP Stop Time (ACUTE ONLY): 0818 SLP Time Calculation (min) (ACUTE ONLY): 33 min Past Medical History: Past Medical History: Diagnosis Date  Chronic kidney disease   left nephrolithiasis  Hematuria   ureteral stone and stent  Hyperlipidemia   Hypertension   Right ureteral stone   Subdural hematoma (HCC) 02/12/2015 Past Surgical History: Past Surgical  History: Procedure Laterality Date  CATARACT EXTRACTION W/ INTRAOCULAR LENS  IMPLANT, BILATERAL    CRANIOTOMY Right 02/12/2015  Procedure: [name] for HEMATOMA EVACUATION SUBDURAL;  Surgeon: Hilda Lias, MD;  Location: [facility];  Service: Neurosurgery;  Laterality: Right;  CYSTOSCOPY W/ URETERAL STENT PLACEMENT  01-02-11  right  CYSTOSCOPY WITH URETEROSCOPY  09/17/2011  Procedure: CYSTOSCOPY WITH URETEROSCOPY;  Surgeon: Milford Cage, MD;  Location: [facility];  Service: Urology;  Laterality: Right;  CYSTOSCOPY, RIGHT URETEROSCOPY WITH LASER LITHO AND STENT   EXTRACORPOREAL SHOCK WAVE LITHOTRIPSY  02-05-11  right  EXTRACORPOREAL SHOCK WAVE LITHOTRIPSY Right 05/12/2020  Procedure: EXTRACORPOREAL SHOCK WAVE LITHOTRIPSY (ESWL);  Surgeon: Alfredo Martinez, MD;  Location: [facility];  Service: Urology;  Laterality: Right;  LAPAROSCOPIC APPENDECTOMY N/A 07/20/2022  Procedure: APPENDECTOMY LAPAROSCOPIC;  Surgeon: Darnell Level, MD;  Location: [facility];  Service: General;  Laterality: N/A; HPI: 87 yo male adm [facility] with abdomen pain - found to have acute gangrenous appendicitis.  He is s/p appendectomy and was tolerating soft/thin diet per notes.  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 Swallow evaluation ordered as pt failed [facility] stroke swallow screen.  Pt has h/o having undergone MBS .  Moderate esophageal dysmotility, with a presbyesophagus pattern per prior esophagram.  Subjective: pt awake in chair, pt's brother present *he had spent the night*  Recommendations for follow up therapy are one component of a multi-disciplinary discharge planning process, led by the attending physician.  Recommendations may be updated based on patient status, additional functional criteria and insurance authorization. Assessment / Plan / Recommendation   07/28/2022   8:38 AM Clinical Impressions Clinical Impression Patient's brother was present t/o MBS and assisted  with interpretation for pt.  Pt presents with moderately severe oropharyngeal dysphagia with sensorimotor deficits due to his CVA.  He demonstrates weak lingual contraction/control resulting in nearly absent "mastication", prolonged oral transiting and weak propulsion.  Oral retention of secretions mixed with barium observed post-swallow.  Pharyngeal swallow marked by inconsistently impaired tongue base retraction resulting in vallecular retention.  Partial chin tuck posture and attempt at head turn right *difficult for pt to perform - (suspect language barrier contributed)- did not improve pharyngeal clearance.  Trace aspiration of thin with sequential swallows  noted due to impaired prolonged laryngeal closure.  Pt did not sense trace aspiration nor vallecular retention and cued cough/expectoration attempt was produced but was very weak and ineffective to clear.  Very poor "mastication" of cracker noted with gross retention in valleculae elevating pt.'s aspiration risk with solids.  Recommend dys1/extra gravies/sauces and thin with strict precautions.  Oral suction after meals due to oral retention of secretions.  Will follow for skilled dysphagia management.  During testing, SLP educated pt and his brother to findings/recommendations using video monitor for feedback.  Advised him that if he is coughing, suspect highly he is aspirating and that it will be prudent to use caution - taking small single sips. SLP Visit Diagnosis Dysphagia, oropharyngeal phase (R13.12) Impact on safety and function Moderate aspiration risk;Risk for inadequate nutrition/hydration     07/28/2022   8:38 AM Treatment Recommendations Treatment Recommendations Therapy as outlined in treatment plan below     07/28/2022   8:47 AM Prognosis Prognosis for Safe Diet Advancement Good   07/28/2022   8:38 AM Diet Recommendations SLP Diet Recommendations Dysphagia 1 (Puree) solids;Thin liquid Liquid Administration via Cup;Straw Medication Administration  Crushed with puree Compensations Slow rate;Small sips/bites;Other (Comment) Postural Changes Seated upright at 90 degrees;Remain semi-upright after after feeds/meals (Comment)     07/28/2022   8:38 AM Other Recommendations Oral Care Recommendations Oral care BID Follow Up Recommendations Home health SLP Assistance recommended at discharge Frequent or constant Supervision/Assistance Functional Status Assessment Patient has had a recent decline in their functional status and demonstrates the ability to make significant improvements in function in a reasonable and predictable amount of time.   07/28/2022   8:38 AM Frequency and Duration  Speech Therapy Frequency (ACUTE ONLY) min 1 x/week Treatment Duration 1 week     07/28/2022   8:25 AM Oral Phase Oral Phase Impaired Oral - Nectar Teaspoon Decreased bolus cohesion;Delayed oral transit;Weak lingual manipulation Oral - Nectar Cup Decreased bolus cohesion;Right anterior bolus loss;Delayed oral transit;Weak lingual manipulation Oral - Nectar Straw Decreased bolus cohesion;Right anterior bolus loss;Premature spillage;Delayed oral transit;Weak lingual manipulation Oral - Thin Teaspoon Decreased bolus cohesion;Delayed oral transit;Right anterior bolus loss;Premature spillage;Weak lingual manipulation Oral - Thin Cup Decreased bolus cohesion;Premature spillage;Delayed oral transit;Weak lingual manipulation Oral - Thin Straw Decreased bolus cohesion;Delayed oral transit;Premature spillage Oral - Puree Decreased bolus cohesion;Delayed oral transit;Premature spillage;Weak lingual manipulation;Lingual pumping;Lingual/palatal residue Oral - Mech Soft Decreased bolus cohesion;Impaired mastication;Delayed oral transit;Premature spillage;Weak lingual manipulation Oral Phase - Comment head turn to the right was not performed adequately and did not prevent retention; slight chin tuck posture with nectar furhter compromised ability for pt to propel boluses into phayrnx and did not  contribute to decreased vallecular retention    07/28/2022   8:26 AM Pharyngeal Phase Pharyngeal Phase Impaired Pharyngeal- Nectar Teaspoon Pharyngeal residue - valleculae;Reduced tongue base retraction Pharyngeal Material does not enter airway Pharyngeal- Nectar Cup Reduced epiglottic inversion;Pharyngeal residue - valleculae;Reduced tongue base retraction Pharyngeal Material does not enter airway Pharyngeal- Nectar Straw Pharyngeal residue - valleculae;Reduced tongue base retraction Pharyngeal Material does not enter airway Pharyngeal- Thin Teaspoon Reduced tongue base retraction;Reduced epiglottic inversion;Pharyngeal residue - valleculae Pharyngeal- Thin Cup Reduced airway/laryngeal closure;Reduced laryngeal elevation;Penetration/Apiration after swallow Pharyngeal Material enters airway, passes BELOW cords without attempt by patient to eject out (silent aspiration) Pharyngeal- Mechanical Soft Reduced epiglottic inversion;Reduced tongue base retraction;Pharyngeal residue - valleculae Pharyngeal Comment head turn to the right was not performed adequately and did not prevent retention; slight chin tuck posture with nectar furhter compromised ability  for pt to propel boluses into phayrnx and did not contribute to decreased vallecular retention; significantly weak tongue base retraction/pharyngeal swallow with cracker bolus allowed gross retention with initial swallow    07/28/2022   8:37 AM Cervical Esophageal Phase  Cervical Esophageal Phase Impaired Cervical Esophageal Comment Pt has known h/o esophageal dysmotility per prior esophagram; Upon esophageal sweep, pt appeared with minimal barium retention that mixed with secretions Rolena Infante, MS [facility] SLP Acute Rehab Services Office [phone] Pager [phone] Chales Abrahams 07/28/2022, 8:53 AM                     CT ANGIO HEAD NECK W WO CM  Result Date: 07/27/2022 CLINICAL DATA:  Acute neurologic deficit EXAM: CT ANGIOGRAPHY HEAD AND NECK TECHNIQUE:  Multidetector CT imaging of the head and neck was performed using the standard protocol during bolus administration of intravenous contrast. Multiplanar CT image reconstructions and MIPs were obtained to evaluate the vascular anatomy. Carotid stenosis measurements (when applicable) are obtained utilizing NASCET criteria, using the distal internal carotid diameter as the denominator. RADIATION DOSE REDUCTION: This exam was performed according to the departmental dose-optimization program which includes automated exposure control, adjustment of the mA and/or kV according to patient size and/or use of iterative reconstruction technique. CONTRAST:  75mL OMNIPAQUE IOHEXOL 350 MG/ML SOLN COMPARISON:  None Available. FINDINGS: CT HEAD FINDINGS Brain: There is no mass, hemorrhage or extra-axial collection. There is generalized atrophy without lobar predilection. There is hypoattenuation of the periventricular white matter, most commonly indicating chronic ischemic microangiopathy. Skull: The visualized skull base, calvarium and extracranial soft tissues are normal. Sinuses/Orbits: No fluid levels or advanced mucosal thickening of the visualized paranasal sinuses. No mastoid or middle ear effusion. The orbits are normal. CTA NECK FINDINGS SKELETON: There is no bony spinal canal stenosis. No lytic or blastic lesion. OTHER NECK: Normal pharynx, larynx and major salivary glands. No cervical lymphadenopathy. Unremarkable thyroid gland. UPPER CHEST: No pneumothorax or pleural effusion. No nodules or masses. AORTIC ARCH: There is calcific atherosclerosis of the aortic arch. There is no aneurysm, dissection or hemodynamically significant stenosis of the visualized portion of the aorta. Conventional 3 vessel aortic branching pattern. The visualized proximal subclavian arteries are widely patent. RIGHT CAROTID SYSTEM: No dissection, occlusion or aneurysm. Mild atherosclerotic calcification at the carotid bifurcation without  hemodynamically significant stenosis. LEFT CAROTID SYSTEM: No dissection, occlusion or aneurysm. Mild atherosclerotic calcification at the carotid bifurcation without hemodynamically significant stenosis. VERTEBRAL ARTERIES: Left dominant configuration. Both origins are clearly patent. There is no dissection, occlusion or flow-limiting stenosis to the skull base (V1-V3 segments). CTA HEAD FINDINGS POSTERIOR CIRCULATION: --Vertebral arteries: Normal V4 segments. --Inferior cerebellar arteries: Normal. --Basilar artery: Normal. --Superior cerebellar arteries: Normal. --Posterior cerebral arteries (PCA): Normal. ANTERIOR CIRCULATION: --Intracranial internal carotid arteries: Atherosclerotic calcification of the internal carotid arteries at the skull base without hemodynamically significant stenosis. --Anterior cerebral arteries (ACA): Normal. Both A1 segments are present. Patent anterior communicating artery (a-comm). --Middle cerebral arteries (MCA): Normal. VENOUS SINUSES: As permitted by contrast timing, patent. ANATOMIC VARIANTS: None Review of the MIP images confirms the above findings. IMPRESSION: 1. No emergent large vessel occlusion or hemodynamically significant stenosis of the head or neck. 2. Aortic Atherosclerosis (ICD10-I70.0). Electronically Signed   By: Deatra Robinson M.D.   On: 07/27/2022 00:33   MR BRAIN WO CONTRAST  Result Date: 07/26/2022 CLINICAL DATA:  Altered mental status.  Evaluate for stroke. EXAM: MRI HEAD WITHOUT CONTRAST TECHNIQUE: Multiplanar, multiecho pulse sequences of the brain  and surrounding structures were obtained without intravenous contrast. COMPARISON:  Brain MRI 3 days prior and same-day head CT FINDINGS: Brain: There are numerous small acute infarcts in the left lentiform nucleus which are new since the study from 3 days prior without hemorrhage or mass effect. There is no other evidence of acute infarct. There is no acute intracranial hemorrhage or extra-axial fluid  collection. Background parenchymal volume loss with prominence of the ventricular system and extra-axial CSF spaces is unchanged the ventricles are stable in size. Confluent FLAIR signal abnormality in the supratentorial brain likely reflecting chronic small vessel ischemic change is stable. A remote infarct in the right external capsule and scattered punctate chronic microhemorrhages are stable. There is no mass lesion.  There is no mass effect or midline shift. Vascular: Normal flow voids. Skull and upper cervical spine: Normal marrow signal. Sinuses/Orbits: The paranasal sinuses are clear. Bilateral lens implants are in place. The globes and orbits are otherwise unremarkable. Other: None. IMPRESSION: Small acute infarcts in the left lentiform nucleus without hemorrhage or mass effect are new since 07/23/2022. Electronically Signed   By: Valetta Mole M.D.   On: 07/26/2022 16:37   CT HEAD WO CONTRAST (5MM)  Result Date: 07/26/2022 CLINICAL DATA:  Mental status change, unknown cause. EXAM: CT HEAD WITHOUT CONTRAST TECHNIQUE: Contiguous axial images were obtained from the base of the skull through the vertex without intravenous contrast. RADIATION DOSE REDUCTION: This exam was performed according to the departmental dose-optimization program which includes automated exposure control, adjustment of the mA and/or kV according to patient size and/or use of iterative reconstruction technique. COMPARISON:  CT head 07/20/2022.  MRI head 07/23/2022. FINDINGS: Brain: There is no evidence of acute intracranial hemorrhage, mass lesion, brain edema or extra-axial fluid collection. Diffuse atrophy with prominence of the ventricles and subarachnoid spaces. There are chronic small vessel ischemic changes in the periventricular white matter which are unchanged from the recent prior studies. Dense basal ganglia calcifications are present bilaterally. There is no CT evidence of acute cortical infarction. Vascular: Prominent  intracranial vascular calcifications. No hyperdense vessel identified. Skull: Right frontal parietal burr holes are again noted. No acute calvarial findings. Sinuses/Orbits: The visualized paranasal sinuses and mastoid air cells are clear. No orbital abnormalities are seen. Other: None. IMPRESSION: Stable examination without evidence of acute intracranial findings. Stable atrophy and chronic small vessel ischemic changes. Electronically Signed   By: Richardean Sale M.D.   On: 07/26/2022 11:24     LOS: 7 days   Antonieta Pert, MD [facility]  07/28/2022, 9:59 AM

## 2022-07-28 NOTE — Progress Notes (Signed)
Assessment & Plan: POD#8 - s/p lap appendectomy for gangrenous perforated appendicitis  Abd exam benign, wounds dry and intact  IV abx's completed, WBC normal Acute CVA  Neurology following  SLP following - on Dys 1 diet  May move to [facility] for stroke rehab   FEN - dys 1 diet per SLP VTE - SCDs, Lovenox, ASA/Plavix  ID - Zosyn 9/9 - 9/14. None currently    - Per TRH - L lentiform nucleus CVA - per neurology Hypothyroidism  Hypertension        Nicholas Level, MD [name] practice Office: [phone]        Chief Complaint: Acute gangrenous appendicitis  Subjective: Patient in bed, family at bedside.  Responds to voice - language barrier also.  Objective: Vital signs in last 24 hours: Temp:  [97.4 F (36.3 C)-98 F (36.7 C)] 97.4 F (36.3 C) (09/16 0613) Pulse Rate:  [67-87] 67 (09/16 0613) Resp:  [16-18] 16 (09/16 0613) BP: (127-153)/(78-97) 153/78 (09/16 5638) SpO2:  [94 %-100 %] 94 % (09/16 0613) Last BM Date : 07/26/22  Intake/Output from previous day: 09/15 0701 - 09/16 0700 In: 0  Out: 150 [Urine:150] Intake/Output this shift: No intake/output data recorded.  Physical Exam: HEENT - sclerae clear, mucous membranes moist Neck - soft Abdomen - soft without distension; wounds dry and intact; non-tender   Lab Results:  Recent Labs    07/27/22 0810 07/28/22 0721  WBC 10.1 6.9  HGB 15.2 14.7  HCT 44.7 44.9  PLT 289 280   BMET Recent Labs    07/27/22 0810 07/28/22 0721  NA 142 145  K 3.8 3.6  CL 104 106  CO2 29 30  GLUCOSE 158* 152*  BUN 21 31*  CREATININE 0.85 0.94  CALCIUM 9.5 9.5   PT/INR No results for input(s): "LABPROT", "INR" in the last 72 hours. Comprehensive Metabolic Panel:    Component Value Date/Time   NA 145 07/28/2022 0721   NA 142 07/27/2022 0810   K 3.6 07/28/2022 0721   K 3.8 07/27/2022 0810   CL 106 07/28/2022 0721   CL 104 07/27/2022 0810   CO2 30 07/28/2022 0721   CO2 29  07/27/2022 0810   BUN 31 (H) 07/28/2022 0721   BUN 21 07/27/2022 0810   CREATININE 0.94 07/28/2022 0721   CREATININE 0.85 07/27/2022 0810   GLUCOSE 152 (H) 07/28/2022 0721   GLUCOSE 158 (H) 07/27/2022 0810   CALCIUM 9.5 07/28/2022 0721   CALCIUM 9.5 07/27/2022 0810   AST 44 (H) 07/21/2022 1041   AST 26 07/20/2022 0900   ALT 22 07/21/2022 1041   ALT 18 07/20/2022 0900   ALKPHOS 28 (L) 07/21/2022 1041   ALKPHOS 32 (L) 07/20/2022 0900   BILITOT 1.5 (H) 07/21/2022 1041   BILITOT 1.5 (H) 07/20/2022 0900   PROT 7.6 07/21/2022 1041   PROT 7.6 07/20/2022 0900   ALBUMIN 3.8 07/21/2022 1041   ALBUMIN 4.1 07/20/2022 0900    Studies/Results: DG Swallowing Func-Speech Pathology  Result Date: 07/28/2022 Table formatting from the original result was not included. Objective Swallowing Evaluation: Type of Study: MBS-Modified Barium Swallow Study  Patient Details Name: [name] MRN: [id number] Date of Birth: 01-15-1935 Today's Date: 07/28/2022 Time: SLP Start Time (ACUTE ONLY): 0745 -SLP Stop Time (ACUTE ONLY): 0818 SLP Time Calculation (min) (ACUTE ONLY): 33 min Past Medical History: Past Medical History: Diagnosis Date  Chronic kidney disease   left nephrolithiasis  Hematuria   ureteral stone and  stent  Hyperlipidemia   Hypertension   Right ureteral stone   Subdural hematoma (HCC) 02/12/2015 Past Surgical History: Past Surgical History: Procedure Laterality Date  CATARACT EXTRACTION W/ INTRAOCULAR LENS  IMPLANT, BILATERAL    CRANIOTOMY Right 02/12/2015  Procedure: [name] for HEMATOMA EVACUATION SUBDURAL;  Surgeon: Nicholas Lias, MD;  Location: [facility];  Service: Neurosurgery;  Laterality: Right;  CYSTOSCOPY W/ URETERAL STENT PLACEMENT  01-02-11  right  CYSTOSCOPY WITH URETEROSCOPY  09/17/2011  Procedure: CYSTOSCOPY WITH URETEROSCOPY;  Surgeon: Nicholas Cage, MD;  Location: [facility];  Service: Urology;  Laterality: Right;  CYSTOSCOPY, RIGHT URETEROSCOPY WITH LASER LITHO  AND STENT   EXTRACORPOREAL SHOCK WAVE LITHOTRIPSY  02-05-11  right  EXTRACORPOREAL SHOCK WAVE LITHOTRIPSY Right 05/12/2020  Procedure: EXTRACORPOREAL SHOCK WAVE LITHOTRIPSY (ESWL);  Surgeon: Nicholas Martinez, MD;  Location: [facility];  Service: Urology;  Laterality: Right;  LAPAROSCOPIC APPENDECTOMY N/A 07/20/2022  Procedure: APPENDECTOMY LAPAROSCOPIC;  Surgeon: Nicholas Level, MD;  Location: [facility];  Service: General;  Laterality: N/A; HPI: 87 yo male adm to [name] with abdomen pain - found to have acute gangrenous appendicitis.  He is s/p appendectomy and was tolerating soft/thin diet per notes.  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 Swallow evaluation ordered as pt failed [facility] stroke swallow screen.  Pt has h/o having undergone MBS .  Moderate esophageal dysmotility, with a presbyesophagus pattern per prior esophagram.  Subjective: pt awake in chair, pt's brother present *he had spent the night*  Recommendations for follow up therapy are one component of a multi-disciplinary discharge planning process, led by the attending physician.  Recommendations may be updated based on patient status, additional functional criteria and insurance authorization. Assessment / Plan / Recommendation   07/28/2022   8:38 AM Clinical Impressions Clinical Impression Patient's brother was present t/o MBS and assisted with interpretation for pt.  Pt presents with moderately severe oropharyngeal dysphagia with sensorimotor deficits due to his CVA.  He demonstrates weak lingual contraction/control resulting in nearly absent "mastication", prolonged oral transiting and weak propulsion.  Oral retention of secretions mixed with barium observed post-swallow.  Pharyngeal swallow marked by inconsistently impaired tongue base retraction resulting in vallecular retention.  Partial chin tuck posture and attempt at head turn right *difficult for pt to perform - (suspect language barrier contributed)- did  not improve pharyngeal clearance.  Trace aspiration of thin with sequential swallows noted due to impaired prolonged laryngeal closure.  Pt did not sense trace aspiration nor vallecular retention and cued cough/expectoration attempt was produced but was very weak and ineffective to clear.  Very poor "mastication" of cracker noted with gross retention in valleculae elevating pt.'s aspiration risk with solids.  Recommend dys1/extra gravies/sauces and thin with strict precautions.  Oral suction after meals due to oral retention of secretions.  Will follow for skilled dysphagia management.  During testing, SLP educated pt and his brother to findings/recommendations using video monitor for feedback.  Advised him that if he is coughing, suspect highly he is aspirating and that it will be prudent to use caution - taking small single sips. SLP Visit Diagnosis Dysphagia, oropharyngeal phase (R13.12) Impact on safety and function Moderate aspiration risk;Risk for inadequate nutrition/hydration     07/28/2022   8:38 AM Treatment Recommendations Treatment Recommendations Therapy as outlined in treatment plan below     07/28/2022   8:47 AM Prognosis Prognosis for Safe Diet Advancement Good   07/28/2022   8:38 AM Diet  Recommendations SLP Diet Recommendations Dysphagia 1 (Puree) solids;Thin liquid Liquid Administration via Cup;Straw Medication Administration Crushed with puree Compensations Slow rate;Small sips/bites;Other (Comment) Postural Changes Seated upright at 90 degrees;Remain semi-upright after after feeds/meals (Comment)     07/28/2022   8:38 AM Other Recommendations Oral Care Recommendations Oral care BID Follow Up Recommendations Home health SLP Assistance recommended at discharge Frequent or constant Supervision/Assistance Functional Status Assessment Patient has had a recent decline in their functional status and demonstrates the ability to make significant improvements in function in a reasonable and predictable amount  of time.   07/28/2022   8:38 AM Frequency and Duration  Speech Therapy Frequency (ACUTE ONLY) min 1 x/week Treatment Duration 1 week     07/28/2022   8:25 AM Oral Phase Oral Phase Impaired Oral - Nectar Teaspoon Decreased bolus cohesion;Delayed oral transit;Weak lingual manipulation Oral - Nectar Cup Decreased bolus cohesion;Right anterior bolus loss;Delayed oral transit;Weak lingual manipulation Oral - Nectar Straw Decreased bolus cohesion;Right anterior bolus loss;Premature spillage;Delayed oral transit;Weak lingual manipulation Oral - Thin Teaspoon Decreased bolus cohesion;Delayed oral transit;Right anterior bolus loss;Premature spillage;Weak lingual manipulation Oral - Thin Cup Decreased bolus cohesion;Premature spillage;Delayed oral transit;Weak lingual manipulation Oral - Thin Straw Decreased bolus cohesion;Delayed oral transit;Premature spillage Oral - Puree Decreased bolus cohesion;Delayed oral transit;Premature spillage;Weak lingual manipulation;Lingual pumping;Lingual/palatal residue Oral - Mech Soft Decreased bolus cohesion;Impaired mastication;Delayed oral transit;Premature spillage;Weak lingual manipulation Oral Phase - Comment head turn to the right was not performed adequately and did not prevent retention; slight chin tuck posture with nectar furhter compromised ability for pt to propel boluses into phayrnx and did not contribute to decreased vallecular retention    07/28/2022   8:26 AM Pharyngeal Phase Pharyngeal Phase Impaired Pharyngeal- Nectar Teaspoon Pharyngeal residue - valleculae;Reduced tongue base retraction Pharyngeal Material does not enter airway Pharyngeal- Nectar Cup Reduced epiglottic inversion;Pharyngeal residue - valleculae;Reduced tongue base retraction Pharyngeal Material does not enter airway Pharyngeal- Nectar Straw Pharyngeal residue - valleculae;Reduced tongue base retraction Pharyngeal Material does not enter airway Pharyngeal- Thin Teaspoon Reduced tongue base  retraction;Reduced epiglottic inversion;Pharyngeal residue - valleculae Pharyngeal- Thin Cup Reduced airway/laryngeal closure;Reduced laryngeal elevation;Penetration/Apiration after swallow Pharyngeal Material enters airway, passes BELOW cords without attempt by patient to eject out (silent aspiration) Pharyngeal- Mechanical Soft Reduced epiglottic inversion;Reduced tongue base retraction;Pharyngeal residue - valleculae Pharyngeal Comment head turn to the right was not performed adequately and did not prevent retention; slight chin tuck posture with nectar furhter compromised ability for pt to propel boluses into phayrnx and did not contribute to decreased vallecular retention; significantly weak tongue base retraction/pharyngeal swallow with cracker bolus allowed gross retention with initial swallow    07/28/2022   8:37 AM Cervical Esophageal Phase  Cervical Esophageal Phase Impaired Cervical Esophageal Comment Pt has known h/o esophageal dysmotility per prior esophagram; Upon esophageal sweep, pt appeared with minimal barium retention that mixed with secretions Kathleen Lime, MS [facility] SLP Acute Rehab Services Office [phone] Pager [phone] Macario Golds 07/28/2022, 8:53 AM                     CT ANGIO HEAD NECK W WO CM  Result Date: 07/27/2022 CLINICAL DATA:  Acute neurologic deficit EXAM: CT ANGIOGRAPHY HEAD AND NECK TECHNIQUE: Multidetector CT imaging of the head and neck was performed using the standard protocol during bolus administration of intravenous contrast. Multiplanar CT image reconstructions and MIPs were obtained to evaluate the vascular anatomy. Carotid stenosis measurements (when applicable) are obtained utilizing NASCET criteria, using the distal internal carotid  diameter as the denominator. RADIATION DOSE REDUCTION: This exam was performed according to the departmental dose-optimization program which includes automated exposure control, adjustment of the mA and/or kV according to patient  size and/or use of iterative reconstruction technique. CONTRAST:  75mL OMNIPAQUE IOHEXOL 350 MG/ML SOLN COMPARISON:  None Available. FINDINGS: CT HEAD FINDINGS Brain: There is no mass, hemorrhage or extra-axial collection. There is generalized atrophy without lobar predilection. There is hypoattenuation of the periventricular white matter, most commonly indicating chronic ischemic microangiopathy. Skull: The visualized skull base, calvarium and extracranial soft tissues are normal. Sinuses/Orbits: No fluid levels or advanced mucosal thickening of the visualized paranasal sinuses. No mastoid or middle ear effusion. The orbits are normal. CTA NECK FINDINGS SKELETON: There is no bony spinal canal stenosis. No lytic or blastic lesion. OTHER NECK: Normal pharynx, larynx and major salivary glands. No cervical lymphadenopathy. Unremarkable thyroid gland. UPPER CHEST: No pneumothorax or pleural effusion. No nodules or masses. AORTIC ARCH: There is calcific atherosclerosis of the aortic arch. There is no aneurysm, dissection or hemodynamically significant stenosis of the visualized portion of the aorta. Conventional 3 vessel aortic branching pattern. The visualized proximal subclavian arteries are widely patent. RIGHT CAROTID SYSTEM: No dissection, occlusion or aneurysm. Mild atherosclerotic calcification at the carotid bifurcation without hemodynamically significant stenosis. LEFT CAROTID SYSTEM: No dissection, occlusion or aneurysm. Mild atherosclerotic calcification at the carotid bifurcation without hemodynamically significant stenosis. VERTEBRAL ARTERIES: Left dominant configuration. Both origins are clearly patent. There is no dissection, occlusion or flow-limiting stenosis to the skull base (V1-V3 segments). CTA HEAD FINDINGS POSTERIOR CIRCULATION: --Vertebral arteries: Normal V4 segments. --Inferior cerebellar arteries: Normal. --Basilar artery: Normal. --Superior cerebellar arteries: Normal. --Posterior cerebral  arteries (PCA): Normal. ANTERIOR CIRCULATION: --Intracranial internal carotid arteries: Atherosclerotic calcification of the internal carotid arteries at the skull base without hemodynamically significant stenosis. --Anterior cerebral arteries (ACA): Normal. Both A1 segments are present. Patent anterior communicating artery (a-comm). --Middle cerebral arteries (MCA): Normal. VENOUS SINUSES: As permitted by contrast timing, patent. ANATOMIC VARIANTS: None Review of the MIP images confirms the above findings. IMPRESSION: 1. No emergent large vessel occlusion or hemodynamically significant stenosis of the head or neck. 2. Aortic Atherosclerosis (ICD10-I70.0). Electronically Signed   By: Deatra RobinsonKevin  Herman M.D.   On: 07/27/2022 00:33   MR BRAIN WO CONTRAST  Result Date: 07/26/2022 CLINICAL DATA:  Altered mental status.  Evaluate for stroke. EXAM: MRI HEAD WITHOUT CONTRAST TECHNIQUE: Multiplanar, multiecho pulse sequences of the brain and surrounding structures were obtained without intravenous contrast. COMPARISON:  Brain MRI 3 days prior and same-day head CT FINDINGS: Brain: There are numerous small acute infarcts in the left lentiform nucleus which are new since the study from 3 days prior without hemorrhage or mass effect. There is no other evidence of acute infarct. There is no acute intracranial hemorrhage or extra-axial fluid collection. Background parenchymal volume loss with prominence of the ventricular system and extra-axial CSF spaces is unchanged the ventricles are stable in size. Confluent FLAIR signal abnormality in the supratentorial brain likely reflecting chronic small vessel ischemic change is stable. A remote infarct in the right external capsule and scattered punctate chronic microhemorrhages are stable. There is no mass lesion.  There is no mass effect or midline shift. Vascular: Normal flow voids. Skull and upper cervical spine: Normal marrow signal. Sinuses/Orbits: The paranasal sinuses are clear.  Bilateral lens implants are in place. The globes and orbits are otherwise unremarkable. Other: None. IMPRESSION: Small acute infarcts in the left lentiform nucleus without hemorrhage or mass effect  are new since 07/23/2022. Electronically Signed   By: Lesia Hausen M.D.   On: 07/26/2022 16:37   CT HEAD WO CONTRAST ( )  Result Date: 07/26/2022 CLINICAL DATA:  Mental status change, unknown cause. EXAM: CT HEAD WITHOUT CONTRAST TECHNIQUE: Contiguous axial images were obtained from the base of the skull through the vertex without intravenous contrast. RADIATION DOSE REDUCTION: This exam was performed according to the departmental dose-optimization program which includes automated exposure control, adjustment of the mA and/or kV according to patient size and/or use of iterative reconstruction technique. COMPARISON:  CT head 07/20/2022.  MRI head 07/23/2022. FINDINGS: Brain: There is no evidence of acute intracranial hemorrhage, mass lesion, brain edema or extra-axial fluid collection. Diffuse atrophy with prominence of the ventricles and subarachnoid spaces. There are chronic small vessel ischemic changes in the periventricular white matter which are unchanged from the recent prior studies. Dense basal ganglia calcifications are present bilaterally. There is no CT evidence of acute cortical infarction. Vascular: Prominent intracranial vascular calcifications. No hyperdense vessel identified. Skull: Right frontal parietal burr holes are again noted. No acute calvarial findings. Sinuses/Orbits: The visualized paranasal sinuses and mastoid air cells are clear. No orbital abnormalities are seen. Other: None. IMPRESSION: Stable examination without evidence of acute intracranial findings. Stable atrophy and chronic small vessel ischemic changes. Electronically Signed   By: Carey Bullocks M.D.   On: 07/26/2022 11:24      [name] 07/28/2022   Patient ID: [name], male   DOB: [birth year], 87 y.o.   MRN:  [id number]
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
## 2022-07-29 DIAGNOSIS — K35891 Other acute appendicitis without perforation, with gangrene: Secondary | ICD-10-CM | POA: Diagnosis not present

## 2022-07-29 MED ORDER — ASPIRIN 81 MG PO CHEW
81.0000 mg | CHEWABLE_TABLET | Freq: Every day | ORAL | Status: DC
  Administered 2022-07-29 – 2022-07-31 (×3): 81 mg via ORAL
  Filled 2022-07-29 (×3): qty 1

## 2022-07-29 NOTE — Evaluation (Signed)
Re-Occupational Therapy Evaluation Patient Details Name: [name] MRN: [id number] DOB: 1935/08/19 Today's Date: 07/29/2022   History of Present Illness Patient is a 87 year old male admitted with acute appendicitis s/p appendectomy 9/8, acute respiratory failure, sepsis. patient was noted to have developed trouble using the right upper extremity while the son at bedside on 9/14 and MRI was positive for CVA at that time Hx of SDH s/p evacuation 2016, hypothyroidism, falls, ataxia.   Clinical Impression   Patient is a 87 year old male who was admitted for above. Patient was noted to have "Small acute infarcts in the left lentiform nucleus without hemorrhage or mass effect are new since 07/23/2022" per MRI of brain on 9/14. Patient was noted to have flaccid RUE and RLE with difficulty assessing for inattention to R side with language barrier. Patient appeared to have increased difficulty with engaging with the IPAD and son in room attempted to repeat ipad to patient. Patient would need +2 physical assistance in next level of care to be successful. Patient was noted to have decreased functional activity tolernace, decreased ROM, decreased BUE strength, decreased endurance, decreased sitting balance, decreased standing balanced, decreased safety awareness, and decreased knowledge of AE/AD impacting participation in ADLs. Patient would continue to benefit from skilled OT services at this time while admitted and after d/c to address noted deficits in order to improve overall safety and independence in ADLs.     Recommendations for follow up therapy are one component of a multi-disciplinary discharge planning process, led by the attending physician.  Recommendations may be updated based on patient status, additional functional criteria and insurance authorization.   Follow Up Recommendations  Skilled nursing-short term rehab (<3 hours/day)    Assistance Recommended at Discharge Frequent or constant  Supervision/Assistance  Patient can return home with the following A lot of help with bathing/dressing/bathroom;Direct supervision/assist for financial management;Direct supervision/assist for medications management;Two people to help with walking and/or transfers    Functional Status Assessment  Patient has had a recent decline in their functional status and demonstrates the ability to make significant improvements in function in a reasonable and predictable amount of time.  Equipment Recommendations  BSC/3in1    Recommendations for Other Services       Precautions / Restrictions Precautions Precautions: Fall Precaution Comments: monitor O2 Restrictions Weight Bearing Restrictions: No Other Position/Activity Restrictions: hemi R side      Mobility Bed Mobility Overal bed mobility: Needs Assistance     Sidelying to sit: Max assist   Sit to supine: Max assist   General bed mobility comments: patient needed TD for movement of RLE and RUE to safely navigate to EOB with increased time. patients son present attempting to assist with translation when patient was not responding to the ipad    Transfers                          Balance Overall balance assessment: Needs assistance Sitting-balance support: Feet supported, Single extremity supported Sitting balance-Leahy Scale: Poor   Postural control: Right lateral lean                                 ADL either performed or assessed with clinical judgement   ADL Overall ADL's : Needs assistance/impaired Eating/Feeding: Maximal assistance;Bed level   Grooming: Bed level;Maximal assistance   Upper Body Bathing: Bed level;Maximal assistance   Lower Body Bathing: Bed  level;Total assistance   Upper Body Dressing : Bed level;Maximal assistance   Lower Body Dressing: Bed level;Total assistance     Toilet Transfer Details (indicate cue type and reason): patient was unable to hold self upright with R  lateral lean sitting EOB with increased time and mod A at best to maintain sitting balance EOB. patient noted to be quick to fatigue. no +2 avaialbe standing deferred at this time.                 Vision   Additional Comments: difficult to assess with language barriers and translator usage     Perception     Praxis      Pertinent Vitals/Pain Pain Assessment Pain Assessment: No/denies pain     Hand Dominance     Extremity/Trunk Assessment Upper Extremity Assessment Upper Extremity Assessment: RUE deficits/detail RUE Deficits / Details: patient has no functional movement of RUE. flaccid with education provided on proper positioning on this date to patient and son   Lower Extremity Assessment Lower Extremity Assessment: Defer to PT evaluation       Communication     Cognition Arousal/Alertness: Awake/alert Behavior During Therapy: WFL for tasks assessed/performed Overall Cognitive Status: Difficult to assess                                 General Comments: son present and ipad interpreter used to communicate with son and patient     General Comments       Exercises     Shoulder Instructions      Home Living                                          Prior Functioning/Environment                          OT Problem List: Decreased strength;Impaired balance (sitting and/or standing);Decreased activity tolerance;Decreased knowledge of use of DME or AE;Decreased safety awareness;Decreased cognition;Cardiopulmonary status limiting activity;Pain;Increased edema;Impaired UE functional use;Decreased range of motion;Decreased coordination      OT Treatment/Interventions: Self-care/ADL training;DME and/or AE instruction;Therapeutic activities;Patient/family education;Balance training;Therapeutic exercise;Neuromuscular education;Energy conservation    OT Goals(Current goals can be found in the care plan section) Acute Rehab OT  Goals Patient Stated Goal: to get better OT Goal Formulation: With family Time For Goal Achievement: 08/12/22 Potential to Achieve Goals: Fair  OT Frequency: Min 2X/week    Co-evaluation              AM-PAC OT "6 Clicks" Daily Activity     Outcome Measure Help from another person eating meals?: A Lot Help from another person taking care of personal grooming?: A Lot Help from another person toileting, which includes using toliet, bedpan, or urinal?: Total Help from another person bathing (including washing, rinsing, drying)?: Total Help from another person to put on and taking off regular upper body clothing?: A Lot Help from another person to put on and taking off regular lower body clothing?: Total 6 Click Score: 9   End of Session Nurse Communication: Mobility status  Activity Tolerance: Patient limited by fatigue Patient left: in bed;with call bell/phone within reach;with bed alarm set;with family/visitor present  OT Visit Diagnosis: Muscle weakness (generalized) (M62.81);Pain  Time: 5750-5183 OT Time Calculation (min): 17 min Charges:  OT General Charges $OT Visit: 1 Visit OT Evaluation $OT Re-eval: 1 Re-eval  Rennie Plowman, MS Acute Rehabilitation Department Office# [phone]   Marcellina Millin 07/29/2022, 4:41 PM

## 2022-07-29 NOTE — Progress Notes (Signed)
PROGRESS NOTE [name]  QAS:[id number] DOB: 08-May-1935 DOA: 07/20/2022 PCP: Deatra James, MD   Brief Narrative/Hospital Course: 87 y.o. male with past medical history of hypertension, hypothyroidism, hyperlipidemia, nephrolithiasis, and SDH s/p evacuation in 2016, now presenting to the emergency department with lethargy, confusion, and fall. Initially, patient was admitted by surgery and taken to the OR  on 9/8 for laparoscopic appendectomy where he was found to have acute gangrenous perforated appendicitis.  He emerged from anesthesia hypertensive, tachycardic, wheezing, and hypoxic.  He had a chest x-ray concerning for developing pulmonary edema, was given 40 mg IV Lasix, Xopenex, and transferred to the ICU. Patient continued to make progress and he was transferred to the floor and to [facility] service.  Patient continued to be weak and deconditioned, MRI of the brain was done on 07/23/22 to evaluate for stroke, which was negative for acute intra cranial abnormalities.  He again developed trouble using the right upper extremity while the son at bedside on 9/14 and MRI was positive for CVA at that time.  He is waiting for transfer to [facility] for neurology evaluatioN    Subjective: Seen and examined this morning.  Grandson at the bedside. No new complaints. He is not eating much.  Assessment and Plan: Principal Problem:   Acute gangrenous appendicitis s/p lap appendectomy 07/20/2022 Active Problems:   Ataxia   Hypertension   Hyperlipidemia   Hypothyroidism   Acute encephalopathy   Severe sepsis (HCC)   Acute respiratory failure with hypoxia (HCC)   Acute pulmonary edema (HCC)   Non-English speaking patient   History of subdural hematoma   History of fall   Acute appendicitis   Acute CVA with right sided weakness, dysphagia dysarthria: MRI 9/14-new small acute infarcts L lentiform nucleus,neurology was consulted-Echo EF 65 to 70%, no acute finding. .Hba1c 6.6, LDL 125 not at goal.  CT  angio head and neck no emergent LVO or significant stenosis of head or neck. SLP speech eval completed able to take dysphagia 1 diet.  Discussed with Dr. Selina Cooley no need to transfer at this time since patient has completed stroke work-up.  Plan is to continue aspirin 81+ Plavix 75 mg x 21 days after which Plavix monotherapy alone and continue high intensity statin.  Neuro will arrange outpatient follow-up.Continue intensive PT OT .   Acute respiratory failure with hypoxia: Needing up to 10 L [place] initially resolved with Lasix.   Acute metabolic encephalopathy w/ fatigue less verbal x1 week and also delirium and lethargy: Work-up with normal TSH B12 low normal no UTI. RPR is Negative. Initial CT unremarkable.  At this time mental status stable.    Hypertension: BP is well controlled W/O meds. Cont prn Hypothyroidism; TSH is stable continue Synthroid IV  Perforated gangrenous appendicitis Sepsis  2/2 above: S/p appendectomy.Completed 7 days of antibiotics.  Seen by surgery-tolerating diet no abdominal pain.   Bilateral foot pain chronic for many years: likely neuropathy.Would wait until he is back to baseline before starting him on Neurontin.    Staphylococcus species in 1 out of 4 blood culture bottles Likely a contaminant.  S/p antibiotics and blood cultures repeat was negative   Poor oral intake: Augment nutritional status family bringing food  DVT prophylaxis: enoxaparin (LOVENOX) injection 40 mg Start: 07/23/22 1000 SCDs Start: 07/23/22 0853 SCD's Start: 07/20/22 1939 Code Status:   Code Status: Full Code Family Communication: plan of care discussed with patient/son at bedside. Patient status is: Inpatient because of stroke work-up Level of care: Telemetry  Medical   Dispo: The patient is from: HOME            Anticipated disposition:Family would like to go to skilled nursing facility, PT OT reeval requested.  Objective: Vitals last 24 hrs: Vitals:   07/28/22 2303 07/29/22 0106  07/29/22 0514 07/29/22 0515  BP: (!) 132/91 116/86 (!) 133/92   Pulse: 99 84 79 81  Resp: (!) 24 (!) 24 (!) 23   Temp: 97.6 F (36.4 C) (!) 97.4 F (36.3 C) 97.7 F (36.5 C)   TempSrc: Oral Oral Oral   SpO2: 96% 95%  93%  Weight:      Height:       Weight change:   Physical Examination: General exam: AA, older than stated age, weak appearing. HEENT:Oral mucosa moist, Ear/Nose WNL grossly, dentition normal. Respiratory system: bilaterally diminished, no use of accessory muscle Cardiovascular system: S1 & S2 +, No JVD,. Gastrointestinal system: Abdomen soft,NT,ND,BS+ Nervous System:Alert, awake, unable to move right upper and lower extremities  Extremities: LE ankle edema neg, distal peripheral pulses palpable.  Skin: No rashes,no icterus. MSK: Normal muscle bulk,tone, power   Medications reviewed:  Scheduled Meds:  acetaminophen  1,000 mg Oral TID   aspirin  300 mg Rectal Daily   atorvastatin  80 mg Oral Daily   clopidogrel  75 mg Oral Daily   enoxaparin (LOVENOX) injection  40 mg Subcutaneous Q24H   lactose free nutrition  237 mL Oral TID WC   levothyroxine  50 mcg Oral Q0600   lip balm   Topical BID   mouth rinse  15 mL Mouth Rinse 4 times per day   polycarbophil  625 mg Oral BID   Continuous Infusions:  sodium chloride Stopped (07/21/22 1432)   methocarbamol (ROBAXIN) IV     ondansetron (ZOFRAN) IV      Diet Order             DIET - DYS 1 Room service appropriate? Yes; Fluid consistency: Thin  Diet effective now                 Nutrition Problem: Increased nutrient needs Etiology: post-op healing Signs/Symptoms: estimated needs Interventions: Boost Plus   Intake/Output Summary (Last 24 hours) at 07/29/2022 0723 Last data filed at 07/29/2022 0600 Gross per 24 hour  Intake 480 ml  Output 700 ml  Net -220 ml    Net IO Since Admission: -6,495.9 mL [07/29/22 0723]  Wt Readings from Last 3 Encounters:  07/20/22 73.3 kg  05/12/20 70.4 kg  02/14/15 70.9  kg     Unresulted Labs (From admission, onward)    None     Data Reviewed: I have personally reviewed following labs and imaging studies CBC: Recent Labs  Lab 07/24/22 0433 07/27/22 0810 07/28/22 0721  WBC 7.8 10.1 6.9  NEUTROABS 4.2  --   --   HGB 14.0 15.2 14.7  HCT 42.3 44.7 44.9  MCV 97.0 96.3 98.0  PLT 208 289 076    Basic Metabolic Panel: Recent Labs  Lab 07/23/22 0424 07/24/22 0433 07/27/22 0810 07/28/22 0721  NA 139 140 142 145  K 4.2 4.5 3.8 3.6  CL 103 101 104 106  CO2 29 32 29 30  GLUCOSE 138* 150* 158* 152*  BUN 21 16 21  31*  CREATININE 0.86 0.74 0.85 0.94  CALCIUM 8.6* 8.8* 9.5 9.5    GFR: Estimated Creatinine Clearance: 51.6 mL/min (by C-G formula based on SCr of 0.94 mg/dL). Liver Function Tests: No results for  input(s): "AST", "ALT", "ALKPHOS", "BILITOT", "PROT", "ALBUMIN" in the last 168 hours.  HbA1C: Recent Labs    07/27/22 0810  HGBA1C 6.6*    CBG: No results for input(s): "GLUCAP" in the last 168 hours. Lipid Profile: Recent Labs    07/27/22 0810  CHOL 192  HDL 27*  LDLCALC 125*  TRIG 198*  CHOLHDL 7.1   Sepsis Labs: Recent Labs  Lab 07/24/22 0433  LATICACIDVEN 1.2    Radiology Studies: DG Swallowing Func-Speech Pathology  Result Date: 07/28/2022 Table formatting from the original result was not included. Objective Swallowing Evaluation: Type of Study: MBS-Modified Barium Swallow Study  Patient Details Name: Teller Wakefield MRN: [id number] Date of Birth: 16-Dec-1934 Today's Date: 07/28/2022 Time: SLP Start Time (ACUTE ONLY): 0745 -SLP Stop Time (ACUTE ONLY): 0818 SLP Time Calculation (min) (ACUTE ONLY): 33 min Past Medical History: Past Medical History: Diagnosis Date  Chronic kidney disease   left nephrolithiasis  Hematuria   ureteral stone and stent  Hyperlipidemia   Hypertension   Right ureteral stone   Subdural hematoma (HCC) 02/12/2015 Past Surgical History: Past Surgical History: Procedure Laterality Date  CATARACT  EXTRACTION W/ INTRAOCULAR LENS  IMPLANT, BILATERAL    CRANIOTOMY Right 02/12/2015  Procedure: [name] for HEMATOMA EVACUATION SUBDURAL;  Surgeon: Hilda Lias, MD;  Location: [facility];  Service: Neurosurgery;  Laterality: Right;  CYSTOSCOPY W/ URETERAL STENT PLACEMENT  01-02-11  right  CYSTOSCOPY WITH URETEROSCOPY  09/17/2011  Procedure: CYSTOSCOPY WITH URETEROSCOPY;  Surgeon: Milford Cage, MD;  [facility];  Service: Urology;  Laterality: Right;  CYSTOSCOPY, RIGHT URETEROSCOPY WITH LASER LITHO AND STENT   EXTRACORPOREAL SHOCK WAVE LITHOTRIPSY  02-05-11  right  EXTRACORPOREAL SHOCK WAVE LITHOTRIPSY Right 05/12/2020  Procedure: EXTRACORPOREAL SHOCK WAVE LITHOTRIPSY (ESWL);  Surgeon: Alfredo Martinez, MD;  Location: [facility];  Service: Urology;  Laterality: Right;  LAPAROSCOPIC APPENDECTOMY N/A 07/20/2022  Procedure: APPENDECTOMY LAPAROSCOPIC;  Surgeon: Darnell Level, MD;  Location: [facility];  Service: General;  Laterality: N/A; HPI: 87 yo male adm to [facility] with abdomen pain - found to have acute gangrenous appendicitis.  He is s/p appendectomy and was tolerating soft/thin diet per notes.  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 Swallow evaluation ordered as pt failed [facility] stroke swallow screen.  Pt has h/o having undergone MBS .  Moderate esophageal dysmotility, with a presbyesophagus pattern per prior esophagram.  Subjective: pt awake in chair, pt's brother present *he had spent the night*  Recommendations for follow up therapy are one component of a multi-disciplinary discharge planning process, led by the attending physician.  Recommendations may be updated based on patient status, additional functional criteria and insurance authorization. Assessment / Plan / Recommendation   07/28/2022   8:38 AM Clinical Impressions Clinical Impression Patient's brother was present t/o MBS and assisted with interpretation for pt.  Pt presents with  moderately severe oropharyngeal dysphagia with sensorimotor deficits due to his CVA.  He demonstrates weak lingual contraction/control resulting in nearly absent "mastication", prolonged oral transiting and weak propulsion.  Oral retention of secretions mixed with barium observed post-swallow.  Pharyngeal swallow marked by inconsistently impaired tongue base retraction resulting in vallecular retention.  Partial chin tuck posture and attempt at head turn right *difficult for pt to perform - (suspect language barrier contributed)- did not improve pharyngeal clearance.  Trace aspiration of thin with sequential swallows noted due to impaired prolonged laryngeal closure.  Pt did not sense trace aspiration nor  vallecular retention and cued cough/expectoration attempt was produced but was very weak and ineffective to clear.  Very poor "mastication" of cracker noted with gross retention in valleculae elevating pt.'s aspiration risk with solids.  Recommend dys1/extra gravies/sauces and thin with strict precautions.  Oral suction after meals due to oral retention of secretions.  Will follow for skilled dysphagia management.  During testing, SLP educated pt and his brother to findings/recommendations using video monitor for feedback.  Advised him that if he is coughing, suspect highly he is aspirating and that it will be prudent to use caution - taking small single sips. SLP Visit Diagnosis Dysphagia, oropharyngeal phase (R13.12) Impact on safety and function Moderate aspiration risk;Risk for inadequate nutrition/hydration     07/28/2022   8:38 AM Treatment Recommendations Treatment Recommendations Therapy as outlined in treatment plan below     07/28/2022   8:47 AM Prognosis Prognosis for Safe Diet Advancement Good   07/28/2022   8:38 AM Diet Recommendations SLP Diet Recommendations Dysphagia 1 (Puree) solids;Thin liquid Liquid Administration via Cup;Straw Medication Administration Crushed with puree Compensations Slow  rate;Small sips/bites;Other (Comment) Postural Changes Seated upright at 90 degrees;Remain semi-upright after after feeds/meals (Comment)     07/28/2022   8:38 AM Other Recommendations Oral Care Recommendations Oral care BID Follow Up Recommendations Home health SLP Assistance recommended at discharge Frequent or constant Supervision/Assistance Functional Status Assessment Patient has had a recent decline in their functional status and demonstrates the ability to make significant improvements in function in a reasonable and predictable amount of time.   07/28/2022   8:38 AM Frequency and Duration  Speech Therapy Frequency (ACUTE ONLY) min 1 x/week Treatment Duration 1 week     07/28/2022   8:25 AM Oral Phase Oral Phase Impaired Oral - Nectar Teaspoon Decreased bolus cohesion;Delayed oral transit;Weak lingual manipulation Oral - Nectar Cup Decreased bolus cohesion;Right anterior bolus loss;Delayed oral transit;Weak lingual manipulation Oral - Nectar Straw Decreased bolus cohesion;Right anterior bolus loss;Premature spillage;Delayed oral transit;Weak lingual manipulation Oral - Thin Teaspoon Decreased bolus cohesion;Delayed oral transit;Right anterior bolus loss;Premature spillage;Weak lingual manipulation Oral - Thin Cup Decreased bolus cohesion;Premature spillage;Delayed oral transit;Weak lingual manipulation Oral - Thin Straw Decreased bolus cohesion;Delayed oral transit;Premature spillage Oral - Puree Decreased bolus cohesion;Delayed oral transit;Premature spillage;Weak lingual manipulation;Lingual pumping;Lingual/palatal residue Oral - Mech Soft Decreased bolus cohesion;Impaired mastication;Delayed oral transit;Premature spillage;Weak lingual manipulation Oral Phase - Comment head turn to the right was not performed adequately and did not prevent retention; slight chin tuck posture with nectar furhter compromised ability for pt to propel boluses into phayrnx and did not contribute to decreased vallecular retention     07/28/2022   8:26 AM Pharyngeal Phase Pharyngeal Phase Impaired Pharyngeal- Nectar Teaspoon Pharyngeal residue - valleculae;Reduced tongue base retraction Pharyngeal Material does not enter airway Pharyngeal- Nectar Cup Reduced epiglottic inversion;Pharyngeal residue - valleculae;Reduced tongue base retraction Pharyngeal Material does not enter airway Pharyngeal- Nectar Straw Pharyngeal residue - valleculae;Reduced tongue base retraction Pharyngeal Material does not enter airway Pharyngeal- Thin Teaspoon Reduced tongue base retraction;Reduced epiglottic inversion;Pharyngeal residue - valleculae Pharyngeal- Thin Cup Reduced airway/laryngeal closure;Reduced laryngeal elevation;Penetration/Apiration after swallow Pharyngeal Material enters airway, passes BELOW cords without attempt by patient to eject out (silent aspiration) Pharyngeal- Mechanical Soft Reduced epiglottic inversion;Reduced tongue base retraction;Pharyngeal residue - valleculae Pharyngeal Comment head turn to the right was not performed adequately and did not prevent retention; slight chin tuck posture with nectar furhter compromised ability for pt to propel boluses into phayrnx and did not contribute to decreased vallecular retention;  significantly weak tongue base retraction/pharyngeal swallow with cracker bolus allowed gross retention with initial swallow    07/28/2022   8:37 AM Cervical Esophageal Phase  Cervical Esophageal Phase Impaired Cervical Esophageal Comment Pt has known h/o esophageal dysmotility per prior esophagram; Upon esophageal sweep, pt appeared with minimal barium retention that mixed with secretions Rolena Infante, MS [facility] SLP Acute Rehab Services Office [phone] Pager [phone] Chales Abrahams 07/28/2022, 8:53 AM                       LOS: 8 days   Lanae Boast, MD [facility]  07/29/2022, 7:23 AM

## 2022-07-29 NOTE — Plan of Care (Signed)
Neurology plan of care  This is a 87 yo with hx HTN, hypothyroidism, HL, nephrolithiasis, remote SDH s/p evacuation in 2016 who presented to ED with lethargy, confusion, and fall. He had an acute abdomen on arrival and was admitted by surgery and taken to OR for lap appendectomy where he was found to have acute gangrenous perforated appendicitis. Post-op course was complicated by pulmonary edema which was successfully treated and he was transferred to [facility] service. He developed RUE weakness and subsequent brain MRI showed new small acute infarcts L lentiform nucleus (personal review). Transfer was initiated to [facility] for stroke workup but he is still awaiting bed and stroke workup has already been completed. Results as follows:  TTE 07/21/22 showed normal LVEF and no intracardiac clot  CTA H&N - no LVO or sig stenosis  Stroke Labs     Component Value Date/Time   CHOL 192 07/27/2022 0810   TRIG 198 (H) 07/27/2022 0810   HDL 27 (L) 07/27/2022 0810   CHOLHDL 7.1 07/27/2022 0810   VLDL 40 07/27/2022 0810   LDLCALC 125 (H) 07/27/2022 0810    Lab Results  Component Value Date/Time   HGBA1C 6.6 (H) 07/27/2022 08:10 AM     Neurology recommendations: - OK to cancel transfer, stroke workup has been completed - Goal normotension, avoid hypotension - Continue aspirin 81mg  daily + plavix 75mg  daily x21 days from stroke f/b plavix 75mg  daily monotherapy after that - Continue atorvastatin 80mg  daily - Tele - PT/OT/SLP - Stroke education - I will arrange outpatient neurology f/u   Su Monks, MD [facility] [phone]   If 7pm- 7am, please page neurology on call as listed in [place].

## 2022-07-29 NOTE — Progress Notes (Signed)
Assessment & Plan: POD#9 - s/p lap appendectomy for gangrenous perforated appendicitis             Abd exam benign, wounds dry and intact             IV abx's completed, WBC normal Acute CVA             Neurology following             SLP following - on Dys 1 diet   FEN - dys 1 diet per SLP VTE - SCDs, Lovenox, ASA/Plavix  ID - Zosyn 9/9 - 9/14. None currently    - Per TR[facility] - L lentiform nucleus CVA - per neurology Hypothyroidism  Hypertension  Apparently neuro workup is complete and no need for [facility].  Anticipating discharge home today or tomorrow.  Will arrange follow up at [facility] office in 2 weeks.  Surgery will sign off for now.  Call if issues arise.        Nicholas Levelodd [name], MD [facility] practice Office: [phone]        Chief Complaint: appendicitis  Subjective: Patient in bed, family at bedside.  Comfortable.  Objective: Vital signs in last 24 hours: Temp:  [97.4 F (36.3 C)-98.8 F (37.1 C)] 97.7 F (36.5 C) (09/17 0514) Pulse Rate:  [79-113] 81 (09/17 0515) Resp:  [22-24] 23 (09/17 0514) BP: (114-133)/(86-95) 133/92 (09/17 0514) SpO2:  [93 %-96 %] 93 % (09/17 0515) Last BM Date : 07/26/22  Intake/Output from previous day: 09/16 0701 - 09/17 0700 In: 480 [P.O.:480] Out: 700 [Urine:700] Intake/Output this shift: No intake/output data recorded.  Physical Exam: HEENT - sclerae clear, mucous membranes moist Neck - soft Abdomen - soft without distension; no mass; no guarding; no tenderness; wounds dry and intact Ext - no edema, non-tender  Lab Results:  Recent Labs    07/27/22 0810 07/28/22 0721  WBC 10.1 6.9  HGB 15.2 14.7  HCT 44.7 44.9  PLT 289 280   BMET Recent Labs    07/27/22 0810 07/28/22 0721  NA 142 145  K 3.8 3.6  CL 104 106  CO2 29 30  GLUCOSE 158* 152*  BUN 21 31*  CREATININE 0.85 0.94  CALCIUM 9.5 9.5   PT/INR No results for input(s): "LABPROT", "INR" in the last 72  hours. Comprehensive Metabolic Panel:    Component Value Date/Time   NA 145 07/28/2022 0721   NA 142 07/27/2022 0810   K 3.6 07/28/2022 0721   K 3.8 07/27/2022 0810   CL 106 07/28/2022 0721   CL 104 07/27/2022 0810   CO2 30 07/28/2022 0721   CO2 29 07/27/2022 0810   BUN 31 (H) 07/28/2022 0721   BUN 21 07/27/2022 0810   CREATININE 0.94 07/28/2022 0721   CREATININE 0.85 07/27/2022 0810   GLUCOSE 152 (H) 07/28/2022 0721   GLUCOSE 158 (H) 07/27/2022 0810   CALCIUM 9.5 07/28/2022 0721   CALCIUM 9.5 07/27/2022 0810   AST 44 (H) 07/21/2022 1041   AST 26 07/20/2022 0900   ALT 22 07/21/2022 1041   ALT 18 07/20/2022 0900   ALKPHOS 28 (L) 07/21/2022 1041   ALKPHOS 32 (L) 07/20/2022 0900   BILITOT 1.5 (H) 07/21/2022 1041   BILITOT 1.5 (H) 07/20/2022 0900   PROT 7.6 07/21/2022 1041   PROT 7.6 07/20/2022 0900   ALBUMIN 3.8 07/21/2022 1041   ALBUMIN 4.1 07/20/2022 0900    Studies/Results: DG Swallowing Func-Speech Pathology  Result Date: 07/28/2022 Table formatting from the original result was not included. Objective Swallowing Evaluation: Type of Study: MBS-Modified Barium Swallow Study  Patient Details Name: [name] MRN: [id number] Date of Birth: 1935/07/21 Today's Date: 07/28/2022 Time: SLP Start Time (ACUTE ONLY): 0745 -SLP Stop Time (ACUTE ONLY): 0818 SLP Time Calculation (min) (ACUTE ONLY): 33 min Past Medical History: Past Medical History: Diagnosis Date  Chronic kidney disease   left nephrolithiasis  Hematuria   ureteral stone and stent  Hyperlipidemia   Hypertension   Right ureteral stone   Subdural hematoma ([place]) 02/12/2015 Past Surgical History: Past Surgical History: Procedure Laterality Date  CATARACT EXTRACTION W/ INTRAOCULAR LENS  IMPLANT, BILATERAL    CRANIOTOMY Right 02/12/2015  Procedure: Haskell Flirt for HEMATOMA EVACUATION SUBDURAL;  Surgeon: Leeroy Cha, MD;  Location: [facility];  Service: Neurosurgery;  Laterality: Right;  CYSTOSCOPY W/ URETERAL STENT PLACEMENT   01-02-11  right  CYSTOSCOPY WITH URETEROSCOPY  09/17/2011  Procedure: CYSTOSCOPY WITH URETEROSCOPY;  Surgeon: Molli Hazard, MD;  Location[facility];  Service: Urology;  Laterality: Right;  CYSTOSCOPY, RIGHT URETEROSCOPY WITH LASER LITHO AND STENT   EXTRACORPOREAL SHOCK WAVE LITHOTRIPSY  02-05-11  right  EXTRACORPOREAL SHOCK WAVE LITHOTRIPSY Right 05/12/2020  Procedure: EXTRACORPOREAL SHOCK WAVE LITHOTRIPSY (ESWL);  Surgeon: Bjorn Loser, MD;  Location: [facility];  Service: Urology;  Laterality: Right;  LAPAROSCOPIC APPENDECTOMY N/A 07/20/2022  Procedure: APPENDECTOMY LAPAROSCOPIC;  Surgeon: [name], MD;  Location: [facility];  Service: General;  Laterality: N/A; HPI: 87 yo male adm to [facility] with abdomen pain - found to have acute gangrenous appendicitis.  He is s/p appendectomy and was tolerating soft/thin diet per notes.  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 Swallow evaluation ordered as pt failed [facility] stroke swallow screen.  Pt has h/o having undergone MBS .  Moderate esophageal dysmotility, with a presbyesophagus pattern per prior esophagram.  Subjective: pt awake in chair, pt's brother present *he had spent the night*  Recommendations for follow up therapy are one component of a multi-disciplinary discharge planning process, led by the attending physician.  Recommendations may be updated based on patient status, additional functional criteria and insurance authorization. Assessment / Plan / Recommendation   07/28/2022   8:38 AM Clinical Impressions Clinical Impression Patient's brother was present t/o MBS and assisted with interpretation for pt.  Pt presents with moderately severe oropharyngeal dysphagia with sensorimotor deficits due to his CVA.  He demonstrates weak lingual contraction/control resulting in nearly absent "mastication", prolonged oral transiting and weak propulsion.  Oral retention of secretions mixed with barium  observed post-swallow.  Pharyngeal swallow marked by inconsistently impaired tongue base retraction resulting in vallecular retention.  Partial chin tuck posture and attempt at head turn right *difficult for pt to perform - (suspect language barrier contributed)- did not improve pharyngeal clearance.  Trace aspiration of thin with sequential swallows noted due to impaired prolonged laryngeal closure.  Pt did not sense trace aspiration nor vallecular retention and cued cough/expectoration attempt was produced but was very weak and ineffective to clear.  Very poor "mastication" of cracker noted with gross retention in valleculae elevating pt.'s aspiration risk with solids.  Recommend dys1/extra gravies/sauces and thin with strict precautions.  Oral suction after meals due to oral retention of secretions.  Will follow for skilled dysphagia management.  During testing, SLP educated pt and his brother to findings/recommendations using video monitor for feedback.  Advised him that if he is coughing,  suspect highly he is aspirating and that it will be prudent to use caution - taking small single sips. SLP Visit Diagnosis Dysphagia, oropharyngeal phase (R13.12) Impact on safety and function Moderate aspiration risk;Risk for inadequate nutrition/hydration     07/28/2022   8:38 AM Treatment Recommendations Treatment Recommendations Therapy as outlined in treatment plan below     07/28/2022   8:47 AM Prognosis Prognosis for Safe Diet Advancement Good   07/28/2022   8:38 AM Diet Recommendations SLP Diet Recommendations Dysphagia 1 (Puree) solids;Thin liquid Liquid Administration via Cup;Straw Medication Administration Crushed with puree Compensations Slow rate;Small sips/bites;Other (Comment) Postural Changes Seated upright at 90 degrees;Remain semi-upright after after feeds/meals (Comment)     07/28/2022   8:38 AM Other Recommendations Oral Care Recommendations Oral care BID Follow Up Recommendations Home health SLP Assistance  recommended at discharge Frequent or constant Supervision/Assistance Functional Status Assessment Patient has had a recent decline in their functional status and demonstrates the ability to make significant improvements in function in a reasonable and predictable amount of time.   07/28/2022   8:38 AM Frequency and Duration  Speech Therapy Frequency (ACUTE ONLY) min 1 x/week Treatment Duration 1 week     07/28/2022   8:25 AM Oral Phase Oral Phase Impaired Oral - Nectar Teaspoon Decreased bolus cohesion;Delayed oral transit;Weak lingual manipulation Oral - Nectar Cup Decreased bolus cohesion;Right anterior bolus loss;Delayed oral transit;Weak lingual manipulation Oral - Nectar Straw Decreased bolus cohesion;Right anterior bolus loss;Premature spillage;Delayed oral transit;Weak lingual manipulation Oral - Thin Teaspoon Decreased bolus cohesion;Delayed oral transit;Right anterior bolus loss;Premature spillage;Weak lingual manipulation Oral - Thin Cup Decreased bolus cohesion;Premature spillage;Delayed oral transit;Weak lingual manipulation Oral - Thin Straw Decreased bolus cohesion;Delayed oral transit;Premature spillage Oral - Puree Decreased bolus cohesion;Delayed oral transit;Premature spillage;Weak lingual manipulation;Lingual pumping;Lingual/palatal residue Oral - Mech Soft Decreased bolus cohesion;Impaired mastication;Delayed oral transit;Premature spillage;Weak lingual manipulation Oral Phase - Comment head turn to the right was not performed adequately and did not prevent retention; slight chin tuck posture with nectar furhter compromised ability for pt to propel boluses into phayrnx and did not contribute to decreased vallecular retention    07/28/2022   8:26 AM Pharyngeal Phase Pharyngeal Phase Impaired Pharyngeal- Nectar Teaspoon Pharyngeal residue - valleculae;Reduced tongue base retraction Pharyngeal Material does not enter airway Pharyngeal- Nectar Cup Reduced epiglottic inversion;Pharyngeal residue -  valleculae;Reduced tongue base retraction Pharyngeal Material does not enter airway Pharyngeal- Nectar Straw Pharyngeal residue - valleculae;Reduced tongue base retraction Pharyngeal Material does not enter airway Pharyngeal- Thin Teaspoon Reduced tongue base retraction;Reduced epiglottic inversion;Pharyngeal residue - valleculae Pharyngeal- Thin Cup Reduced airway/laryngeal closure;Reduced laryngeal elevation;Penetration/Apiration after swallow Pharyngeal Material enters airway, passes BELOW cords without attempt by patient to eject out (silent aspiration) Pharyngeal- Mechanical Soft Reduced epiglottic inversion;Reduced tongue base retraction;Pharyngeal residue - valleculae Pharyngeal Comment head turn to the right was not performed adequately and did not prevent retention; slight chin tuck posture with nectar furhter compromised ability for pt to propel boluses into phayrnx and did not contribute to decreased vallecular retention; significantly weak tongue base retraction/pharyngeal swallow with cracker bolus allowed gross retention with initial swallow    07/28/2022   8:37 AM Cervical Esophageal Phase  Cervical Esophageal Phase Impaired Cervical Esophageal Comment Pt has known h/o esophageal dysmotility per prior esophagram; Upon esophageal sweep, pt appeared with minimal barium retention that mixed with secretions Rolena Infante, MS [facility] SLP Acute Rehab Services Office [phone] Pager [phone] Chales Abrahams 07/28/2022, 8:53 AM  Nicholas Level 07/29/2022   Patient ID: Elson Areas, male   DOB: [birth year], 87 y.o.   MRN: [id number]

## 2022-07-30 DIAGNOSIS — K35891 Other acute appendicitis without perforation, with gangrene: Secondary | ICD-10-CM | POA: Diagnosis not present

## 2022-07-30 LAB — CULTURE, BLOOD (ROUTINE X 2)
Culture: NO GROWTH
Culture: NO GROWTH

## 2022-07-30 NOTE — NC FL2 (Signed)
[place] MEDICAID FL2 LEVEL OF CARE SCREENING TOOL     IDENTIFICATION  Patient Name: [name] Birthdate: 1935/05/15 Sex: male Admission Date (Current Location): 07/20/2022  [facility] and [place] Number:  Herbalist and Address:  [facility],  [place], [place]      Provider Number: [id number]  Attending Physician Name and Address:  Antonieta Pert, MD  Relative Name and Phone Number:  Keym[place], Sato [phone]    Current Level of Care: Hospital Recommended Level of Care: [place] Prior Approval Number:    Date Approved/Denied:   PASRR Number: [id number]  Discharge Plan: SNF    Current Diagnoses: Patient Active Problem List   Diagnosis Date Noted   Non-English speaking patient 07/21/2022   History of subdural hematoma 07/21/2022   History of fall 07/21/2022   Acute appendicitis 07/21/2022   Acute gangrenous appendicitis s/p lap appendectomy 07/20/2022 07/20/2022   Acute encephalopathy 07/20/2022   Severe sepsis ([place]) 07/20/2022   Acute respiratory failure with hypoxia ([place]) 07/20/2022   Acute pulmonary edema ([place]) 07/20/2022   Hypothyroidism 02/23/2015   Ataxia 06/02/2013   Falling 06/02/2013   Hyperlipidemia 06/02/2013   Hypertension 02/28/2012    Orientation RESPIRATION BLADDER Height & Weight     Self  Normal Incontinent, External catheter Weight: 161 lb 9.6 oz (73.3 kg) Height:  5\' 3"  (160 cm)  BEHAVIORAL SYMPTOMS/MOOD NEUROLOGICAL BOWEL NUTRITION STATUS      Incontinent Diet (Dys 1, thin liquids)  AMBULATORY STATUS COMMUNICATION OF NEEDS Skin   Extensive Assist Verbally Other (Comment) (abdominal surgical incision)                       Personal Care Assistance Level of Assistance  Bathing, Feeding, Dressing, Total care Bathing Assistance: Maximum assistance Feeding assistance: Limited assistance Dressing Assistance: Maximum assistance Total Care Assistance: Maximum assistance    Functional Limitations Info             SPECIAL CARE FACTORS FREQUENCY  PT (By licensed PT), OT (By licensed OT), Speech therapy     PT Frequency: 5x/wk OT Frequency: 5x/wk     Speech Therapy Frequency: 5x/wk      Contractures Contractures Info: Not present    Additional Factors Info  Code Status, Allergies Code Status Info: Full Allergies Info: NKDA           Current Medications (07/30/2022):  This is the current hospital active medication list Current Facility-Administered Medications  Medication Dose Route Frequency Provider Last Rate Last Admin   0.9 %  sodium chloride infusion   Intravenous PRN Opyd, Ilene Qua, MD   Stopped at 07/21/22 1432   acetaminophen (TYLENOL) tablet 1,000 mg  1,000 mg Oral TID Michael Boston, MD   1,000 mg at 07/30/22 0857   alum & mag hydroxide-simeth (MAALOX/MYLANTA) 200-200-20 MG/5ML suspension 30 mL  30 mL Oral Q6H PRN Michael Boston, MD       aspirin chewable tablet 81 mg  81 mg Oral Daily Derek Jack, MD   81 mg at 07/30/22 0857   atorvastatin (LIPITOR) tablet 80 mg  80 mg Oral Daily Eulogio Bear U, DO   80 mg at 07/30/22 0857   bisacodyl (DULCOLAX) suppository 10 mg  10 mg Rectal Q12H PRN Michael Boston, MD       clopidogrel (PLAVIX) tablet 75 mg  75 mg Oral Daily Eulogio Bear U, DO   75 mg at 07/30/22 0857   diphenhydrAMINE (BENADRYL)  injection 12.5-25 mg  12.5-25 mg Intravenous Q6H PRN Michael Boston, MD       enoxaparin (LOVENOX) injection 40 mg  40 mg Subcutaneous Q24H Ileana Roup, MD   40 mg at 07/30/22 [id number]   food thickener (SIMPLYTHICK (NECTAR/LEVEL 2/MILDLY THICK)) 1 packet  1 packet Oral PRN Eulogio Bear U, DO       HYDROmorphone (DILAUDID) injection 0.5-1 mg  0.5-1 mg Intravenous Q4H PRN Michael Boston, MD   1 mg at 07/23/22 0645   ipratropium-albuterol (DUONEB) 0.5-2.5 (3) MG/3ML nebulizer solution 3 mL  3 mL Nebulization Q4H PRN Opyd, Ilene Qua, MD   3 mL at 07/23/22 0637   lactose free nutrition (BOOST PLUS)  liquid 237 mL  237 mL Oral TID WC Ileana Roup, MD   237 mL at 07/30/22 1043   levothyroxine (SYNTHROID) tablet 50 mcg  50 mcg Oral Q0600 Antonieta Pert, MD   50 mcg at 07/30/22 [id number]   lip balm (CARMEX) ointment   Topical BID Michael Boston, MD   Given at 07/29/22 2157   magic mouthwash  15 mL Oral QID PRN Michael Boston, MD       menthol-cetylpyridinium (CEPACOL) lozenge 3 mg  1 lozenge Oral PRN Michael Boston, MD       methocarbamol (ROBAXIN) 1,000 mg in dextrose 5 % 100 mL IVPB  1,000 mg Intravenous Q6H PRN Michael Boston, MD       methocarbamol (ROBAXIN) tablet 1,000 mg  1,000 mg Oral Q6H PRN Michael Boston, MD       ondansetron [facility]) injection 4 mg  4 mg Intravenous Q6H PRN Michael Boston, MD       Or   ondansetron (ZOFRAN) 8 mg in sodium chloride 0.9 % 50 mL IVPB  8 mg Intravenous Q6H PRN Michael Boston, MD       Oral care mouth rinse  15 mL Mouth Rinse PRN [name], MD       Oral care mouth rinse  15 mL Mouth Rinse 4 times per day Hosie Poisson, MD   15 mL at 07/30/22 1043   Oral care mouth rinse  15 mL Mouth Rinse PRN Hosie Poisson, MD       phenol (CHLORASEPTIC) mouth spray 2 spray  2 spray Mouth/Throat PRN Michael Boston, MD       polycarbophil (FIBERCON) tablet 625 mg  625 mg Oral BID Michael Boston, MD   625 mg at 07/30/22 0857   simethicone (MYLICON) 40 [id number] suspension 80 mg  80 mg Oral QID PRN Michael Boston, MD       traMADol Veatrice Bourbon) tablet 50 mg  50 mg Oral Q6H PRN [name], MD         Discharge Medications: Please see discharge summary for a list of discharge medications.  Relevant Imaging Results:  Relevant Lab Results:   Additional Information SS# [id number]  Lennart Pall, LCSW

## 2022-07-30 NOTE — TOC Progression Note (Signed)
Transition of Care [facility]) - Progression Note    Patient Details  Name: [name] MRN: [id number] Date of Birth: [birth year]  [facility] of [facility] CM/SW Contact  Lennart Pall, LCSW Phone Number: 07/30/2022, 11:39 AM  Clinical Narrative:    Alerted by MD that family now requesting change of dc plan to SNF. Able to speak with grandson, [facility], who confirms this and have started SNF bed search.   Expected Discharge Plan: [place] Barriers to Discharge: No Barriers Identified  Expected Discharge Plan and Services Expected Discharge Plan: [place] In-house Referral: NA Discharge Planning Services: CM Consult Post Acute Care Choice: [place] arrangements for the past 2 months: Apartment                 DME Arranged: N/A DME Agency: NA       HH Arranged: PT, OT HH Agency: Startup Date [place]: 07/25/22 Time [place]: 2248 Representative spoke with at [place]: Amy   Social Determinants of Health ([place]) Interventions    Readmission Risk Interventions    07/25/2022   11:43 AM  Readmission Risk Prevention Plan  Post Dischage Appt Complete  Medication Screening Complete  Transportation Screening Complete

## 2022-07-30 NOTE — Progress Notes (Signed)
Speech Language Pathology Treatment: Dysphagia  Patient Details Name: Arjan Strohm MRN: [id number] DOB: 1935-08-22 Today's Date: 07/30/2022 Time: 5397-6734 SLP Time Calculation (min) (ACUTE ONLY): 25 min  Assessment / Plan / Recommendation Clinical Impression  Patient seen by SLP for skilled treatment focused on dysphagia goals. Prior to entering the room, SLP called and spoke with patient's grandson as patient and his wife are not English-speaking and no immediate access to interpreter or interpreter service that has the correct dialect of [place]. Grandson asked about puree foods and if they could puree and bring some foods that patient likes (lentil soup, rice, etc). SLP reviewed patient's swallow precautions and was in agreement with family bringing in foods from home that adhered to his current diet of puree solids and thin liquids. Yolanda Bonine is fully aware of patient's dysphagia. SLP then entered patient's room with grandson on speaker phone and patient finishing up with PT and rehab tech. Unfortunately, patient not able to adequately hear grandson and when patient's wife talks to him, he seems to get frustrated with her. Grandson asking about patient's speech and SLP explained that ideally we would have a live interpreter here to assist with therapy sessions, especially with patient's hearing impairment. Patient declined to have any PO's at this time and he did become tearful at end of session, reaching out to SLP and gripping it tightly. Grandson stated that he would be here at the hospital tomorrow after 11am. SLP will attempt f/u next date.   HPI HPI: 87 yo male adm to [facility] with abdomen pain - found to have acute gangrenous appendicitis.  He is s/p appendectomy and was tolerating soft/thin diet per notes.  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 Swallow evaluation ordered as pt failed [facility] stroke swallow screen.  Pt has h/o having undergone MBS .  Moderate  esophageal dysmotility, with a presbyesophagus pattern per prior esophagram.      SLP Plan  Continue with current plan of care      Recommendations for follow up therapy are one component of a multi-disciplinary discharge planning process, led by the attending physician.  Recommendations may be updated based on patient status, additional functional criteria and insurance authorization.    Recommendations  Diet recommendations: Dysphagia 1 (puree);Thin liquid Liquids provided via: Cup Medication Administration: Crushed with puree Supervision: Full supervision/cueing for compensatory strategies;Staff to assist with self feeding Compensations: Slow rate;Small sips/bites;Other (Comment) Postural Changes and/or Swallow Maneuvers: Seated upright 90 degrees                Oral Care Recommendations: Oral care BID Follow Up Recommendations: Skilled nursing-short term rehab (<3 hours/day) Assistance recommended at discharge: Frequent or constant Supervision/Assistance SLP Visit Diagnosis: Dysphagia, oropharyngeal phase (R13.12) Plan: Continue with current plan of care         Sonia Baller, MA, CCC-SLP Speech Therapy

## 2022-07-30 NOTE — Progress Notes (Signed)
PROGRESS NOTE [name]  OEU:[id number] DOB: 07-May-1935 DOA: 07/20/2022 PCP: Nicholas James, MD   Brief Narrative/Hospital Course: 87 y.o. male with past medical history of hypertension, hypothyroidism, hyperlipidemia, nephrolithiasis, and SDH s/p evacuation in 2016, now presenting to the emergency department with lethargy, confusion, and fall. Initially, patient was admitted by surgery and taken to the OR  on 9/8 for laparoscopic appendectomy where he was found to have acute gangrenous perforated appendicitis.  He emerged from anesthesia hypertensive, tachycardic, wheezing, and hypoxic.  He had a chest x-ray concerning for developing pulmonary edema, was given 40 mg IV Lasix, Xopenex, and transferred to the ICU. Patient continued to make progress and he was transferred to the floor and to [facility] service.  Patient continued to be weak and deconditioned, MRI of the brain was done on 07/23/22 to evaluate for stroke, which was negative for acute intra cranial abnormalities.  He again developed trouble using the right upper extremity while the son at bedside on 9/14 and MRI was positive for CVA at that time.  He was waiting for transfer to [facility] for neurology evaluation-however patient has completed stroke work-up, transfer canceled.  Antiplatelet adjusted.  He is medically stable for discharge to skilled nursing facility as family is interested to pursue rehab    Subjective: Seen and examined Resting comfortably Wife at bedside [name] to drink his boost  Assessment and Plan: Principal Problem:   Acute gangrenous appendicitis s/p lap appendectomy 07/20/2022 Active Problems:   Ataxia   Hypertension   Hyperlipidemia   Hypothyroidism   Acute encephalopathy   Severe sepsis (HCC)   Acute respiratory failure with hypoxia (HCC)   Acute pulmonary edema (HCC)   Non-English speaking patient   History of subdural hematoma   History of fall   Acute appendicitis   Acute CVA with right sided weakness,  dysphagia dysarthria: MRI: small acute infarcts L lentiform nucleus. Stroke w/u complated:Echo EF 65 to 70%, no acute finding. .Hba1c 6.6, LDL 125 not at goal.  CT angio head and neck no emergent LVO or significant stenosis of head or neck. SLP speech eval completed able to take dysphagia 1 diet.Discussed with Dr. Selina Cooley no need to transfer at this time since patient has completed stroke work-up.  Plan is to continue aspirin 81+ Plavix 75 mg x 21 days after which Plavix monotherapy alone and continue high intensity statin.  Neuro will arrange outpatient follow-up.Continue intensive PT OT and SNF .   Acute respiratory failure with hypoxia: Needing up to 10 L [place] initially resolved with Lasix.   Acute metabolic encephalopathy w/ fatigue less verbal x1 week and also delirium and lethargy: Work-up with normal TSH B12 low normal no UTI. RPR is Negative. Initial CT unremarkable.  Mental status is improved   Hypertension: BP controlled W/O meds. Cont prn.  Goal normotension Hypothyroidism; TSH is stable continue p.o. Synthroid  Perforated gangrenous appendicitis Sepsis  2/2 above: S/p appendectomy.Completed 7 days of antibiotics.  Seen by surgery-tolerating diet no abdominal pain-plan is for outpatient follow-up in few weeks.   Bilateral foot pain chronic for many years: likely neuropathy.Would wait until he is back to baseline before starting him on Neurontin> possibly outpatient.    Staphylococcus species in 1 out of 4 blood culture bottles Likely a contaminant.  S/p antibiotics and blood cultures repeat was negative   Poor oral intake: Augment nutritional status family bringing food  DVT prophylaxis: enoxaparin (LOVENOX) injection 40 mg Start: 07/23/22 1000 SCDs Start: 07/23/22 0853 SCD's Start: 07/20/22 1939  Code Status:   Code Status: Full Code Family Communication: plan of care discussed with patient/son at bedside. Patient status is: Inpatient because of stroke work-up Level of care:  Med-Surg   Dispo: The patient is from: HOME            Anticipated disposition:Family would like to go to skilled nursing facility. TOC working on   Objective: Vitals last 24 hrs: Vitals:   07/29/22 0905 07/29/22 1409 07/29/22 2016 07/30/22 0533  BP: 128/80 124/89 (!) 123/99 (!) 142/92  Pulse: 77 95 (!) 102 81  Resp: Temp: 98 F (36.7 C) 98.3 F (36.8 C) 97.7 F (36.5 C) 98.1 F (36.7 C)  TempSrc: Oral Oral Oral Oral  SpO2: 95% 94% 98% 97%  Weight:      Height:       Weight change:   Physical Examination: General exam: Aai, frail, elderly, HEENT:Oral mucosa moist, Ear/Nose WNL grossly, dentition normal. Respiratory system: bilaterally diminished, no use of accessory muscle Cardiovascular system: S1 & S2 +, No JVD,. Gastrointestinal system: Abdomen soft,NT,ND,BS+ Nervous System:Alert, awake, weakness on RUE/RLE. Extremities: LE ankle edema neg, distal peripheral pulses palpable.  Skin: No rashes,no icterus. MSK: Normal muscle bulk,tone, power   Medications reviewed:  Scheduled Meds:  acetaminophen  1,000 mg Oral TID   aspirin  81 mg Oral Daily   atorvastatin  80 mg Oral Daily   clopidogrel  75 mg Oral Daily   enoxaparin (LOVENOX) injection  40 mg Subcutaneous Q24H   lactose free nutrition  237 mL Oral TID WC   levothyroxine  50 mcg Oral Q0600   lip balm   Topical BID   mouth rinse  15 mL Mouth Rinse 4 times per day   polycarbophil  625 mg Oral BID   Continuous Infusions:  sodium chloride Stopped (07/21/22 1432)   methocarbamol (ROBAXIN) IV     ondansetron (ZOFRAN) IV      Diet Order             DIET - DYS 1 Room service appropriate? Yes; Fluid consistency: Thin  Diet effective now                 Nutrition Problem: Increased nutrient needs Etiology: post-op healing Signs/Symptoms: estimated needs Interventions: Boost Plus   Intake/Output Summary (Last 24 hours) at 07/30/2022 0740 Last data filed at 07/30/2022 0600 Gross per 24 hour   Intake 210 ml  Output 550 ml  Net -340 ml   Net IO Since Admission: -6,835.9 mL [07/30/22 0740]  Wt Readings from Last 3 Encounters:  07/20/22 73.3 kg  05/12/20 70.4 kg  02/14/15 70.9 kg     Unresulted Labs (From admission, onward)    None     Data Reviewed: I have personally reviewed following labs and imaging studies CBC: Recent Labs  Lab 07/24/22 0433 07/27/22 0810 07/28/22 0721  WBC 7.8 10.1 6.9  NEUTROABS 4.2  --   --   HGB 14.0 15.2 14.7  HCT 42.3 44.7 44.9  MCV 97.0 96.3 98.0  PLT 208 289 280   Basic Metabolic Panel: Recent Labs  Lab 07/24/22 0433 07/27/22 0810 07/28/22 0721  NA 140 142 145  K 4.5 3.8 3.6  CL 101 104 106  CO2 32 29 30  GLUCOSE 150* 158* 152*  BUN 16 21 31*  CREATININE 0.74 0.85 0.94  CALCIUM 8.8* 9.5 9.5   GFR: Estimated Creatinine Clearance: 51.6 mL/min (by C-G formula based on SCr of 0.94 mg/dL).  Liver Function Tests: No results for input(s): "AST", "ALT", "ALKPHOS", "BILITOT", "PROT", "ALBUMIN" in the last 168 hours.  HbA1C: Recent Labs    07/27/22 0810  HGBA1C 6.6*   CBG: No results for input(s): "GLUCAP" in the last 168 hours. Lipid Profile: Recent Labs    07/27/22 0810  CHOL 192  HDL 27*  LDLCALC 125*  TRIG 198*  CHOLHDL 7.1  Sepsis Labs: Recent Labs  Lab 07/24/22 0433  LATICACIDVEN 1.2   Radiology Studies: DG Swallowing Func-Speech Pathology  Result Date: 07/28/2022 Table formatting from the original result was not included. Objective Swallowing Evaluation: Type of Study: MBS-Modified Barium Swallow Study  Patient Details Name: Micaiah Litle MRN: [id number] Date of Birth: [birth year] Today's Date: 07/28/2022 Time: SLP Start Time (ACUTE ONLY): 0745 -SLP Stop Time (ACUTE ONLY): 0818 SLP Time Calculation (min) (ACUTE ONLY): 33 min Past Medical History: Past Medical History: Diagnosis Date  Chronic kidney disease   left nephrolithiasis  Hematuria   ureteral stone and stent  Hyperlipidemia   Hypertension   Right  ureteral stone   Subdural hematoma ([place]) 02/12/2015 Past Surgical History: Past Surgical History: Procedure Laterality Date  CATARACT EXTRACTION W/ INTRAOCULAR LENS  IMPLANT, BILATERAL    CRANIOTOMY Right 02/12/2015  Procedure: Haskell Flirt for HEMATOMA EVACUATION SUBDURAL;  Surgeon: Leeroy Cha, MD;  Location: [facility];  Service: Neurosurgery;  Laterality: Right;  CYSTOSCOPY W/ URETERAL STENT PLACEMENT  01-02-11  right  CYSTOSCOPY WITH URETEROSCOPY  09/17/2011  Procedure: CYSTOSCOPY WITH URETEROSCOPY;  Surgeon: Molli Hazard, MD;  Location: [facility];  Service: Urology;  Laterality: Right;  CYSTOSCOPY, RIGHT URETEROSCOPY WITH LASER LITHO AND STENT   EXTRACORPOREAL SHOCK WAVE LITHOTRIPSY  02-05-11  right  EXTRACORPOREAL SHOCK WAVE LITHOTRIPSY Right 05/12/2020  Procedure: EXTRACORPOREAL SHOCK WAVE LITHOTRIPSY (ESWL);  Surgeon: Bjorn Loser, MD;  Location: [facility];  Service: Urology;  Laterality: Right;  LAPAROSCOPIC APPENDECTOMY N/A 07/20/2022  Procedure: APPENDECTOMY LAPAROSCOPIC;  Surgeon: [name], MD;  Location: [facility];  Service: General;  Laterality: N/A; HPI: 87 yo male adm to [facility] with abdomen pain - found to have acute gangrenous appendicitis.  He is s/p appendectomy and was tolerating soft/thin diet per notes.  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 Swallow evaluation ordered as pt failed [facility] stroke swallow screen.  Pt has h/o having undergone MBS .  Moderate esophageal dysmotility, with a presbyesophagus pattern per prior esophagram.  Subjective: pt awake in chair, pt's brother present *he had spent the night*  Recommendations for follow up therapy are one component of a multi-disciplinary discharge planning process, led by the attending physician.  Recommendations may be updated based on patient status, additional functional criteria and insurance authorization. Assessment / Plan / Recommendation   07/28/2022   8:38 AM  Clinical Impressions Clinical Impression Patient's brother was present t/o MBS and assisted with interpretation for pt.  Pt presents with moderately severe oropharyngeal dysphagia with sensorimotor deficits due to his CVA.  He demonstrates weak lingual contraction/control resulting in nearly absent "mastication", prolonged oral transiting and weak propulsion.  Oral retention of secretions mixed with barium observed post-swallow.  Pharyngeal swallow marked by inconsistently impaired tongue base retraction resulting in vallecular retention.  Partial chin tuck posture and attempt at head turn right *difficult for pt to perform - (suspect language barrier contributed)- did not improve pharyngeal clearance.  Trace aspiration of thin with sequential swallows noted due to impaired prolonged laryngeal closure.  Pt did not sense  trace aspiration nor vallecular retention and cued cough/expectoration attempt was produced but was very weak and ineffective to clear.  Very poor "mastication" of cracker noted with gross retention in valleculae elevating pt.'s aspiration risk with solids.  Recommend dys1/extra gravies/sauces and thin with strict precautions.  Oral suction after meals due to oral retention of secretions.  Will follow for skilled dysphagia management.  During testing, SLP educated pt and his brother to findings/recommendations using video monitor for feedback.  Advised him that if he is coughing, suspect highly he is aspirating and that it will be prudent to use caution - taking small single sips. SLP Visit Diagnosis Dysphagia, oropharyngeal phase (R13.12) Impact on safety and function Moderate aspiration risk;Risk for inadequate nutrition/hydration     07/28/2022   8:38 AM Treatment Recommendations Treatment Recommendations Therapy as outlined in treatment plan below     07/28/2022   8:47 AM Prognosis Prognosis for Safe Diet Advancement Good   07/28/2022   8:38 AM Diet Recommendations SLP Diet Recommendations Dysphagia  1 (Puree) solids;Thin liquid Liquid Administration via Cup;Straw Medication Administration Crushed with puree Compensations Slow rate;Small sips/bites;Other (Comment) Postural Changes Seated upright at 90 degrees;Remain semi-upright after after feeds/meals (Comment)     07/28/2022   8:38 AM Other Recommendations Oral Care Recommendations Oral care BID Follow Up Recommendations Home health SLP Assistance recommended at discharge Frequent or constant Supervision/Assistance Functional Status Assessment Patient has had a recent decline in their functional status and demonstrates the ability to make significant improvements in function in a reasonable and predictable amount of time.   07/28/2022   8:38 AM Frequency and Duration  Speech Therapy Frequency (ACUTE ONLY) min 1 x/week Treatment Duration 1 week     07/28/2022   8:25 AM Oral Phase Oral Phase Impaired Oral - Nectar Teaspoon Decreased bolus cohesion;Delayed oral transit;Weak lingual manipulation Oral - Nectar Cup Decreased bolus cohesion;Right anterior bolus loss;Delayed oral transit;Weak lingual manipulation Oral - Nectar Straw Decreased bolus cohesion;Right anterior bolus loss;Premature spillage;Delayed oral transit;Weak lingual manipulation Oral - Thin Teaspoon Decreased bolus cohesion;Delayed oral transit;Right anterior bolus loss;Premature spillage;Weak lingual manipulation Oral - Thin Cup Decreased bolus cohesion;Premature spillage;Delayed oral transit;Weak lingual manipulation Oral - Thin Straw Decreased bolus cohesion;Delayed oral transit;Premature spillage Oral - Puree Decreased bolus cohesion;Delayed oral transit;Premature spillage;Weak lingual manipulation;Lingual pumping;Lingual/palatal residue Oral - Mech Soft Decreased bolus cohesion;Impaired mastication;Delayed oral transit;Premature spillage;Weak lingual manipulation Oral Phase - Comment head turn to the right was not performed adequately and did not prevent retention; slight chin tuck posture with  nectar furhter compromised ability for pt to propel boluses into phayrnx and did not contribute to decreased vallecular retention    07/28/2022   8:26 AM Pharyngeal Phase Pharyngeal Phase Impaired Pharyngeal- Nectar Teaspoon Pharyngeal residue - valleculae;Reduced tongue base retraction Pharyngeal Material does not enter airway Pharyngeal- Nectar Cup Reduced epiglottic inversion;Pharyngeal residue - valleculae;Reduced tongue base retraction Pharyngeal Material does not enter airway Pharyngeal- Nectar Straw Pharyngeal residue - valleculae;Reduced tongue base retraction Pharyngeal Material does not enter airway Pharyngeal- Thin Teaspoon Reduced tongue base retraction;Reduced epiglottic inversion;Pharyngeal residue - valleculae Pharyngeal- Thin Cup Reduced airway/laryngeal closure;Reduced laryngeal elevation;Penetration/Apiration after swallow Pharyngeal Material enters airway, passes BELOW cords without attempt by patient to eject out (silent aspiration) Pharyngeal- Mechanical Soft Reduced epiglottic inversion;Reduced tongue base retraction;Pharyngeal residue - valleculae Pharyngeal Comment head turn to the right was not performed adequately and did not prevent retention; slight chin tuck posture with nectar furhter compromised ability for pt to propel boluses into phayrnx and did not contribute to  decreased vallecular retention; significantly weak tongue base retraction/pharyngeal swallow with cracker bolus allowed gross retention with initial swallow    07/28/2022   8:37 AM Cervical Esophageal Phase  Cervical Esophageal Phase Impaired Cervical Esophageal Comment Pt has known h/o esophageal dysmotility per prior esophagram; Upon esophageal sweep, pt appeared with minimal barium retention that mixed with secretions Rolena Infanteammy K, MS [facility] SLP Acute Rehab Services Office [phone] Pager [phone] Chales AbrahamsKimball, Tamara Ann 07/28/2022, 8:53 AM                       LOS: 9 days   Lanae Boastamesh Jontavius Rabalais, MD [facility]  07/30/2022,  7:40 AM

## 2022-07-30 NOTE — Progress Notes (Signed)
PT   RE_EVAL  Patient Details Name: [name] MRN: [id number] DOB: [birth year] Today's Date: 07/30/2022   History of Present Illness Patient is a 87 year old male admitted with acute appendicitis s/p appendectomy 9/8, acute respiratory failure, sepsis. patient was noted to have developed trouble using the right upper extremity while the son at bedside on 9/14 and MRI was positive for CVA at that time Hx of SDH s/p evacuation 2016, hypothyroidism, falls, ataxia.    PT Comments    Pt cooperative with PT, responding to tactile cues to improve sitting balance. Continue to recommend SNF. Will follow and progress as able   Recommendations for follow up therapy are one component of a multi-disciplinary discharge planning process, led by the attending physician.  Recommendations may be updated based on patient status, additional functional criteria and insurance authorization.  Follow Up Recommendations  SNF     Assistance Recommended at Discharge Frequent or constant Supervision/Assistance  Patient can return home with the following A little help with walking and/or transfers;A little help with bathing/dressing/bathroom   Equipment Recommendations  None recommended by PT    Recommendations for Other Services       Precautions / Restrictions Precautions Precautions: Fall Restrictions Weight Bearing Restrictions: No Other Position/Activity Restrictions: R sided weakness     Mobility  Bed Mobility Overal bed mobility: Needs Assistance Bed Mobility: Rolling, Sidelying to Sit, Sit to Supine Rolling: Max assist, +2 for safety/equipment Sidelying to sit: Max assist   Sit to supine: Max assist, +2 for safety/equipment   General bed mobility comments: visual and tactile cues to come to sit, assist with RLE and to elevate trunk; pt assists lifting RLE into bed    Transfers                   General transfer comment: NT-unable    Ambulation/Gait                    Stairs             Wheelchair Mobility    Modified Rankin (Stroke Patients Only) Modified Rankin (Stroke Patients Only) Pre-Morbid Rankin Score: No significant disability Modified Rankin: Severe disability     Balance Overall balance assessment: Needs assistance Sitting-balance support: Feet supported, Single extremity supported Sitting balance-Leahy Scale: Poor Sitting balance - Comments: varying levels of assist --close supervision (briefly able to maintain midline) to max assist with heavy R lateral lean Postural control: Right lateral lean     Standing balance comment: unable                            Cognition Arousal/Alertness: Awake/alert Behavior During Therapy: WFL for tasks assessed/performed Overall Cognitive Status: Difficult to assess                                 General Comments: grandson assisting with interpreting via phone. wife speaks some English however pt does not seem to want to listen to her/grandson confirms this        Exercises Other Exercises Other Exercises: P/AAROM RLE    General Comments        Pertinent Vitals/Pain Pain Assessment Pain Assessment: Faces Faces Pain Scale: Hurts a little bit Pain Location: grimaces with return to supine Pain Descriptors / Indicators: Grimacing Pain Intervention(s): Limited activity within patient's tolerance, Monitored during session, Repositioned  Home Living                          Prior Function            PT Goals (current goals can now be found in the care plan section) Acute Rehab PT Goals Patient Stated Goal: none stated PT Goal Formulation: With patient/family Time For Goal Achievement: 08/05/22 Potential to Achieve Goals: Fair    Frequency    Min 4X/week      PT Plan      Co-evaluation              AM-PAC PT "6 Clicks" Mobility   Outcome Measure  Help needed turning from your back to your side while in a flat  bed without using bedrails?: A Lot Help needed moving from lying on your back to sitting on the side of a flat bed without using bedrails?: Total Help needed moving to and from a bed to a chair (including a wheelchair)?: Total Help needed standing up from a chair using your arms (e.g., wheelchair or bedside chair)?: Total Help needed to walk in hospital room?: Total Help needed climbing 3-5 steps with a railing? : Total 6 Click Score: 7    End of Session   Activity Tolerance: Patient tolerated treatment well Patient left: in bed;with call bell/phone within reach;with bed alarm set;with family/visitor present;Other (comment) (ST) Nurse Communication: Mobility status PT Visit Diagnosis: Muscle weakness (generalized) (M62.81);Difficulty in walking, not elsewhere classified (R26.2)     Time: 8182-9937 PT Time Calculation (min) (ACUTE ONLY): 16 min  Charges:                        Baxter Flattery, PT  Acute Rehab Dept [facility]) [phone]  [facility] Weekend Pager [facility] only)  [phone]  07/30/2022    [facility] 07/30/2022, 3:29 PM

## 2022-07-30 NOTE — Care Management Important Message (Signed)
Important Message  Patient Details IM Letter placed in Patients room. Name: [name] MRN: [id number] Date of Birth: 02/25/1935   Medicare Important Message Given:  Yes     Kerin Salen 07/30/2022, 1:19 PM

## 2022-07-31 DIAGNOSIS — G9341 Metabolic encephalopathy: Secondary | ICD-10-CM | POA: Diagnosis not present

## 2022-07-31 DIAGNOSIS — Z87442 Personal history of urinary calculi: Secondary | ICD-10-CM | POA: Diagnosis not present

## 2022-07-31 DIAGNOSIS — M4312 Spondylolisthesis, cervical region: Secondary | ICD-10-CM | POA: Diagnosis not present

## 2022-07-31 DIAGNOSIS — Z0189 Encounter for other specified special examinations: Secondary | ICD-10-CM | POA: Diagnosis not present

## 2022-07-31 DIAGNOSIS — H5789 Other specified disorders of eye and adnexa: Secondary | ICD-10-CM | POA: Diagnosis not present

## 2022-07-31 DIAGNOSIS — R634 Abnormal weight loss: Secondary | ICD-10-CM | POA: Diagnosis not present

## 2022-07-31 DIAGNOSIS — Z7401 Bed confinement status: Secondary | ICD-10-CM | POA: Diagnosis not present

## 2022-07-31 DIAGNOSIS — J432 Centrilobular emphysema: Secondary | ICD-10-CM | POA: Diagnosis not present

## 2022-07-31 DIAGNOSIS — J9601 Acute respiratory failure with hypoxia: Secondary | ICD-10-CM | POA: Diagnosis not present

## 2022-07-31 DIAGNOSIS — K3532 Acute appendicitis with perforation and localized peritonitis, without abscess: Secondary | ICD-10-CM | POA: Diagnosis not present

## 2022-07-31 DIAGNOSIS — R0989 Other specified symptoms and signs involving the circulatory and respiratory systems: Secondary | ICD-10-CM | POA: Diagnosis not present

## 2022-07-31 DIAGNOSIS — R6889 Other general symptoms and signs: Secondary | ICD-10-CM | POA: Diagnosis not present

## 2022-07-31 DIAGNOSIS — K5909 Other constipation: Secondary | ICD-10-CM | POA: Diagnosis not present

## 2022-07-31 DIAGNOSIS — R270 Ataxia, unspecified: Secondary | ICD-10-CM | POA: Diagnosis not present

## 2022-07-31 DIAGNOSIS — I69391 Dysphagia following cerebral infarction: Secondary | ICD-10-CM | POA: Diagnosis not present

## 2022-07-31 DIAGNOSIS — R531 Weakness: Secondary | ICD-10-CM | POA: Diagnosis not present

## 2022-07-31 DIAGNOSIS — N189 Chronic kidney disease, unspecified: Secondary | ICD-10-CM | POA: Diagnosis not present

## 2022-07-31 DIAGNOSIS — M6281 Muscle weakness (generalized): Secondary | ICD-10-CM | POA: Diagnosis not present

## 2022-07-31 DIAGNOSIS — I63412 Cerebral infarction due to embolism of left middle cerebral artery: Secondary | ICD-10-CM | POA: Diagnosis not present

## 2022-07-31 DIAGNOSIS — E785 Hyperlipidemia, unspecified: Secondary | ICD-10-CM | POA: Diagnosis not present

## 2022-07-31 DIAGNOSIS — R1084 Generalized abdominal pain: Secondary | ICD-10-CM | POA: Diagnosis not present

## 2022-07-31 DIAGNOSIS — Z743 Need for continuous supervision: Secondary | ICD-10-CM | POA: Diagnosis not present

## 2022-07-31 DIAGNOSIS — N471 Phimosis: Secondary | ICD-10-CM | POA: Diagnosis not present

## 2022-07-31 DIAGNOSIS — K35891 Other acute appendicitis without perforation, with gangrene: Secondary | ICD-10-CM | POA: Diagnosis not present

## 2022-07-31 DIAGNOSIS — I10 Essential (primary) hypertension: Secondary | ICD-10-CM | POA: Diagnosis not present

## 2022-07-31 DIAGNOSIS — E559 Vitamin D deficiency, unspecified: Secondary | ICD-10-CM | POA: Diagnosis not present

## 2022-07-31 DIAGNOSIS — N481 Balanitis: Secondary | ICD-10-CM | POA: Diagnosis not present

## 2022-07-31 DIAGNOSIS — R404 Transient alteration of awareness: Secondary | ICD-10-CM | POA: Diagnosis not present

## 2022-07-31 DIAGNOSIS — R262 Difficulty in walking, not elsewhere classified: Secondary | ICD-10-CM | POA: Diagnosis not present

## 2022-07-31 DIAGNOSIS — B370 Candidal stomatitis: Secondary | ICD-10-CM | POA: Diagnosis not present

## 2022-07-31 DIAGNOSIS — K5901 Slow transit constipation: Secondary | ICD-10-CM | POA: Diagnosis not present

## 2022-07-31 DIAGNOSIS — I6782 Cerebral ischemia: Secondary | ICD-10-CM | POA: Diagnosis not present

## 2022-07-31 DIAGNOSIS — N368 Other specified disorders of urethra: Secondary | ICD-10-CM | POA: Diagnosis not present

## 2022-07-31 DIAGNOSIS — S065X9S Traumatic subdural hemorrhage with loss of consciousness of unspecified duration, sequela: Secondary | ICD-10-CM | POA: Diagnosis not present

## 2022-07-31 DIAGNOSIS — I517 Cardiomegaly: Secondary | ICD-10-CM | POA: Diagnosis not present

## 2022-07-31 DIAGNOSIS — I69322 Dysarthria following cerebral infarction: Secondary | ICD-10-CM | POA: Diagnosis not present

## 2022-07-31 DIAGNOSIS — E569 Vitamin deficiency, unspecified: Secondary | ICD-10-CM | POA: Diagnosis not present

## 2022-07-31 DIAGNOSIS — I509 Heart failure, unspecified: Secondary | ICD-10-CM | POA: Diagnosis not present

## 2022-07-31 DIAGNOSIS — H903 Sensorineural hearing loss, bilateral: Secondary | ICD-10-CM | POA: Diagnosis not present

## 2022-07-31 DIAGNOSIS — J810 Acute pulmonary edema: Secondary | ICD-10-CM | POA: Diagnosis not present

## 2022-07-31 DIAGNOSIS — R051 Acute cough: Secondary | ICD-10-CM | POA: Diagnosis not present

## 2022-07-31 DIAGNOSIS — I639 Cerebral infarction, unspecified: Secondary | ICD-10-CM | POA: Diagnosis not present

## 2022-07-31 DIAGNOSIS — E46 Unspecified protein-calorie malnutrition: Secondary | ICD-10-CM | POA: Diagnosis not present

## 2022-07-31 DIAGNOSIS — L539 Erythematous condition, unspecified: Secondary | ICD-10-CM | POA: Diagnosis not present

## 2022-07-31 DIAGNOSIS — R059 Cough, unspecified: Secondary | ICD-10-CM | POA: Diagnosis not present

## 2022-07-31 DIAGNOSIS — E872 Acidosis, unspecified: Secondary | ICD-10-CM | POA: Diagnosis not present

## 2022-07-31 DIAGNOSIS — E039 Hypothyroidism, unspecified: Secondary | ICD-10-CM | POA: Diagnosis not present

## 2022-07-31 MED ORDER — CLOPIDOGREL BISULFATE 75 MG PO TABS: 75.0000 mg | ORAL_TABLET | Freq: Every day | ORAL | 0 refills | Status: AC

## 2022-07-31 MED ORDER — ATORVASTATIN CALCIUM 80 MG PO TABS: 80.0000 mg | ORAL_TABLET | Freq: Every day | ORAL | 0 refills | Status: AC

## 2022-07-31 MED ORDER — LOSARTAN POTASSIUM 25 MG PO TABS
25.0000 mg | ORAL_TABLET | Freq: Every day | ORAL | Status: DC
  Administered 2022-07-31: 25 mg via ORAL
  Filled 2022-07-31: qty 1

## 2022-07-31 MED ORDER — ASPIRIN 81 MG PO TBEC: 81.0000 mg | DELAYED_RELEASE_TABLET | Freq: Every morning | ORAL | 0 refills | Status: AC

## 2022-07-31 NOTE — TOC Transition Note (Signed)
Transition of Care [facility]) - CM/SW Discharge Note   Patient Details  Name: [name] MRN: [id number] Date of Birth: 01-Dec-1934  [facility]) CM/SW Contact:  Lennart Pall, LCSW Phone Number: 07/31/2022, 1:24 PM   Clinical Narrative:    Pt medically ready for dc to SNF today.  Bed accepted at [facility] and insurance authorization received.  Pt and family aware/ agreeable with dc today.  PTAR called at 1:10pm.  RN to call report to [phone].  No further TOC needs.   Final next level of care: Skilled Nursing Facility Barriers to Discharge: Barriers Resolved   Patient Goals and CMS Choice Patient states their goals for this hospitalization and ongoing recovery are:: To return home   Choice offered to / list presented to : Patient  Discharge Placement PASRR number recieved: 07/30/22            Patient chooses bed at: [facility] Patient to be transferred to facility by: [place] Name of family member notified: grandson Patient and family notified of of transfer: 07/31/22  Discharge Plan and Services In-house Referral: NA Discharge Planning Services: CM Consult Post Acute Care Choice: Home Health          DME Arranged: N/A DME Agency: NA       HH Arranged: NA HH Agency: NA Date HH Agency Contacted: 07/25/22 Time [place]: 2725 Representative spoke with at [place]: Amy  Social Determinants of Health ([place]) Interventions     Readmission Risk Interventions    07/25/2022   11:43 AM  Readmission Risk Prevention Plan  Post Dischage Appt Complete  Medication Screening Complete  Transportation Screening Complete

## 2022-07-31 NOTE — Progress Notes (Addendum)
Occupational Therapy Treatment Patient Details Name: [name] MRN: [id number] DOB: [birth year] Today's Date: 07/31/2022   History of present illness Patient is a 87 year old male admitted with acute appendicitis s/p appendectomy 9/8, acute respiratory failure, sepsis. patient was noted to have developed trouble using the right upper extremity while the son at bedside on 9/14 and MRI was positive for CVA at that time Hx of SDH s/p evacuation 2016, hypothyroidism, falls, ataxia.   OT comments  Patient was noted to have minimal movement in hand supine in bed with grandson present to translate for patient on this date. Patient was unable to repeat action later in session.  Patient unable to use interpretation ipad with HOH and rare dialect. Patient  was noted to have continued lateral lean sitting EOB with brief moment of patient able to sit on edge of bed with min guard. Patient noted to be quick to fatigue. Patients grandson was educated on proper positioning of patient, simple ROM that can be completed with L side and encouraged on R side as well as not pulling on RUE and guarding when moving. Grandson verbalized understanding. Patient's discharge plan remains appropriate at this time. OT will continue to follow acutely.     Recommendations for follow up therapy are one component of a multi-disciplinary discharge planning process, led by the attending physician.  Recommendations may be updated based on patient status, additional functional criteria and insurance authorization.    Follow Up Recommendations  Skilled nursing-short term rehab (<3 hours/day)    Assistance Recommended at Discharge Frequent or constant Supervision/Assistance  Patient can return home with the following  A lot of help with bathing/dressing/bathroom;Direct supervision/assist for financial management;Direct supervision/assist for medications management;Two people to help with walking and/or transfers   Equipment  Recommendations  BSC/3in1    Recommendations for Other Services      Precautions / Restrictions Precautions Precautions: Fall Precaution Comments: monitor O2 Restrictions Weight Bearing Restrictions: No Other Position/Activity Restrictions: R sided weakness       Mobility Bed Mobility Overal bed mobility: Needs Assistance   Rolling: Max assist     Sit to supine: Max assist   General bed mobility comments: visual and tactile cues to come to sit, assist with RLE and RUE to sit on edge of bed and prevent RUE from being left behind.      Balance Overall balance assessment: Needs assistance Sitting-balance support: Feet supported, Single extremity supported Sitting balance-Leahy Scale: Poor Sitting balance - Comments: patient was noted to have lateral lean to R side with varying levels of assist one brief moment of min guard or sitting balance with patient quick to fatigue back to mod-max A Postural control: Right lateral lean, Posterior lean             ADL either performed or assessed with clinical judgement   ADL Overall ADL's : Needs assistance/impaired               General ADL Comments: patients grandson was educated on positioning in bed to support BLE and BUE and prevent breakdown. patients grandson verbalized understanding. patient was noted to be quick to fatigue sitting on EOB with brief moment of sitting upright with no R lateral leaning with patient strong UE support on L side. patient was noted to have R lateral lean and posterior leaning with grandson aiding in cuing patient for sitting balance after education was provided with guidance of therapist. patietn needed mod to max A for sitting balance with increased  fatigue noted.    Extremity/Trunk Assessment Upper Extremity Assessment RUE Deficits / Details: patient was noted to have some firing of scapula with attemptes at shouler shurgs. patietn unable to participate in shoulder retration even with cues.  patient noted to have brief moment of being able to grasp therapists hand and move elbow with AROM on R side but unable to complete later in session. difficult to assess if this is due to patient confusion, fatigue or effects of CVA.             Cognition Arousal/Alertness: Awake/alert Behavior During Therapy: WFL for tasks assessed/performed Overall Cognitive Status: Difficult to assess             General Comments: grandson was present today with patient able to engage a little more with tasks.                   Pertinent Vitals/ Pain       Pain Assessment Pain Assessment: Faces Faces Pain Scale: No hurt         Frequency  Min 2X/week        Progress Toward Goals  OT Goals(current goals can now be found in the care plan section)  Progress towards OT goals: Progressing toward goals     Plan Discharge plan remains appropriate       AM-PAC OT "6 Clicks" Daily Activity     Outcome Measure   Help from another person eating meals?: A Lot Help from another person taking care of personal grooming?: A Lot Help from another person toileting, which includes using toliet, bedpan, or urinal?: Total Help from another person bathing (including washing, rinsing, drying)?: Total Help from another person to put on and taking off regular upper body clothing?: A Lot Help from another person to put on and taking off regular lower body clothing?: Total 6 Click Score: 9    End of Session    OT Visit Diagnosis: Muscle weakness (generalized) (M62.81);Pain   Activity Tolerance Patient limited by fatigue   Patient Left in bed;with call bell/phone within reach;with bed alarm set;with family/visitor present   Nurse Communication Mobility status        Time: [id number] OT Time Calculation (min): 20 min  Charges: OT General Charges $OT Visit: 1 Visit OT Treatments $Therapeutic Activity: 8-22 min  Rennie Plowman, MS Acute Rehabilitation Department Office#  [phone]   Marcellina Millin 07/31/2022, 12:04 PM

## 2022-07-31 NOTE — Progress Notes (Signed)
PROGRESS NOTE [name]  SEG:[id number] DOB: 09/04/1935 DOA: 07/20/2022 PCP: Donald Prose, MD   Brief Narrative/Hospital Course: 87 y.o. male with past medical history of hypertension, hypothyroidism, hyperlipidemia, nephrolithiasis, and SDH s/p evacuation in 2016, now presenting to the emergency department with lethargy, confusion, and fall. Initially, patient was admitted by surgery and taken to the OR  on 9/8 for laparoscopic appendectomy where he was found to have acute gangrenous perforated appendicitis.  He emerged from anesthesia hypertensive, tachycardic, wheezing, and hypoxic.  He had a chest x-ray concerning for developing pulmonary edema, was given 40 mg IV Lasix, Xopenex, and transferred to the ICU. Patient continued to make progress and he was transferred to the floor and to [facility] service.  Patient continued to be weak and deconditioned, MRI of the brain was done on 07/23/22 to evaluate for stroke, which was negative for acute intra cranial abnormalities.  He again developed trouble using the right upper extremity while the son at bedside on 9/14 and MRI was positive for CVA at that time.  He was waiting for transfer to [facility] for neurology evaluation-however patient has completed stroke work-up, transfer canceled.  Antiplatelet adjusted.  He is medically stable for discharge to skilled nursing facility as family is interested to pursue rehab    Subjective: Seen and examined this morning resting comfortably alert awake at baseline.  Daughter-in-law at the bedside.   No complaints overnight.  Assessment and Plan: Principal Problem:   Acute gangrenous appendicitis s/p lap appendectomy 07/20/2022 Active Problems:   Ataxia   Hypertension   Hyperlipidemia   Hypothyroidism   Acute encephalopathy   Severe sepsis (HCC)   Acute respiratory failure with hypoxia (HCC)   Acute pulmonary edema ([place])   Non-English speaking patient   History of subdural hematoma   History of fall   Acute  appendicitis   Acute CVA -L lentiform nucleus with right sided weakness, dysphagia dysarthria: Completed stroke work-up with MRI: small acute infarcts . Stroke w/u complated:Echo EF 65 to 70%, no acute finding. .Hba1c 6.6, LDL 125 not at goal.  CT angio head and neck no emergent LVO or significant stenosis of head or neck. SLP speech eval completed : ON dysphagia 1 diet.Discussed with Dr. Quinn Axe no need to transfer at this time since patient has completed stroke work-up.  Plan is to continue aspirin 81+ Plavix 75 mg x 21 days after which Plavix monotherapy alone and continue high intensity statin.  Neuro will arrange outpatient follow-up.Continue intensive PT OT and SNF .   Acute respiratory failure with hypoxia: Needing up to 10 L Delta initially resolved with Lasix.on RA now.   Acute metabolic encephalopathy w/ fatigue less verbal x1 week and also delirium and lethargy: Work-up with normal TSH B12 low normal no UTI. RPR is Negative. Initial CT unremarkable.  Mental status is improved   Hypertension: BP controlled 130s to 140s W/O meds. Cont prn.  Hypothyroidism; TSH is stable continue p.o. Synthroid  Perforated gangrenous appendicitis Sepsis  2/2 above: S/p appendectomy.Completed 7 days of antibiotics.  Seen by surgery-tolerating diet no abdominal pain-plan is for outpatient follow-up in few weeks.   Bilateral foot pain chronic for many years: likely neuropathy.Would wait until he is back to baseline before starting him on Neurontin> possibly outpatient.    Staphylococcus species in 1 out of 4 blood culture bottles Likely a contaminant.  S/p antibiotics and blood cultures repeat was negative   Poor oral intake: Augment nutritional status family bringing food  DVT prophylaxis: enoxaparin (  LOVENOX) injection 40 mg Start: 07/23/22 1000 SCDs Start: 07/23/22 0853 SCD's Start: 07/20/22 1939 Code Status:   Code Status: Full Code Family Communication: plan of care discussed with patient/son at  bedside. Patient status is: Inpatient because of stroke work-up Level of care: Med-Surg   Dispo: The patient is from: HOME            Anticipated disposition: SNF once bed available  Objective: Vitals last 24 hrs: Vitals:   07/30/22 0533 07/30/22 1250 07/30/22 2023 07/31/22 0429  BP: (!) 142/92 (!) 143/88 (!) 135/99 (!) 148/76  Pulse: 81 87 90 70  Resp: 18 18 18 18   Temp: 98.1 F (36.7 C) 98.8 F (37.1 C) 98.3 F (36.8 C) 98.3 F (36.8 C)  TempSrc: Oral Oral Oral Oral  SpO2: 97% 93% 94% 95%  Weight:      Height:       Weight change:   Physical Examination: General exam: AA, older than stated age, weak appearing. HEENT:Oral mucosa moist, Ear/Nose WNL grossly, dentition normal. Respiratory system: bilaterally diminished, no use of accessory muscle Cardiovascular system: S1 & S2 +, No JVD,. Gastrointestinal system: Abdomen soft,NT,ND,BS+ Nervous System:Alert, awake, RUE/RLE no movement moving well on left side  Extremities: LE ankle edema neg, distal peripheral pulses palpable.  Skin: No rashes,no icterus. MSK: Normal muscle bulk,tone, power   Medications reviewed:  Scheduled Meds:  acetaminophen  1,000 mg Oral TID   aspirin  81 mg Oral Daily   atorvastatin  80 mg Oral Daily   clopidogrel  75 mg Oral Daily   enoxaparin (LOVENOX) injection  40 mg Subcutaneous Q24H   lactose free nutrition  237 mL Oral TID WC   levothyroxine  50 mcg Oral Q0600   lip balm   Topical BID   mouth rinse  15 mL Mouth Rinse 4 times per day   polycarbophil  625 mg Oral BID   Continuous Infusions:  sodium chloride Stopped (07/21/22 1432)   methocarbamol (ROBAXIN) IV     ondansetron (ZOFRAN) IV      Diet Order             DIET - DYS 1 Room service appropriate? Yes; Fluid consistency: Thin  Diet effective now                 Nutrition Problem: Increased nutrient needs Etiology: post-op healing Signs/Symptoms: estimated needs Interventions: Boost Plus   Intake/Output Summary  (Last 24 hours) at 07/31/2022 1028 Last data filed at 07/31/2022 1000 Gross per 24 hour  Intake 420 ml  Output 950 ml  Net -530 ml    Net IO Since Admission: -7,615.9 mL [07/31/22 1028]  Wt Readings from Last 3 Encounters:  07/20/22 73.3 kg  05/12/20 70.4 kg  02/14/15 70.9 kg     Unresulted Labs (From admission, onward)    None     Data Reviewed: I have personally reviewed following labs and imaging studies CBC: Recent Labs  Lab 07/27/22 0810 07/28/22 0721  WBC 10.1 6.9  HGB 15.2 14.7  HCT 44.7 44.9  MCV 96.3 98.0  PLT 289 280    Basic Metabolic Panel: Recent Labs  Lab 07/27/22 0810 07/28/22 0721  NA 142 145  K 3.8 3.6  CL 104 106  CO2 29 30  GLUCOSE 158* 152*  BUN 21 31*  CREATININE 0.85 0.94  CALCIUM 9.5 9.5    GFR: Estimated Creatinine Clearance: 51.6 mL/min (by C-G formula based on SCr of 0.94 mg/dL). Liver Function Tests: No results  for input(s): "AST", "ALT", "ALKPHOS", "BILITOT", "PROT", "ALBUMIN" in the last 168 hours.  HbA1C: No results for input(s): "HGBA1C" in the last 72 hours.  CBG: No results for input(s): "GLUCAP" in the last 168 hours. Lipid Profile: No results for input(s): "CHOL", "HDL", "LDLCALC", "TRIG", "CHOLHDL", "LDLDIRECT" in the last 72 hours. Sepsis Labs: No results for input(s): "PROCALCITON", "LATICACIDVEN" in the last 168 hours.  Radiology Studies: No results found.   LOS: 10 days   Lanae Boast, MD [facility]  07/31/2022, 10:28 AM

## 2022-07-31 NOTE — Discharge Summary (Signed)
Physician Discharge Summary  [name] WJX:[id number] DOB: March 08, 1935 DOA: 07/20/2022  PCP: Deatra James, MD  Admit date: 07/20/2022 Discharge date: 07/31/2022 Recommendations for Outpatient Follow-up:  Follow up with PCP in 1 weeks-call for appointment Follow-up with neurology as outpatient Please obtain BMP/CBC in one week  Discharge Dispo: SNF Discharge Condition: Stable Code Status:   Code Status: Full Code Diet recommendation:  Diet Order             Diet - low sodium heart healthy           DIET - DYS 1 Room service appropriate? Yes; Fluid consistency: Thin  Diet effective now                  Brief/Interim Summary: 87 y.o. male with past medical history of hypertension, hypothyroidism, hyperlipidemia, nephrolithiasis, and SDH s/p evacuation in 2016, now presenting to the emergency department with lethargy, confusion, and fall. Initially, patient was admitted by surgery and taken to the OR  on 9/8 for laparoscopic appendectomy where he was found to have acute gangrenous perforated appendicitis.  He emerged from anesthesia hypertensive, tachycardic, wheezing, and hypoxic.  He had a chest x-ray concerning for developing pulmonary edema, was given 40 mg IV Lasix, Xopenex, and transferred to the ICU. Patient continued to make progress and he was transferred to the floor and to [facility] service.  Patient continued to be weak and deconditioned, MRI of the brain was done on 07/23/22 to evaluate for stroke, which was negative for acute intra cranial abnormalities.  He again developed trouble using the right upper extremity while the son at bedside on 9/14 and MRI was positive for CVA at that time.  He was waiting for transfer to [facility] for neurology evaluation-however patient has completed stroke work-up, transfer canceled.  Antiplatelet adjusted.  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 Stroke w/u complated:Echo EF 65 to 70%, no acute finding. .Hba1c 6.6, LDL 125 not at goal.  CT angio head and neck no emergent LVO or significant stenosis of head or neck. SLP speech eval completed : ON dysphagia 1 diet.Discussed with Dr. Selina Cooley no need to transfer at this time since patient has completed stroke work-up.  Plan is to continue aspirin 81+ Plavix 75 mg x 21 days after which Plavix monotherapy alone and continue high intensity statin.  Neuro will arrange outpatient follow-up.Continue intensive PT OT and SNF .   Acute respiratory failure with hypoxia: Needing up to 10 L Hodgeman initially resolved with Lasix.on RA now.   Acute metabolic encephalopathy w/ fatigue less verbal x1 week and also delirium and lethargy: Work-up with normal TSH B12 low normal no UTI. RPR is Negative. Initial CT unremarkable.  Mental status is improved   Hypertension: BP controlled 130s to 140s- resume losartan 25 mg. Cont prn.  Hypothyroidism; TSH is stable continue p.o. Synthroid   Perforated gangrenous appendicitis Sepsis  2/2 above: S/p appendectomy.Completed 7 days of antibiotics.  Seen by surgery-tolerating diet no abdominal pain-plan is for outpatient follow-up in few weeks.   Bilateral foot pain chronic for many years: likely neuropathy.Would wait until he is back to baseline before starting him on Neurontin> possibly outpatient.    Staphylococcus species in 1 out of 4 blood culture bottles Likely  a contaminant.  S/p antibiotics and blood cultures repeat was negative    Poor oral intake: Augment nutritional status family bringing food  Consults: Neurology  Subjective: Seen and examined this morning.  Resting comfortably, at the bedside autoregulated interactive.  No movement in RLE and RUE.  Discharge Exam: Vitals:   07/30/22 2023 07/31/22 0429  BP: (!) 135/99 (!) 148/76  Pulse: 90 70  Resp: 18 18  Temp: 98.3 F (36.8 C) 98.3 F (36.8 C)  SpO2: 94% 95%   General: Pt is alert, awake, not in acute distress Cardiovascular: RRR, S1/S2 +, no rubs, no gallops Respiratory: CTA bilaterally, no wheezing, no rhonchi Abdominal: Soft, NT, ND, bowel sounds + Extremities: no edema, no cyanosis  Discharge Instructions  Discharge Instructions     Ambulatory referral to Neurology   Complete by: As directed    An appointment is requested in approximately: 4-6 wks   Diet - low sodium heart healthy   Complete by: As directed    Discharge instructions   Complete by: As directed    He will need to continue aspirin until 08/19/2022, continue Plavix indefinitely for now to manage her stroke, follow-up with neurology as outpatient and at PCP.  Please call call MD or return to ER for similar or worsening recurring problem that brought you to hospital or if any fever,nausea/vomiting,abdominal pain, uncontrolled pain, chest pain,  shortness of breath or any other alarming symptoms.  Please follow-up your doctor as instructed in a week time and call the office for appointment.  Please avoid alcohol, smoking, or any other illicit substance and maintain healthy habits including taking your regular medications as prescribed.  You were cared for by a hospitalist during your hospital stay. If you have any questions about your discharge medications or the care you received while you were in the hospital after you are discharged, you can call the unit and ask to speak with the hospitalist on call if  the hospitalist that took care of you is not available.  Once you are discharged, your primary care physician will handle any further medical issues. Please note that NO REFILLS for any discharge medications will be authorized once you are discharged, as it is imperative that you return to your primary care physician (or establish a relationship with a primary care physician if you do not have one) for your aftercare needs so that they can reassess your need for medications and monitor your lab values   Increase activity slowly   Complete by: As directed    No wound care   Complete by: As directed       Allergies as of 07/31/2022   No Known Allergies      Medication List     STOP taking these medications    amLODipine 5 MG tablet Commonly known as: NORVASC   HYDROcodone-acetaminophen 5-325 MG tablet Commonly known as: NORCO/VICODIN   simvastatin 20 MG tablet Commonly known as: ZOCOR       TAKE these medications    aspirin EC 81 MG tablet Take 1 tablet (81 mg total) by mouth in the morning for 21 days.  Swallow whole. Stop after 08/19/22 What changed: additional instructions   atorvastatin 80 MG tablet Commonly known as: LIPITOR Take 1 tablet (80 mg total) by mouth daily. Start taking on: August 01, 2022   CALCIUM + D3 PO Take 1 tablet by mouth daily.   Centrum Silver 50+Men Tabs Take 1 tablet by mouth daily.   clopidogrel 75 MG tablet Commonly known as: PLAVIX Take 1 tablet (75 mg total) by mouth daily. Start taking on: August 01, 2022   FISH OIL PO Take 1 capsule by mouth daily.   levothyroxine 50 MCG tablet Commonly known as: SYNTHROID Take 50 mcg by mouth daily before breakfast. What changed: Another medication with the same name was removed. Continue taking this medication, and follow the directions you see here.   losartan 25 MG tablet Commonly known as: COZAAR Take 25 mg by mouth daily.        Follow-up Information     Maczis, Carlena Hurl, PA-C Follow up on 08/09/2022.   Specialty: General Surgery Why: 3:45 pm, Arrive 30 minutes prior to your appointment time, Please bring your insurance card and photo ID Contact information: [address] [facility] [place] [address] [phone]         [facility] Follow up in 2 week(s).   Why: Please call to schedule an appointment to follow up for your urinary symptoms Contact information: [place] [phone]               No Known Allergies  The results of significant diagnostics from this hospitalization (including imaging, microbiology, ancillary and laboratory) are listed below for reference.    Microbiology: Recent Results (from the past 240 hour(s))  Culture, blood (Routine X 2) w Reflex to ID Panel     Status: None   Collection Time: 07/25/22 12:36 PM   Specimen: BLOOD RIGHT ARM  Result Value Ref Range Status   Specimen Description   Final    BLOOD RIGHT ARM Performed at [facility], [place] [address] [place], [place] [address]    Special Requests   Final    BOTTLES DRAWN AEROBIC ONLY Blood Culture results may not be optimal due to an inadequate volume of blood received in culture bottles Performed at [place] [address], [place], [place] [address]    Culture   Final    NO GROWTH 5 DAYS Performed at [facility], [place] [address] [place], [place] [address]    Report Status 07/30/2022 FINAL  Final  Culture, blood (Routine X 2) w Reflex to ID Panel     Status: None   Collection Time: 07/25/22 12:36 PM   Specimen: BLOOD RIGHT HAND  Result Value Ref Range Status   Specimen Description   Final    BLOOD RIGHT HAND Performed at [place] [address], [place], [place] [address]    Special Requests   Final    BOTTLES DRAWN AEROBIC ONLY Blood Culture results may not be optimal due  to an inadequate volume of blood received in culture bottles Performed at [place] [address], [place], [place] [address]    Culture   Final    NO GROWTH 5 DAYS Performed at [facility], [place] [address], [place],  [address]    Report Status 07/30/2022 FINAL  Final    Procedures/Studies: DG Swallowing Func-Speech Pathology  Result Date: 07/28/2022 Table formatting from the original result was not included. Objective Swallowing Evaluation: Type of Study: MBS-Modified Barium Swallow Study  Patient Details Name: [name] MRN: [id number] Date of Birth: [birth year] Today's Date: 07/28/2022 Time: SLP Start Time (ACUTE ONLY): 0745 -SLP Stop Time (ACUTE ONLY): 0818 SLP Time Calculation (min) (ACUTE ONLY): 33 min Past Medical History: Past Medical History: Diagnosis Date  Chronic kidney disease   left nephrolithiasis  Hematuria   ureteral stone and stent  Hyperlipidemia   Hypertension   Right ureteral stone   Subdural hematoma (HCC) 02/12/2015 Past Surgical History: Past Surgical History: Procedure Laterality Date  CATARACT EXTRACTION W/ INTRAOCULAR LENS  IMPLANT, BILATERAL    CRANIOTOMY Right 02/12/2015  Procedure: [name] for HEMATOMA EVACUATION SUBDURAL;  Surgeon: Hilda Lias, MD;  Location: [facility];  Service: Neurosurgery;  Laterality: Right;  CYSTOSCOPY W/ URETERAL STENT PLACEMENT  01-02-11  right  CYSTOSCOPY WITH URETEROSCOPY  09/17/2011  Procedure: CYSTOSCOPY WITH URETEROSCOPY;  Surgeon: Milford Cage, MD;  Location: [facility];  Service: Urology;  Laterality: Right;  CYSTOSCOPY, RIGHT URETEROSCOPY WITH LASER LITHO AND STENT   EXTRACORPOREAL SHOCK WAVE LITHOTRIPSY  02-05-11  right  EXTRACORPOREAL SHOCK WAVE LITHOTRIPSY Right 05/12/2020  Procedure: EXTRACORPOREAL SHOCK WAVE LITHOTRIPSY (ESWL);  Surgeon: Alfredo Martinez, MD;  Location: [facility];  Service: Urology;  Laterality: Right;  LAPAROSCOPIC APPENDECTOMY N/A  07/20/2022  Procedure: APPENDECTOMY LAPAROSCOPIC;  Surgeon: Darnell Level, MD;  Location: [facility];  Service: General;  Laterality: N/A; HPI: 87 yo male adm to [facility] with abdomen pain - found to have acute gangrenous appendicitis.  He is s/p appendectomy and was tolerating soft/thin diet per notes.  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 Swallow evaluation ordered as pt failed [facility] stroke swallow screen.  Pt has h/o having undergone MBS .  Moderate esophageal dysmotility, with a presbyesophagus pattern per prior esophagram.  Subjective: pt awake in chair, pt's brother present *he had spent the night*  Recommendations for follow up therapy are one component of a multi-disciplinary discharge planning process, led by the attending physician.  Recommendations may be updated based on patient status, additional functional criteria and insurance authorization. Assessment / Plan / Recommendation   07/28/2022   8:38 AM Clinical Impressions Clinical Impression Patient's brother was present t/o MBS and assisted with interpretation for pt.  Pt presents with moderately severe oropharyngeal dysphagia with sensorimotor deficits due to his CVA.  He demonstrates weak lingual contraction/control resulting in nearly absent "mastication", prolonged oral transiting and weak propulsion.  Oral retention of secretions mixed with barium observed post-swallow.  Pharyngeal swallow marked by inconsistently impaired tongue base retraction resulting in vallecular retention.  Partial chin tuck posture and attempt at head turn right *difficult for pt to perform - (suspect language barrier contributed)- did not improve pharyngeal clearance.  Trace aspiration of thin with sequential swallows noted due to impaired prolonged laryngeal closure.  Pt did not sense trace aspiration nor vallecular retention and cued cough/expectoration attempt was produced but was very weak and ineffective to clear.  Very poor "mastication" of  cracker noted with gross retention in valleculae elevating pt.'s aspiration risk with solids.  Recommend dys1/extra gravies/sauces and thin with strict precautions.  Oral suction after meals due to oral retention of secretions.  Will follow for skilled dysphagia management.  During testing, SLP educated pt and his brother to findings/recommendations using video monitor for feedback.  Advised him that if he is coughing,  suspect highly he is aspirating and that it will be prudent to use caution - taking small single sips. SLP Visit Diagnosis Dysphagia, oropharyngeal phase (R13.12) Impact on safety and function Moderate aspiration risk;Risk for inadequate nutrition/hydration     07/28/2022   8:38 AM Treatment Recommendations Treatment Recommendations Therapy as outlined in treatment plan below     07/28/2022   8:47 AM Prognosis Prognosis for Safe Diet Advancement Good   07/28/2022   8:38 AM Diet Recommendations SLP Diet Recommendations Dysphagia 1 (Puree) solids;Thin liquid Liquid Administration via Cup;Straw Medication Administration Crushed with puree Compensations Slow rate;Small sips/bites;Other (Comment) Postural Changes Seated upright at 90 degrees;Remain semi-upright after after feeds/meals (Comment)     07/28/2022   8:38 AM Other Recommendations Oral Care Recommendations Oral care BID Follow Up Recommendations Home health SLP Assistance recommended at discharge Frequent or constant Supervision/Assistance Functional Status Assessment Patient has had a recent decline in their functional status and demonstrates the ability to make significant improvements in function in a reasonable and predictable amount of time.   07/28/2022   8:38 AM Frequency and Duration  Speech Therapy Frequency (ACUTE ONLY) min 1 x/week Treatment Duration 1 week     07/28/2022   8:25 AM Oral Phase Oral Phase Impaired Oral - Nectar Teaspoon Decreased bolus cohesion;Delayed oral transit;Weak lingual manipulation Oral - Nectar Cup Decreased bolus  cohesion;Right anterior bolus loss;Delayed oral transit;Weak lingual manipulation Oral - Nectar Straw Decreased bolus cohesion;Right anterior bolus loss;Premature spillage;Delayed oral transit;Weak lingual manipulation Oral - Thin Teaspoon Decreased bolus cohesion;Delayed oral transit;Right anterior bolus loss;Premature spillage;Weak lingual manipulation Oral - Thin Cup Decreased bolus cohesion;Premature spillage;Delayed oral transit;Weak lingual manipulation Oral - Thin Straw Decreased bolus cohesion;Delayed oral transit;Premature spillage Oral - Puree Decreased bolus cohesion;Delayed oral transit;Premature spillage;Weak lingual manipulation;Lingual pumping;Lingual/palatal residue Oral - Mech Soft Decreased bolus cohesion;Impaired mastication;Delayed oral transit;Premature spillage;Weak lingual manipulation Oral Phase - Comment head turn to the right was not performed adequately and did not prevent retention; slight chin tuck posture with nectar furhter compromised ability for pt to propel boluses into phayrnx and did not contribute to decreased vallecular retention    07/28/2022   8:26 AM Pharyngeal Phase Pharyngeal Phase Impaired Pharyngeal- Nectar Teaspoon Pharyngeal residue - valleculae;Reduced tongue base retraction Pharyngeal Material does not enter airway Pharyngeal- Nectar Cup Reduced epiglottic inversion;Pharyngeal residue - valleculae;Reduced tongue base retraction Pharyngeal Material does not enter airway Pharyngeal- Nectar Straw Pharyngeal residue - valleculae;Reduced tongue base retraction Pharyngeal Material does not enter airway Pharyngeal- Thin Teaspoon Reduced tongue base retraction;Reduced epiglottic inversion;Pharyngeal residue - valleculae Pharyngeal- Thin Cup Reduced airway/laryngeal closure;Reduced laryngeal elevation;Penetration/Apiration after swallow Pharyngeal Material enters airway, passes BELOW cords without attempt by patient to eject out (silent aspiration) Pharyngeal- Mechanical Soft  Reduced epiglottic inversion;Reduced tongue base retraction;Pharyngeal residue - valleculae Pharyngeal Comment head turn to the right was not performed adequately and did not prevent retention; slight chin tuck posture with nectar furhter compromised ability for pt to propel boluses into phayrnx and did not contribute to decreased vallecular retention; significantly weak tongue base retraction/pharyngeal swallow with cracker bolus allowed gross retention with initial swallow    07/28/2022   8:37 AM Cervical Esophageal Phase  Cervical Esophageal Phase Impaired Cervical Esophageal Comment Pt has known h/o esophageal dysmotility per prior esophagram; Upon esophageal sweep, pt appeared with minimal barium retention that mixed with secretions Rolena Infante, MS [facility] SLP Acute Rehab Services Office [phone] Pager [phone] Chales Abrahams 07/28/2022, 8:53 AM  CT ANGIO HEAD NECK W WO CM  Result Date: 07/27/2022 CLINICAL DATA:  Acute neurologic deficit EXAM: CT ANGIOGRAPHY HEAD AND NECK TECHNIQUE: Multidetector CT imaging of the head and neck was performed using the standard protocol during bolus administration of intravenous contrast. Multiplanar CT image reconstructions and MIPs were obtained to evaluate the vascular anatomy. Carotid stenosis measurements (when applicable) are obtained utilizing NASCET criteria, using the distal internal carotid diameter as the denominator. RADIATION DOSE REDUCTION: This exam was performed according to the departmental dose-optimization program which includes automated exposure control, adjustment of the mA and/or kV according to patient size and/or use of iterative reconstruction technique. CONTRAST:  9mL OMNIPAQUE IOHEXOL 350 MG/ML SOLN COMPARISON:  None Available. FINDINGS: CT HEAD FINDINGS Brain: There is no mass, hemorrhage or extra-axial collection. There is generalized atrophy without lobar predilection. There is hypoattenuation of the periventricular white  matter, most commonly indicating chronic ischemic microangiopathy. Skull: The visualized skull base, calvarium and extracranial soft tissues are normal. Sinuses/Orbits: No fluid levels or advanced mucosal thickening of the visualized paranasal sinuses. No mastoid or middle ear effusion. The orbits are normal. CTA NECK FINDINGS SKELETON: There is no bony spinal canal stenosis. No lytic or blastic lesion. OTHER NECK: Normal pharynx, larynx and major salivary glands. No cervical lymphadenopathy. Unremarkable thyroid gland. UPPER CHEST: No pneumothorax or pleural effusion. No nodules or masses. AORTIC ARCH: There is calcific atherosclerosis of the aortic arch. There is no aneurysm, dissection or hemodynamically significant stenosis of the visualized portion of the aorta. Conventional 3 vessel aortic branching pattern. The visualized proximal subclavian arteries are widely patent. RIGHT CAROTID SYSTEM: No dissection, occlusion or aneurysm. Mild atherosclerotic calcification at the carotid bifurcation without hemodynamically significant stenosis. LEFT CAROTID SYSTEM: No dissection, occlusion or aneurysm. Mild atherosclerotic calcification at the carotid bifurcation without hemodynamically significant stenosis. VERTEBRAL ARTERIES: Left dominant configuration. Both origins are clearly patent. There is no dissection, occlusion or flow-limiting stenosis to the skull base (V1-V3 segments). CTA HEAD FINDINGS POSTERIOR CIRCULATION: --Vertebral arteries: Normal V4 segments. --Inferior cerebellar arteries: Normal. --Basilar artery: Normal. --Superior cerebellar arteries: Normal. --Posterior cerebral arteries (PCA): Normal. ANTERIOR CIRCULATION: --Intracranial internal carotid arteries: Atherosclerotic calcification of the internal carotid arteries at the skull base without hemodynamically significant stenosis. --Anterior cerebral arteries (ACA): Normal. Both A1 segments are present. Patent anterior communicating artery (a-comm).  --Middle cerebral arteries (MCA): Normal. VENOUS SINUSES: As permitted by contrast timing, patent. ANATOMIC VARIANTS: None Review of the MIP images confirms the above findings. IMPRESSION: 1. No emergent large vessel occlusion or hemodynamically significant stenosis of the head or neck. 2. Aortic Atherosclerosis (ICD10-I70.0). Electronically Signed   By: Deatra Robinson M.D.   On: 07/27/2022 00:33   MR BRAIN WO CONTRAST  Result Date: 07/26/2022 CLINICAL DATA:  Altered mental status.  Evaluate for stroke. EXAM: MRI HEAD WITHOUT CONTRAST TECHNIQUE: Multiplanar, multiecho pulse sequences of the brain and surrounding structures were obtained without intravenous contrast. COMPARISON:  Brain MRI 3 days prior and same-day head CT FINDINGS: Brain: There are numerous small acute infarcts in the left lentiform nucleus which are new since the study from 3 days prior without hemorrhage or mass effect. There is no other evidence of acute infarct. There is no acute intracranial hemorrhage or extra-axial fluid collection. Background parenchymal volume loss with prominence of the ventricular system and extra-axial CSF spaces is unchanged the ventricles are stable in size. Confluent FLAIR signal abnormality in the supratentorial brain likely reflecting chronic small vessel ischemic change is stable. A remote infarct in the  right external capsule and scattered punctate chronic microhemorrhages are stable. There is no mass lesion.  There is no mass effect or midline shift. Vascular: Normal flow voids. Skull and upper cervical spine: Normal marrow signal. Sinuses/Orbits: The paranasal sinuses are clear. Bilateral lens implants are in place. The globes and orbits are otherwise unremarkable. Other: None. IMPRESSION: Small acute infarcts in the left lentiform nucleus without hemorrhage or mass effect are new since 07/23/2022. Electronically Signed   By: Lesia Hausen M.D.   On: 07/26/2022 16:37   CT HEAD WO CONTRAST ( )  Result  Date: 07/26/2022 CLINICAL DATA:  Mental status change, unknown cause. EXAM: CT HEAD WITHOUT CONTRAST TECHNIQUE: Contiguous axial images were obtained from the base of the skull through the vertex without intravenous contrast. RADIATION DOSE REDUCTION: This exam was performed according to the departmental dose-optimization program which includes automated exposure control, adjustment of the mA and/or kV according to patient size and/or use of iterative reconstruction technique. COMPARISON:  CT head 07/20/2022.  MRI head 07/23/2022. FINDINGS: Brain: There is no evidence of acute intracranial hemorrhage, mass lesion, brain edema or extra-axial fluid collection. Diffuse atrophy with prominence of the ventricles and subarachnoid spaces. There are chronic small vessel ischemic changes in the periventricular white matter which are unchanged from the recent prior studies. Dense basal ganglia calcifications are present bilaterally. There is no CT evidence of acute cortical infarction. Vascular: Prominent intracranial vascular calcifications. No hyperdense vessel identified. Skull: Right frontal parietal burr holes are again noted. No acute calvarial findings. Sinuses/Orbits: The visualized paranasal sinuses and mastoid air cells are clear. No orbital abnormalities are seen. Other: None. IMPRESSION: Stable examination without evidence of acute intracranial findings. Stable atrophy and chronic small vessel ischemic changes. Electronically Signed   By: Carey Bullocks M.D.   On: 07/26/2022 11:24   DG CHEST PORT 1 VIEW  Result Date: 07/24/2022 CLINICAL DATA:  Follow-up exam. EXAM: PORTABLE CHEST 1 VIEW COMPARISON:  07/21/2022 FINDINGS: Heart size and mediastinal contours are stable. Unchanged elevation of the right hemidiaphragm. Lung volumes are improved from the previous exam. Mild pulmonary vascular congestion. No airspace opacities. IMPRESSION: 1. Improved aeration to the lungs compared with previous exam. 2. Mild  pulmonary vascular congestion. Electronically Signed   By: Signa Kell M.D.   On: 07/24/2022 10:56   MR BRAIN WO CONTRAST  Result Date: 07/23/2022 CLINICAL DATA:  Altered mental status EXAM: MRI HEAD WITHOUT CONTRAST TECHNIQUE: Multiplanar, multiecho pulse sequences of the brain and surrounding structures were obtained without intravenous contrast. COMPARISON:  02/12/2015 FINDINGS: Brain: No acute infarct, mass effect or extra-axial collection. Multifocal central predominant chronic microhemorrhage. There is confluent hyperintense T2-weighted signal within the white matter. There is advanced atrophy. The midline structures are normal. Vascular: Major flow voids are preserved. Skull and upper cervical spine: Normal calvarium and skull base. Visualized upper cervical spine and soft tissues are normal. Sinuses/Orbits:No paranasal sinus fluid levels or advanced mucosal thickening. No mastoid or middle ear effusion. Normal orbits. IMPRESSION: 1. No acute intracranial abnormality. 2. Advanced atrophy and chronic microvascular ischemic disease. Electronically Signed   By: Deatra Robinson M.D.   On: 07/23/2022 19:41   DG CHEST PORT 1 VIEW  Result Date: 07/21/2022 CLINICAL DATA:  Altered mental status and urinary frequency. Laparoscopic appendectomy 07/20/2022. EXAM: PORTABLE CHEST 1 VIEW COMPARISON:  07/20/2022 FINDINGS: Lungs are hypoinflated with mild hazy prominence of the pulmonary vasculature suggesting mild vascular congestion. No lobar consolidation or effusion. Mild stable cardiomegaly. Remainder of the exam is unchanged. IMPRESSION:  Mild stable cardiomegaly with suggestion of mild vascular congestion. Electronically Signed   By: Elberta Fortis M.D.   On: 07/21/2022 10:02   ECHOCARDIOGRAM COMPLETE  Result Date: 07/21/2022    ECHOCARDIOGRAM REPORT   Patient Name:   [name] Date of Exam: 07/21/2022 Medical Rec #:  [id number]       Height:       63.0 in Accession #:    [id number]      Weight:       161.6  lb Date of Birth:  06/27/1935       BSA:          1.766 m Patient Age:    87 years        BP:           97/64 mmHg Patient Gender: M               HR:           102 bpm. Exam Location:  Inpatient Procedure: 2D Echo, Cardiac Doppler, Color Doppler and Intracardiac            Opacification Agent Indications:    Dyspnea R06.00  History:        Patient has no prior history of Echocardiogram examinations.                 Risk Factors:Hypertension and Dyslipidemia. Acute hypoxic                 respiratory failure. Acute encephalopathy. Sepsis secondary to                 acute appendicitis s/p appendectomy. Hypothyroidism.  Sonographer:    Leta Jungling RDCS Referring Phys: [id number] TIMOTHY S OPYD  Sonographer Comments: Suboptimal apical window and suboptimal parasternal window. IMPRESSIONS  1. Left ventricular ejection fraction, by estimation, is 65 to 70%. The left ventricle has normal function. The left ventricle has no regional wall motion abnormalities. Left ventricular diastolic function could not be evaluated.  2. Right ventricular systolic function is normal. The right ventricular size is normal.  3. The mitral valve is normal in structure. No evidence of mitral valve regurgitation. No evidence of mitral stenosis.  4. The aortic valve is tricuspid. Aortic valve regurgitation is not visualized. Aortic valve sclerosis/calcification is present, without any evidence of aortic stenosis.  5. Aortic dilatation noted. There is mild dilatation of the aortic root, measuring 39 mm.  6. The inferior vena cava is normal in size with greater than 50% respiratory variability, suggesting right atrial pressure of 3 mmHg. FINDINGS  Left Ventricle: Left ventricular ejection fraction, by estimation, is 65 to 70%. The left ventricle has normal function. The left ventricle has no regional wall motion abnormalities. Definity contrast agent was given IV to delineate the left ventricular  endocardial borders. The left ventricular  internal cavity size was normal in size. There is no left ventricular hypertrophy. Left ventricular diastolic function could not be evaluated. Right Ventricle: The right ventricular size is normal. No increase in right ventricular wall thickness. Right ventricular systolic function is normal. Left Atrium: Left atrial size was normal in size. Right Atrium: Right atrial size was normal in size. Pericardium: There is no evidence of pericardial effusion. Mitral Valve: The mitral valve is normal in structure. No evidence of mitral valve regurgitation. No evidence of mitral valve stenosis. Tricuspid Valve: The tricuspid valve is normal in structure. Tricuspid valve regurgitation is not demonstrated. No evidence of tricuspid stenosis. Aortic Valve: The  aortic valve is tricuspid. Aortic valve regurgitation is not visualized. Aortic valve sclerosis/calcification is present, without any evidence of aortic stenosis. Pulmonic Valve: The pulmonic valve was normal in structure. Pulmonic valve regurgitation is not visualized. No evidence of pulmonic stenosis. Aorta: Aortic dilatation noted. There is mild dilatation of the aortic root, measuring 39 mm. Venous: The inferior vena cava is normal in size with greater than 50% respiratory variability, suggesting right atrial pressure of 3 mmHg. IAS/Shunts: No atrial level shunt detected by color flow Doppler.  LEFT VENTRICLE PLAX 2D LVIDd:         4.30 cm LVIDs:         3.20 cm LV PW:         1.00 cm LV IVS:        1.10 cm LVOT diam:     2.20 cm LV SV:         48 LV SV Index:   27 LVOT Area:     3.80 cm  LV Volumes (MOD) LV vol d, MOD A2C: 47.0 ml LV vol d, MOD A4C: 59.3 ml LV vol s, MOD A2C: 14.3 ml LV vol s, MOD A4C: 19.9 ml LV SV MOD A2C:     32.7 ml LV SV MOD A4C:     59.3 ml LV SV MOD BP:      37.0 ml LEFT ATRIUM             Index LA diam:        2.80 cm 1.59 cm/m LA Vol (A2C):   21.9 ml 12.40 ml/m LA Vol (A4C):   28.7 ml 16.25 ml/m LA Biplane Vol: 25.9 ml 14.66 ml/m  AORTIC  VALVE LVOT Vmax:   73.80 cm/s LVOT Vmean:  48.300 cm/s LVOT VTI:    0.126 m  AORTA Ao Root diam: 3.90 cm Ao Asc diam:  3.50 cm  SHUNTS Systemic VTI:  0.13 m Systemic Diam: 2.20 cm Armanda Magic MD Electronically signed by Armanda Magic MD Signature Date/Time: 07/21/2022/9:03:12 AM    Final    X-ray chest PA or AP  Result Date: 07/20/2022 CLINICAL DATA:  Shortness of breath following appendectomy EXAM: CHEST  1 VIEW COMPARISON:  07/20/2022 at 0931 hours FINDINGS: Stable cardiomediastinal contours. Low lung volumes. Increasing interstitial opacities throughout both lungs. No large pleural fluid collection. No pneumothorax. IMPRESSION: Increasing interstitial opacities throughout both lungs may reflect pulmonary edema. Electronically Signed   By: Duanne Guess D.O.   On: 07/20/2022 18:35   CT Abdomen Pelvis W Contrast  Result Date: 07/20/2022 CLINICAL DATA:  Right lower quadrant pain EXAM: CT ABDOMEN AND PELVIS WITH CONTRAST TECHNIQUE: Multidetector CT imaging of the abdomen and pelvis was performed using the standard protocol following bolus administration of intravenous contrast. RADIATION DOSE REDUCTION: This exam was performed according to the departmental dose-optimization program which includes automated exposure control, adjustment of the mA and/or kV according to patient size and/or use of iterative reconstruction technique. CONTRAST:  OMNIPAQUE IOHEXOL 300 MG/ML  SOLN COMPARISON:  08/26/2020 FINDINGS: Lower chest: Tortuosity of the descending thoracic aorta. Coronary artery atherosclerosis. Hepatobiliary: Mildly decreased attenuation of the hepatic parenchyma. No focal liver lesion is identified. Unremarkable gallbladder. No hyperdense gallstone. No biliary dilatation. Pancreas: Unremarkable. No pancreatic ductal dilatation or surrounding inflammatory changes. Spleen: Normal in size without focal abnormality. Adrenals/Urinary Tract: Adrenal glands are unremarkable. Kidneys are normal, without renal  calculi, solid lesion, or hydronephrosis. Mild urinary bladder wall thickening. The visualized urethra is moderately distended with fluid along  its full length with urothelial enhancement (series 6, image 75). Stomach/Bowel: The appendix is abnormally dilated and hyperenhancing with surrounding periappendiceal fat stranding and trace free fluid. No organized periappendiceal fluid collection. No extraluminal air. No dilated loops of bowel to suggest obstruction. Stomach within normal limits. No additional sites of focal bowel inflammation. Vascular/Lymphatic: Scattered aortoiliac atherosclerotic calcifications without aneurysm. No abdominopelvic lymphadenopathy. Reproductive: Prostate is unremarkable. Other: No ascites. No organized abdominopelvic fluid collection. No pneumoperitoneum. No abdominal wall hernia. 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 No new or acute bony findings. IMPRESSION: 1. Acute uncomplicated appendicitis. 2. Mild urinary bladder wall thickening. The visualized urethra is moderately distended with diffuse urothelial enhancement. Findings suspicious for urethritis and cystitis. Correlate with urinalysis. The possibility of a distal urethral stricture is also a consideration. 3. Hepatic steatosis. 4. Aortic and coronary artery atherosclerosis (ICD10-I70.0). Electronically Signed   By: Duanne GuessNicholas  Plundo D.O.   On: 07/20/2022 11:02   CT Head Wo Contrast  Result Date: 07/20/2022 CLINICAL DATA:  Status post fall. EXAM: CT HEAD WITHOUT CONTRAST CT CERVICAL SPINE WITHOUT CONTRAST TECHNIQUE: Multidetector CT imaging of the head and cervical spine was performed following the standard protocol without intravenous contrast. Multiplanar CT image reconstructions of the cervical spine were also generated. RADIATION DOSE REDUCTION: This exam was performed according to the departmental dose-optimization program which includes automated  exposure control, adjustment of the mA and/or kV according to patient size and/or use of iterative reconstruction technique. COMPARISON:  02/15/2015 head CT. FINDINGS: CT HEAD FINDINGS Brain: There is no evidence for acute hemorrhage, hydrocephalus, mass lesion, or abnormal extra-axial fluid collection. No definite CT evidence for acute infarction. Diffuse loss of parenchymal volume is consistent with atrophy. Patchy low attenuation in the deep hemispheric and periventricular white matter is nonspecific, but likely reflects chronic microvascular ischemic demyelination. Vascular: No hyperdense vessel or unexpected calcification. Skull: No evidence for fracture. No worrisome lytic or sclerotic lesion. Right-sided burr holes evident. Sinuses/Orbits: The visualized paranasal sinuses and mastoid air cells are clear. Visualized portions of the globes and intraorbital fat are unremarkable. Other: None. CT CERVICAL SPINE FINDINGS Alignment: Trace anterolisthesis of C4 on 5 and C5 on 6 is compatible with a degree of facet disease at those levels. No evidence for traumatic subluxation. Skull base and vertebrae: No acute fracture. No primary bone lesion or focal pathologic process. Soft tissues and spinal canal: No prevertebral fluid or swelling. No visible canal hematoma. Disc levels: Mild loss of disc height noted C6-7 and C7-T1. Diffuse facet osteoarthritis noted bilaterally. Upper chest: Centrilobular emphysema. Other: None. IMPRESSION: 1. No acute intracranial abnormality. 2. Atrophy with chronic small vessel white matter ischemic disease. 3. No evidence for cervical spine fracture or traumatic subluxation. 4. Degenerative changes in the cervical spine as above. Electronically Signed   By: Kennith CenterEric  Mansell M.D.   On: 07/20/2022 10:52   CT Cervical Spine Wo Contrast  Result Date: 07/20/2022 CLINICAL DATA:  Status post fall. EXAM: CT HEAD WITHOUT CONTRAST CT CERVICAL SPINE WITHOUT CONTRAST TECHNIQUE: Multidetector CT  imaging of the head and cervical spine was performed following the standard protocol without intravenous contrast. Multiplanar CT image reconstructions of the cervical spine were also generated. RADIATION DOSE REDUCTION: This exam was performed according to the departmental dose-optimization program which includes automated exposure control, adjustment of the mA and/or kV according to patient size and/or use of iterative reconstruction technique. COMPARISON:  02/15/2015 head CT. FINDINGS: CT HEAD FINDINGS Brain: There is  no evidence for acute hemorrhage, hydrocephalus, mass lesion, or abnormal extra-axial fluid collection. No definite CT evidence for acute infarction. Diffuse loss of parenchymal volume is consistent with atrophy. Patchy low attenuation in the deep hemispheric and periventricular white matter is nonspecific, but likely reflects chronic microvascular ischemic demyelination. Vascular: No hyperdense vessel or unexpected calcification. Skull: No evidence for fracture. No worrisome lytic or sclerotic lesion. Right-sided burr holes evident. Sinuses/Orbits: The visualized paranasal sinuses and mastoid air cells are clear. Visualized portions of the globes and intraorbital fat are unremarkable. Other: None. CT CERVICAL SPINE FINDINGS Alignment: Trace anterolisthesis of C4 on 5 and C5 on 6 is compatible with a degree of facet disease at those levels. No evidence for traumatic subluxation. Skull base and vertebrae: No acute fracture. No primary bone lesion or focal pathologic process. Soft tissues and spinal canal: No prevertebral fluid or swelling. No visible canal hematoma. Disc levels: Mild loss of disc height noted C6-7 and C7-T1. Diffuse facet osteoarthritis noted bilaterally. Upper chest: Centrilobular emphysema. Other: None. IMPRESSION: 1. No acute intracranial abnormality. 2. Atrophy with chronic small vessel white matter ischemic disease. 3. No evidence for cervical spine fracture or traumatic  subluxation. 4. Degenerative changes in the cervical spine as above. Electronically Signed   By: Kennith Center M.D.   On: 07/20/2022 10:52   DG Chest 2 View  Result Date: 07/20/2022 CLINICAL DATA:  Suspected sepsis EXAM: CHEST - 2 VIEW COMPARISON:  Chest x-ray dated December 24, 2010 FINDINGS: Cardiac and mediastinal contours are within normal limits for technique. Low lung volumes with hypoventilatory changes. No definite focal consolidation. No large pleural effusion or pneumothorax. IMPRESSION: Low lung volumes with hypoventilatory changes. No definite focal consolidation. Electronically Signed   By: Allegra Lai M.D.   On: 07/20/2022 09:45    Labs: BNP (last 3 results) Recent Labs    07/20/22 2038  BNP 195.5*   Basic Metabolic Panel: Recent Labs  Lab 07/27/22 0810 07/28/22 0721  NA 142 145  K 3.8 3.6  CL 104 106  CO2 29 30  GLUCOSE 158* 152*  BUN 21 31*  CREATININE 0.85 0.94  CALCIUM 9.5 9.5   Liver Function Tests: No results for input(s): "AST", "ALT", "ALKPHOS", "BILITOT", "PROT", "ALBUMIN" in the last 168 hours. No results for input(s): "LIPASE", "AMYLASE" in the last 168 hours. No results for input(s): "AMMONIA" in the last 168 hours. CBC: Recent Labs  Lab 07/27/22 0810 07/28/22 0721  WBC 10.1 6.9  HGB 15.2 14.7  HCT 44.7 44.9  MCV 96.3 98.0  PLT 289 280   Cardiac Enzymes: No results for input(s): "CKTOTAL", "CKMB", "CKMBINDEX", "TROPONINI" in the last 168 hours. BNP: Invalid input(s): "POCBNP" CBG: No results for input(s): "GLUCAP" in the last 168 hours. D-Dimer No results for input(s): "DDIMER" in the last 72 hours. Hgb A1c No results for input(s): "HGBA1C" in the last 72 hours. Lipid Profile No results for input(s): "CHOL", "HDL", "LDLCALC", "TRIG", "CHOLHDL", "LDLDIRECT" in the last 72 hours. Thyroid function studies No results for input(s): "TSH", "T4TOTAL", "T3FREE", "THYROIDAB" in the last 72 hours.  Invalid input(s): "FREET3" Anemia work  up No results for input(s): "VITAMINB12", "FOLATE", "FERRITIN", "TIBC", "IRON", "RETICCTPCT" in the last 72 hours. Urinalysis    Component Value Date/Time   COLORURINE YELLOW 07/20/2022 0854   APPEARANCEUR CLEAR 07/20/2022 0854   LABSPEC 1.010 07/20/2022 0854   PHURINE 6.0 07/20/2022 0854   GLUCOSEU NEGATIVE 07/20/2022 0854   HGBUR NEGATIVE 07/20/2022 0854   BILIRUBINUR NEGATIVE 07/20/2022 4098  KETONESUR 20 (A) 07/20/2022 0854   PROTEINUR 30 (A) 07/20/2022 0854   UROBILINOGEN 0.2 02/12/2015 1804   NITRITE NEGATIVE 07/20/2022 0854   LEUKOCYTESUR NEGATIVE 07/20/2022 0854   Sepsis Labs Recent Labs  Lab 07/27/22 0810 07/28/22 0721  WBC 10.1 6.9   Microbiology Recent Results (from the past 240 hour(s))  Culture, blood (Routine X 2) w Reflex to ID Panel     Status: None   Collection Time: 07/25/22 12:36 PM   Specimen: BLOOD RIGHT ARM  Result Value Ref Range Status   Specimen Description   Final    BLOOD RIGHT ARM Performed at [facility], [address] [place], [place] [address]    Special Requests   Final    BOTTLES DRAWN AEROBIC ONLY Blood Culture results may not be optimal due to an inadequate volume of blood received in culture bottles Performed at [facility], [address], [place], [place] [address]    Culture   Final    NO GROWTH 5 DAYS Performed at [facility], [address] [place], [place] [address]    Report Status 07/30/2022 FINAL  Final  Culture, blood (Routine X 2) w Reflex to ID Panel     Status: None   Collection Time: 07/25/22 12:36 PM   Specimen: BLOOD RIGHT HAND  Result Value Ref Range Status   Specimen Description   Final    BLOOD RIGHT HAND Performed at [facility], [address], [place], [place] [address]    Special Requests   Final    BOTTLES DRAWN AEROBIC ONLY Blood Culture results may not be optimal due to an inadequate volume of blood received in culture  bottles Performed at [facility], [address], [place], [place] [address]    Culture   Final    NO GROWTH 5 DAYS Performed at [facility], [address], [place], [place] [address]    Report Status 07/30/2022 FINAL  Final     Time coordinating discharge: 25 minutes  SIGNED: Lanae Boast, MD  [facility] 07/31/2022, 10:36 AM  If 7PM-7AM, please contact night-coverage [email or web address]

## 2022-07-31 NOTE — Progress Notes (Signed)
Report called to [place] at [facility].
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
## 2022-08-01 DIAGNOSIS — J810 Acute pulmonary edema: Secondary | ICD-10-CM | POA: Diagnosis not present

## 2022-08-01 DIAGNOSIS — R270 Ataxia, unspecified: Secondary | ICD-10-CM | POA: Diagnosis not present

## 2022-08-01 DIAGNOSIS — I10 Essential (primary) hypertension: Secondary | ICD-10-CM | POA: Diagnosis not present

## 2022-08-01 DIAGNOSIS — I69322 Dysarthria following cerebral infarction: Secondary | ICD-10-CM | POA: Diagnosis not present

## 2022-08-01 DIAGNOSIS — E569 Vitamin deficiency, unspecified: Secondary | ICD-10-CM | POA: Diagnosis not present

## 2022-08-01 DIAGNOSIS — E46 Unspecified protein-calorie malnutrition: Secondary | ICD-10-CM | POA: Diagnosis not present

## 2022-08-01 DIAGNOSIS — E039 Hypothyroidism, unspecified: Secondary | ICD-10-CM | POA: Diagnosis not present

## 2022-08-01 DIAGNOSIS — G9341 Metabolic encephalopathy: Secondary | ICD-10-CM | POA: Diagnosis not present

## 2022-08-01 DIAGNOSIS — E785 Hyperlipidemia, unspecified: Secondary | ICD-10-CM | POA: Diagnosis not present

## 2022-08-01 DIAGNOSIS — J9601 Acute respiratory failure with hypoxia: Secondary | ICD-10-CM | POA: Diagnosis not present

## 2022-08-01 DIAGNOSIS — K3532 Acute appendicitis with perforation and localized peritonitis, without abscess: Secondary | ICD-10-CM | POA: Diagnosis not present

## 2022-08-01 DIAGNOSIS — I63412 Cerebral infarction due to embolism of left middle cerebral artery: Secondary | ICD-10-CM | POA: Diagnosis not present

## 2022-08-02 DIAGNOSIS — E569 Vitamin deficiency, unspecified: Secondary | ICD-10-CM | POA: Diagnosis not present

## 2022-08-02 DIAGNOSIS — I10 Essential (primary) hypertension: Secondary | ICD-10-CM | POA: Diagnosis not present

## 2022-08-02 DIAGNOSIS — R270 Ataxia, unspecified: Secondary | ICD-10-CM | POA: Diagnosis not present

## 2022-08-02 DIAGNOSIS — I69322 Dysarthria following cerebral infarction: Secondary | ICD-10-CM | POA: Diagnosis not present

## 2022-08-02 DIAGNOSIS — J9601 Acute respiratory failure with hypoxia: Secondary | ICD-10-CM | POA: Diagnosis not present

## 2022-08-02 DIAGNOSIS — K3532 Acute appendicitis with perforation and localized peritonitis, without abscess: Secondary | ICD-10-CM | POA: Diagnosis not present

## 2022-08-02 DIAGNOSIS — E039 Hypothyroidism, unspecified: Secondary | ICD-10-CM | POA: Diagnosis not present

## 2022-08-02 DIAGNOSIS — J810 Acute pulmonary edema: Secondary | ICD-10-CM | POA: Diagnosis not present

## 2022-08-02 DIAGNOSIS — G9341 Metabolic encephalopathy: Secondary | ICD-10-CM | POA: Diagnosis not present

## 2022-08-02 DIAGNOSIS — E559 Vitamin D deficiency, unspecified: Secondary | ICD-10-CM | POA: Diagnosis not present

## 2022-08-02 DIAGNOSIS — E785 Hyperlipidemia, unspecified: Secondary | ICD-10-CM | POA: Diagnosis not present

## 2022-08-02 DIAGNOSIS — E46 Unspecified protein-calorie malnutrition: Secondary | ICD-10-CM | POA: Diagnosis not present

## 2022-08-02 DIAGNOSIS — I63412 Cerebral infarction due to embolism of left middle cerebral artery: Secondary | ICD-10-CM | POA: Diagnosis not present

## 2022-08-03 DIAGNOSIS — L539 Erythematous condition, unspecified: Secondary | ICD-10-CM | POA: Diagnosis not present

## 2022-08-07 DIAGNOSIS — H5789 Other specified disorders of eye and adnexa: Secondary | ICD-10-CM | POA: Diagnosis not present

## 2022-08-08 DIAGNOSIS — K5909 Other constipation: Secondary | ICD-10-CM | POA: Diagnosis not present

## 2022-08-08 DIAGNOSIS — B370 Candidal stomatitis: Secondary | ICD-10-CM | POA: Diagnosis not present

## 2022-08-09 ENCOUNTER — Other Ambulatory Visit: Payer: Self-pay | Admitting: *Deleted

## 2022-08-09 NOTE — Patient Outreach (Signed)
Mr. Senteno resides in [facility] SNF. Screening for potential [facility] care coordination services as benefit of insurance plan and PCP.   Update received from [place], [place] indicating  Mr. Fulop is max assist with therapy. Is from home with spouse. New medications started for constipation.  Will continue to follow for potential [facility] services.   Marthenia Rolling, MSN, RN,BSN [place] Acute Care Coordinator [phone] (Direct dial)

## 2022-08-14 DIAGNOSIS — K5909 Other constipation: Secondary | ICD-10-CM | POA: Diagnosis not present

## 2022-08-15 DIAGNOSIS — K5901 Slow transit constipation: Secondary | ICD-10-CM | POA: Diagnosis not present

## 2022-08-15 DIAGNOSIS — I10 Essential (primary) hypertension: Secondary | ICD-10-CM | POA: Diagnosis not present

## 2022-08-16 ENCOUNTER — Other Ambulatory Visit: Payer: Self-pay | Admitting: *Deleted

## 2022-08-16 DIAGNOSIS — R1084 Generalized abdominal pain: Secondary | ICD-10-CM | POA: Diagnosis not present

## 2022-08-16 DIAGNOSIS — K5901 Slow transit constipation: Secondary | ICD-10-CM | POA: Diagnosis not present

## 2022-08-16 NOTE — Patient Outreach (Signed)
[place] Coordinator follow up. Mr. [name] resides in [facility] SNF. Screening for care coordination needs as benefit of insurance plan and PCP.   Met with [facility] SN[facility] social worker and therapy manager on 08/15/22. [name] is slated to return home with spouse, son, daughter in law, and grandchildren on 08/18/22. Will have home health.  PC[facility] office [facility] [place] has [facility] care management services. Will send secure notification of SNF discharge.   Marthenia Rolling, MSN, RN,BSN [place] Acute Care Coordinator [phone] (Direct dial)

## 2022-08-17 DIAGNOSIS — I69391 Dysphagia following cerebral infarction: Secondary | ICD-10-CM | POA: Diagnosis not present

## 2022-08-17 DIAGNOSIS — J9601 Acute respiratory failure with hypoxia: Secondary | ICD-10-CM | POA: Diagnosis not present

## 2022-08-17 DIAGNOSIS — I639 Cerebral infarction, unspecified: Secondary | ICD-10-CM | POA: Diagnosis not present

## 2022-08-17 DIAGNOSIS — I63412 Cerebral infarction due to embolism of left middle cerebral artery: Secondary | ICD-10-CM | POA: Diagnosis not present

## 2022-08-17 DIAGNOSIS — R262 Difficulty in walking, not elsewhere classified: Secondary | ICD-10-CM | POA: Diagnosis not present

## 2022-08-21 DIAGNOSIS — N471 Phimosis: Secondary | ICD-10-CM | POA: Diagnosis not present

## 2022-08-21 DIAGNOSIS — Z87442 Personal history of urinary calculi: Secondary | ICD-10-CM | POA: Diagnosis not present

## 2022-08-21 DIAGNOSIS — N481 Balanitis: Secondary | ICD-10-CM | POA: Diagnosis not present

## 2022-08-22 ENCOUNTER — Other Ambulatory Visit: Payer: Self-pay | Admitting: *Deleted

## 2022-08-22 NOTE — Patient Outreach (Signed)
[facility] Post- Acute Care Coordinator follow up. Mr. Innocent remains in [facility] SNF.   Met with therapy manager and SNF SW at [facility].  Family appealed discharge last week and won. Family appealed discharge again today. Transition plan remains to be home with family not LTC. Will have home health and personal caregiver hours thru Medicaid. Mr. Sollenberger continues to require a lot of assistance. Therapy manager reports grandson Het speaks [place]. If discharge decision is upheld, Mr. Hamblin will transition home on Saturday, October 14th.  PC[facility] office [facility] [place] has [facility]. Will plan to send secure notification to [facility] upon SNF discharge.  Will continue to follow.    Marthenia Rolling, MSN, RN,BSN [place] Acute Care Coordinator [phone] (Direct dial)

## 2022-08-27 DIAGNOSIS — R634 Abnormal weight loss: Secondary | ICD-10-CM | POA: Diagnosis not present

## 2022-08-27 DIAGNOSIS — I69391 Dysphagia following cerebral infarction: Secondary | ICD-10-CM | POA: Diagnosis not present

## 2022-08-27 DIAGNOSIS — R0989 Other specified symptoms and signs involving the circulatory and respiratory systems: Secondary | ICD-10-CM | POA: Diagnosis not present

## 2022-08-27 DIAGNOSIS — R051 Acute cough: Secondary | ICD-10-CM | POA: Diagnosis not present

## 2022-08-28 DIAGNOSIS — R051 Acute cough: Secondary | ICD-10-CM | POA: Diagnosis not present

## 2022-08-28 DIAGNOSIS — I509 Heart failure, unspecified: Secondary | ICD-10-CM | POA: Diagnosis not present

## 2022-08-28 DIAGNOSIS — I10 Essential (primary) hypertension: Secondary | ICD-10-CM | POA: Diagnosis not present

## 2022-08-29 ENCOUNTER — Other Ambulatory Visit: Payer: Self-pay | Admitting: *Deleted

## 2022-08-29 DIAGNOSIS — R270 Ataxia, unspecified: Secondary | ICD-10-CM | POA: Diagnosis not present

## 2022-08-29 DIAGNOSIS — I69391 Dysphagia following cerebral infarction: Secondary | ICD-10-CM | POA: Diagnosis not present

## 2022-08-29 DIAGNOSIS — G9341 Metabolic encephalopathy: Secondary | ICD-10-CM | POA: Diagnosis not present

## 2022-08-29 DIAGNOSIS — Z0189 Encounter for other specified special examinations: Secondary | ICD-10-CM | POA: Diagnosis not present

## 2022-08-29 DIAGNOSIS — I10 Essential (primary) hypertension: Secondary | ICD-10-CM | POA: Diagnosis not present

## 2022-08-29 DIAGNOSIS — I63412 Cerebral infarction due to embolism of left middle cerebral artery: Secondary | ICD-10-CM | POA: Diagnosis not present

## 2022-08-29 DIAGNOSIS — I69322 Dysarthria following cerebral infarction: Secondary | ICD-10-CM | POA: Diagnosis not present

## 2022-08-29 NOTE — Patient Outreach (Signed)
[place] Coordinator follow up. Mr. Harriott resides in [facility] SNF.   Update received from [place], [place] indicating Mr. Guidice has been experiencing some medical issues. Not slated for discharge home yet. Will return home with family when appropriate.   Will follow.   [facility] office [facility] [place] has [facility] care management.   Marthenia Rolling, MSN, RN,BSN [place] Acute Care Coordinator [phone] (Direct dial)

## 2022-09-05 ENCOUNTER — Other Ambulatory Visit: Payer: Self-pay | Admitting: *Deleted

## 2022-09-05 NOTE — Patient Outreach (Signed)
[facility] Post- Acute Care Coordinator follow up. [name] resides in [facility] SN[facility].   Facility site visit to [facility]. Met with therapy manager and SNF social workers. [name] will transition to [place] on this Friday, Oct. 27th.   No identifiable [facility] care coordination needs.    Nicholas Rolling, MSN, RN,BSN [place] Acute Care Coordinator [phone] (Direct dial)

## 2022-09-07 DIAGNOSIS — E46 Unspecified protein-calorie malnutrition: Secondary | ICD-10-CM | POA: Diagnosis not present

## 2022-09-07 DIAGNOSIS — I69322 Dysarthria following cerebral infarction: Secondary | ICD-10-CM | POA: Diagnosis not present

## 2022-09-07 DIAGNOSIS — G9341 Metabolic encephalopathy: Secondary | ICD-10-CM | POA: Diagnosis not present

## 2022-09-07 DIAGNOSIS — I69391 Dysphagia following cerebral infarction: Secondary | ICD-10-CM | POA: Diagnosis not present

## 2022-09-07 DIAGNOSIS — I10 Essential (primary) hypertension: Secondary | ICD-10-CM | POA: Diagnosis not present

## 2022-09-07 DIAGNOSIS — K5901 Slow transit constipation: Secondary | ICD-10-CM | POA: Diagnosis not present

## 2022-09-12 ENCOUNTER — Other Ambulatory Visit: Payer: Self-pay | Admitting: *Deleted

## 2022-09-12 DIAGNOSIS — R262 Difficulty in walking, not elsewhere classified: Secondary | ICD-10-CM | POA: Diagnosis not present

## 2022-09-12 DIAGNOSIS — I69322 Dysarthria following cerebral infarction: Secondary | ICD-10-CM | POA: Diagnosis not present

## 2022-09-12 DIAGNOSIS — I69391 Dysphagia following cerebral infarction: Secondary | ICD-10-CM | POA: Diagnosis not present

## 2022-09-12 DIAGNOSIS — R278 Other lack of coordination: Secondary | ICD-10-CM | POA: Diagnosis not present

## 2022-09-12 DIAGNOSIS — J9601 Acute respiratory failure with hypoxia: Secondary | ICD-10-CM | POA: Diagnosis not present

## 2022-09-12 DIAGNOSIS — M6281 Muscle weakness (generalized): Secondary | ICD-10-CM | POA: Diagnosis not present

## 2022-09-12 DIAGNOSIS — I63412 Cerebral infarction due to embolism of left middle cerebral artery: Secondary | ICD-10-CM | POA: Diagnosis not present

## 2022-09-12 DIAGNOSIS — I639 Cerebral infarction, unspecified: Secondary | ICD-10-CM | POA: Diagnosis not present

## 2022-09-12 NOTE — Patient Outreach (Signed)
[place] Coordinator follow up. Verified in [facility] Mr. Dolinger discharged from [facility] SN[facility] on 09/11/22.   Confirmed with SNF social worker Mr. Knierim transitioned to [place].   No identifiable [facility] care coordination needs.    Marthenia Rolling, MSN, RN,BSN [place] Acute Care Coordinator [phone] (Direct dial)

## 2022-09-13 DIAGNOSIS — R278 Other lack of coordination: Secondary | ICD-10-CM | POA: Diagnosis not present

## 2022-09-13 DIAGNOSIS — I69322 Dysarthria following cerebral infarction: Secondary | ICD-10-CM | POA: Diagnosis not present

## 2022-09-13 DIAGNOSIS — I69391 Dysphagia following cerebral infarction: Secondary | ICD-10-CM | POA: Diagnosis not present

## 2022-09-13 DIAGNOSIS — M6281 Muscle weakness (generalized): Secondary | ICD-10-CM | POA: Diagnosis not present

## 2022-09-13 DIAGNOSIS — I639 Cerebral infarction, unspecified: Secondary | ICD-10-CM | POA: Diagnosis not present

## 2022-09-13 DIAGNOSIS — I63412 Cerebral infarction due to embolism of left middle cerebral artery: Secondary | ICD-10-CM | POA: Diagnosis not present

## 2022-09-14 DIAGNOSIS — I69391 Dysphagia following cerebral infarction: Secondary | ICD-10-CM | POA: Diagnosis not present

## 2022-09-14 DIAGNOSIS — I69322 Dysarthria following cerebral infarction: Secondary | ICD-10-CM | POA: Diagnosis not present

## 2022-09-14 DIAGNOSIS — I63412 Cerebral infarction due to embolism of left middle cerebral artery: Secondary | ICD-10-CM | POA: Diagnosis not present

## 2022-09-14 DIAGNOSIS — R278 Other lack of coordination: Secondary | ICD-10-CM | POA: Diagnosis not present

## 2022-09-14 DIAGNOSIS — M6281 Muscle weakness (generalized): Secondary | ICD-10-CM | POA: Diagnosis not present

## 2022-09-14 DIAGNOSIS — I639 Cerebral infarction, unspecified: Secondary | ICD-10-CM | POA: Diagnosis not present

## 2022-09-17 ENCOUNTER — Emergency Department (HOSPITAL_COMMUNITY): Payer: Medicare Other

## 2022-09-17 ENCOUNTER — Inpatient Hospital Stay (HOSPITAL_COMMUNITY)
Admission: EM | Admit: 2022-09-17 | Discharge: 2022-10-12 | DRG: 871 | Disposition: E | Payer: Medicare Other | Source: Skilled Nursing Facility | Attending: Internal Medicine | Admitting: Internal Medicine

## 2022-09-17 ENCOUNTER — Encounter (HOSPITAL_COMMUNITY): Payer: Self-pay | Admitting: Emergency Medicine

## 2022-09-17 ENCOUNTER — Other Ambulatory Visit: Payer: Self-pay

## 2022-09-17 DIAGNOSIS — R5381 Other malaise: Secondary | ICD-10-CM | POA: Diagnosis present

## 2022-09-17 DIAGNOSIS — E039 Hypothyroidism, unspecified: Secondary | ICD-10-CM | POA: Diagnosis present

## 2022-09-17 DIAGNOSIS — R278 Other lack of coordination: Secondary | ICD-10-CM | POA: Diagnosis not present

## 2022-09-17 DIAGNOSIS — E870 Hyperosmolality and hypernatremia: Secondary | ICD-10-CM | POA: Diagnosis present

## 2022-09-17 DIAGNOSIS — I129 Hypertensive chronic kidney disease with stage 1 through stage 4 chronic kidney disease, or unspecified chronic kidney disease: Secondary | ICD-10-CM | POA: Diagnosis present

## 2022-09-17 DIAGNOSIS — Z515 Encounter for palliative care: Secondary | ICD-10-CM

## 2022-09-17 DIAGNOSIS — A419 Sepsis, unspecified organism: Principal | ICD-10-CM | POA: Diagnosis present

## 2022-09-17 DIAGNOSIS — R000 Tachycardia, unspecified: Secondary | ICD-10-CM | POA: Diagnosis not present

## 2022-09-17 DIAGNOSIS — N1831 Chronic kidney disease, stage 3a: Secondary | ICD-10-CM | POA: Diagnosis not present

## 2022-09-17 DIAGNOSIS — E860 Dehydration: Secondary | ICD-10-CM | POA: Diagnosis present

## 2022-09-17 DIAGNOSIS — J690 Pneumonitis due to inhalation of food and vomit: Secondary | ICD-10-CM | POA: Diagnosis present

## 2022-09-17 DIAGNOSIS — J9601 Acute respiratory failure with hypoxia: Secondary | ICD-10-CM | POA: Diagnosis not present

## 2022-09-17 DIAGNOSIS — I69322 Dysarthria following cerebral infarction: Secondary | ICD-10-CM

## 2022-09-17 DIAGNOSIS — I69351 Hemiplegia and hemiparesis following cerebral infarction affecting right dominant side: Secondary | ICD-10-CM | POA: Diagnosis not present

## 2022-09-17 DIAGNOSIS — I499 Cardiac arrhythmia, unspecified: Secondary | ICD-10-CM | POA: Diagnosis not present

## 2022-09-17 DIAGNOSIS — R404 Transient alteration of awareness: Secondary | ICD-10-CM | POA: Diagnosis not present

## 2022-09-17 DIAGNOSIS — R739 Hyperglycemia, unspecified: Secondary | ICD-10-CM | POA: Diagnosis not present

## 2022-09-17 DIAGNOSIS — J9811 Atelectasis: Secondary | ICD-10-CM | POA: Diagnosis not present

## 2022-09-17 DIAGNOSIS — Z9841 Cataract extraction status, right eye: Secondary | ICD-10-CM

## 2022-09-17 DIAGNOSIS — M4312 Spondylolisthesis, cervical region: Secondary | ICD-10-CM | POA: Diagnosis not present

## 2022-09-17 DIAGNOSIS — Z1152 Encounter for screening for COVID-19: Secondary | ICD-10-CM | POA: Diagnosis not present

## 2022-09-17 DIAGNOSIS — E7800 Pure hypercholesterolemia, unspecified: Secondary | ICD-10-CM | POA: Diagnosis present

## 2022-09-17 DIAGNOSIS — Z6823 Body mass index (BMI) 23.0-23.9, adult: Secondary | ICD-10-CM

## 2022-09-17 DIAGNOSIS — Z79899 Other long term (current) drug therapy: Secondary | ICD-10-CM

## 2022-09-17 DIAGNOSIS — S199XXA Unspecified injury of neck, initial encounter: Secondary | ICD-10-CM | POA: Diagnosis not present

## 2022-09-17 DIAGNOSIS — R296 Repeated falls: Secondary | ICD-10-CM | POA: Diagnosis present

## 2022-09-17 DIAGNOSIS — R0902 Hypoxemia: Secondary | ICD-10-CM | POA: Diagnosis not present

## 2022-09-17 DIAGNOSIS — K921 Melena: Secondary | ICD-10-CM | POA: Diagnosis present

## 2022-09-17 DIAGNOSIS — R627 Adult failure to thrive: Secondary | ICD-10-CM | POA: Diagnosis not present

## 2022-09-17 DIAGNOSIS — Z7902 Long term (current) use of antithrombotics/antiplatelets: Secondary | ICD-10-CM

## 2022-09-17 DIAGNOSIS — Z87442 Personal history of urinary calculi: Secondary | ICD-10-CM

## 2022-09-17 DIAGNOSIS — I69391 Dysphagia following cerebral infarction: Secondary | ICD-10-CM | POA: Diagnosis not present

## 2022-09-17 DIAGNOSIS — Z7189 Other specified counseling: Secondary | ICD-10-CM | POA: Diagnosis not present

## 2022-09-17 DIAGNOSIS — Z79890 Hormone replacement therapy (postmenopausal): Secondary | ICD-10-CM | POA: Diagnosis not present

## 2022-09-17 DIAGNOSIS — N179 Acute kidney failure, unspecified: Secondary | ICD-10-CM | POA: Diagnosis present

## 2022-09-17 DIAGNOSIS — G9341 Metabolic encephalopathy: Secondary | ICD-10-CM | POA: Diagnosis present

## 2022-09-17 DIAGNOSIS — E869 Volume depletion, unspecified: Secondary | ICD-10-CM | POA: Diagnosis present

## 2022-09-17 DIAGNOSIS — G9340 Encephalopathy, unspecified: Secondary | ICD-10-CM

## 2022-09-17 DIAGNOSIS — R6889 Other general symptoms and signs: Secondary | ICD-10-CM | POA: Diagnosis not present

## 2022-09-17 DIAGNOSIS — D7589 Other specified diseases of blood and blood-forming organs: Secondary | ICD-10-CM | POA: Diagnosis not present

## 2022-09-17 DIAGNOSIS — E8809 Other disorders of plasma-protein metabolism, not elsewhere classified: Secondary | ICD-10-CM | POA: Diagnosis present

## 2022-09-17 DIAGNOSIS — R6521 Severe sepsis with septic shock: Secondary | ICD-10-CM | POA: Diagnosis present

## 2022-09-17 DIAGNOSIS — R7401 Elevation of levels of liver transaminase levels: Secondary | ICD-10-CM | POA: Diagnosis present

## 2022-09-17 DIAGNOSIS — Z66 Do not resuscitate: Secondary | ICD-10-CM | POA: Diagnosis not present

## 2022-09-17 DIAGNOSIS — M47812 Spondylosis without myelopathy or radiculopathy, cervical region: Secondary | ICD-10-CM | POA: Diagnosis not present

## 2022-09-17 DIAGNOSIS — I69320 Aphasia following cerebral infarction: Secondary | ICD-10-CM

## 2022-09-17 DIAGNOSIS — K5900 Constipation, unspecified: Secondary | ICD-10-CM | POA: Diagnosis present

## 2022-09-17 DIAGNOSIS — R1310 Dysphagia, unspecified: Secondary | ICD-10-CM | POA: Diagnosis present

## 2022-09-17 DIAGNOSIS — R079 Chest pain, unspecified: Secondary | ICD-10-CM | POA: Diagnosis not present

## 2022-09-17 DIAGNOSIS — Z9842 Cataract extraction status, left eye: Secondary | ICD-10-CM

## 2022-09-17 DIAGNOSIS — Z743 Need for continuous supervision: Secondary | ICD-10-CM | POA: Diagnosis not present

## 2022-09-17 DIAGNOSIS — I639 Cerebral infarction, unspecified: Secondary | ICD-10-CM | POA: Diagnosis not present

## 2022-09-17 DIAGNOSIS — F419 Anxiety disorder, unspecified: Secondary | ICD-10-CM | POA: Diagnosis present

## 2022-09-17 DIAGNOSIS — Z961 Presence of intraocular lens: Secondary | ICD-10-CM | POA: Diagnosis present

## 2022-09-17 DIAGNOSIS — I63412 Cerebral infarction due to embolism of left middle cerebral artery: Secondary | ICD-10-CM | POA: Diagnosis not present

## 2022-09-17 DIAGNOSIS — M6281 Muscle weakness (generalized): Secondary | ICD-10-CM | POA: Diagnosis not present

## 2022-09-17 LAB — COMPREHENSIVE METABOLIC PANEL
ALT: 54 U/L — ABNORMAL HIGH (ref 0–44)
AST: 46 U/L — ABNORMAL HIGH (ref 15–41)
Albumin: 2.2 g/dL — ABNORMAL LOW (ref 3.5–5.0)
Alkaline Phosphatase: 88 U/L (ref 38–126)
Anion gap: 15 (ref 5–15)
BUN: 125 mg/dL — ABNORMAL HIGH (ref 8–23)
CO2: 23 mmol/L (ref 22–32)
Calcium: 9.2 mg/dL (ref 8.9–10.3)
Chloride: 128 mmol/L — ABNORMAL HIGH (ref 98–111)
Creatinine, Ser: 4.53 mg/dL — ABNORMAL HIGH (ref 0.61–1.24)
GFR, Estimated: 12 mL/min — ABNORMAL LOW (ref >60)
Glucose, Bld: 214 mg/dL — ABNORMAL HIGH (ref 70–99)
Potassium: 4.7 mmol/L (ref 3.5–5.1)
Sodium: 166 mmol/L (ref 135–145)
Total Bilirubin: 0.8 mg/dL (ref 0.3–1.2)
Total Protein: 5.9 g/dL — ABNORMAL LOW (ref 6.5–8.1)

## 2022-09-17 LAB — URINALYSIS, ROUTINE W REFLEX MICROSCOPIC
Bilirubin Urine: NEGATIVE
Glucose, UA: NEGATIVE mg/dL
Ketones, ur: NEGATIVE mg/dL
Leukocytes,Ua: NEGATIVE
Nitrite: NEGATIVE
Protein, ur: 30 mg/dL — AB
Specific Gravity, Urine: 1.016 (ref 1.005–1.030)
pH: 5 (ref 5.0–8.0)

## 2022-09-17 LAB — CBC WITH DIFFERENTIAL/PLATELET
Abs Immature Granulocytes: 0.12 10*3/uL — ABNORMAL HIGH (ref 0.00–0.07)
Basophils Absolute: 0 10*3/uL (ref 0.0–0.1)
Basophils Relative: 0 %
Eosinophils Absolute: 0 10*3/uL (ref 0.0–0.5)
Eosinophils Relative: 0 %
HCT: 49.6 % (ref 39.0–52.0)
Hemoglobin: 15.2 g/dL (ref 13.0–17.0)
Immature Granulocytes: 1 %
Lymphocytes Relative: 15 %
Lymphs Abs: 2.8 10*3/uL (ref 0.7–4.0)
MCH: 31.9 pg (ref 26.0–34.0)
MCHC: 30.6 g/dL (ref 30.0–36.0)
MCV: 104.2 fL — ABNORMAL HIGH (ref 80.0–100.0)
Monocytes Absolute: 1.3 10*3/uL — ABNORMAL HIGH (ref 0.1–1.0)
Monocytes Relative: 7 %
Neutro Abs: 14.7 10*3/uL — ABNORMAL HIGH (ref 1.7–7.7)
Neutrophils Relative %: 77 %
Platelets: 168 10*3/uL (ref 150–400)
RBC: 4.76 MIL/uL (ref 4.22–5.81)
RDW: 13.7 % (ref 11.5–15.5)
WBC: 19 10*3/uL — ABNORMAL HIGH (ref 4.0–10.5)
nRBC: 0.1 % (ref 0.0–0.2)

## 2022-09-17 LAB — PROTIME-INR
INR: 1.4 — ABNORMAL HIGH (ref 0.8–1.2)
Prothrombin Time: 17.4 seconds — ABNORMAL HIGH (ref 11.4–15.2)

## 2022-09-17 LAB — LACTIC ACID, PLASMA
Lactic Acid, Venous: 5.1 mmol/L (ref 0.5–1.9)
Lactic Acid, Venous: 5.7 mmol/L (ref 0.5–1.9)
Lactic Acid, Venous: 5.8 mmol/L (ref 0.5–1.9)
Lactic Acid, Venous: 5.7 mmol/L (ref 0.5–1.9)

## 2022-09-17 LAB — RESP PANEL BY RT-PCR (FLU A&B, COVID) ARPGX2
Influenza A by PCR: NEGATIVE
Influenza B by PCR: NEGATIVE
SARS Coronavirus 2 by RT PCR: NEGATIVE

## 2022-09-17 LAB — BASIC METABOLIC PANEL
Anion gap: 16 — ABNORMAL HIGH (ref 5–15)
Anion gap: 17 — ABNORMAL HIGH (ref 5–15)
BUN: 112 mg/dL — ABNORMAL HIGH (ref 8–23)
BUN: 111 mg/dL — ABNORMAL HIGH (ref 8–23)
CO2: 23 mmol/L (ref 22–32)
CO2: 18 mmol/L — ABNORMAL LOW (ref 22–32)
Calcium: 8.6 mg/dL — ABNORMAL LOW (ref 8.9–10.3)
Calcium: 8.5 mg/dL — ABNORMAL LOW (ref 8.9–10.3)
Chloride: 123 mmol/L — ABNORMAL HIGH (ref 98–111)
Chloride: 124 mmol/L — ABNORMAL HIGH (ref 98–111)
Creatinine, Ser: 3.83 mg/dL — ABNORMAL HIGH (ref 0.61–1.24)
Creatinine, Ser: 3.68 mg/dL — ABNORMAL HIGH (ref 0.61–1.24)
GFR, Estimated: 15 mL/min — ABNORMAL LOW (ref >60)
GFR, Estimated: 15 mL/min — ABNORMAL LOW (ref >60)
Glucose, Bld: 188 mg/dL — ABNORMAL HIGH (ref 70–99)
Glucose, Bld: 212 mg/dL — ABNORMAL HIGH (ref 70–99)
Potassium: 4.3 mmol/L (ref 3.5–5.1)
Potassium: 4.3 mmol/L (ref 3.5–5.1)
Sodium: 162 mmol/L (ref 135–145)
Sodium: 159 mmol/L — ABNORMAL HIGH (ref 135–145)

## 2022-09-17 LAB — I-STAT VENOUS BLOOD GAS, ED
Acid-base deficit: 3 mmol/L — ABNORMAL HIGH (ref 0.0–2.0)
Bicarbonate: 24.3 mmol/L (ref 20.0–28.0)
Calcium, Ion: 1.16 mmol/L (ref 1.15–1.40)
HCT: 42 % (ref 39.0–52.0)
Hemoglobin: 14.3 g/dL (ref 13.0–17.0)
O2 Saturation: 27 %
Potassium: 4.4 mmol/L (ref 3.5–5.1)
Sodium: 164 mmol/L (ref 135–145)
TCO2: 26 mmol/L (ref 22–32)
pCO2, Ven: 49.5 mmHg (ref 44–60)
pH, Ven: 7.298 (ref 7.25–7.43)
pO2, Ven: 20 mmHg — CL (ref 32–45)

## 2022-09-17 LAB — MRSA NEXT GEN BY PCR, NASAL: MRSA by PCR Next Gen: NOT DETECTED

## 2022-09-17 LAB — I-STAT CHEM 8, ED
BUN: >130 mg/dL — ABNORMAL HIGH (ref 8–23)
Calcium, Ion: 1.16 mmol/L (ref 1.15–1.40)
Chloride: 125 mmol/L — ABNORMAL HIGH (ref 98–111)
Creatinine, Ser: 4.4 mg/dL — ABNORMAL HIGH (ref 0.61–1.24)
Glucose, Bld: 203 mg/dL — ABNORMAL HIGH (ref 70–99)
HCT: 44 % (ref 39.0–52.0)
Hemoglobin: 15 g/dL (ref 13.0–17.0)
Potassium: 4.5 mmol/L (ref 3.5–5.1)
Sodium: 165 mmol/L (ref 135–145)
TCO2: 25 mmol/L (ref 22–32)

## 2022-09-17 LAB — CBG MONITORING, ED
Glucose-Capillary: 207 mg/dL — ABNORMAL HIGH (ref 70–99)
Glucose-Capillary: 171 mg/dL — ABNORMAL HIGH (ref 70–99)

## 2022-09-17 LAB — GLUCOSE, CAPILLARY: Glucose-Capillary: 139 mg/dL — ABNORMAL HIGH (ref 70–99)

## 2022-09-17 LAB — PROCALCITONIN: Procalcitonin: 0.88 ng/mL

## 2022-09-17 LAB — APTT: aPTT: 27 seconds (ref 24–36)

## 2022-09-17 MED ORDER — NOREPINEPHRINE 4 MG/250ML-% IV SOLN
2.0000 ug/min | INTRAVENOUS | Status: DC
  Administered 2022-09-17: 3 ug/min via INTRAVENOUS
  Filled 2022-09-17: qty 250

## 2022-09-17 MED ORDER — VANCOMYCIN HCL IN DEXTROSE 1-5 GM/200ML-% IV SOLN: 1000.0000 mg | Freq: Once | INTRAVENOUS | Status: DC

## 2022-09-17 MED ORDER — VANCOMYCIN VARIABLE DOSE PER UNSTABLE RENAL FUNCTION (PHARMACIST DOSING): Status: DC

## 2022-09-17 MED ORDER — DEXTROSE 5 % IV SOLN: INTRAVENOUS | Status: DC

## 2022-09-17 MED ORDER — IPRATROPIUM-ALBUTEROL 0.5-2.5 (3) MG/3ML IN SOLN
3.0000 mL | Freq: Once | RESPIRATORY_TRACT | Status: AC
  Administered 2022-09-17: 3 mL via RESPIRATORY_TRACT
  Filled 2022-09-17: qty 3

## 2022-09-17 MED ORDER — INSULIN ASPART 100 UNIT/ML IJ SOLN
0.0000 [IU] | INTRAMUSCULAR | Status: DC
  Administered 2022-09-17: 3 [IU] via SUBCUTANEOUS
  Administered 2022-09-18: 1 [IU] via SUBCUTANEOUS
  Administered 2022-09-18: 2 [IU] via SUBCUTANEOUS

## 2022-09-17 MED ORDER — LACTATED RINGERS IV SOLN: INTRAVENOUS | Status: DC

## 2022-09-17 MED ORDER — SODIUM CHLORIDE 0.9 % IV SOLN
1.0000 g | INTRAVENOUS | Status: DC
  Filled 2022-09-17: qty 10

## 2022-09-17 MED ORDER — HEPARIN SODIUM (PORCINE) 5000 UNIT/ML IJ SOLN
5000.0000 [IU] | Freq: Three times a day (TID) | INTRAMUSCULAR | Status: DC
  Administered 2022-09-18: 5000 [IU] via SUBCUTANEOUS
  Filled 2022-09-17: qty 1

## 2022-09-17 MED ORDER — METHYLPREDNISOLONE SODIUM SUCC 125 MG IJ SOLR: 125.0000 mg | Freq: Once | INTRAMUSCULAR | Status: DC

## 2022-09-17 MED ORDER — DOCUSATE SODIUM 100 MG PO CAPS: 100.0000 mg | ORAL_CAPSULE | Freq: Two times a day (BID) | ORAL | Status: DC | PRN

## 2022-09-17 MED ORDER — NOREPINEPHRINE 4 MG/250ML-% IV SOLN
0.0000 ug/min | INTRAVENOUS | Status: DC
  Administered 2022-09-17: 2 ug/min via INTRAVENOUS

## 2022-09-17 MED ORDER — CHLORHEXIDINE GLUCONATE CLOTH 2 % EX PADS
6.0000 | MEDICATED_PAD | Freq: Every day | CUTANEOUS | Status: DC
  Administered 2022-09-17: 6 via TOPICAL

## 2022-09-17 MED ORDER — LACTATED RINGERS IV BOLUS
500.0000 mL | Freq: Once | INTRAVENOUS | Status: AC
  Administered 2022-09-17: 500 mL via INTRAVENOUS

## 2022-09-17 MED ORDER — HALOPERIDOL LACTATE 5 MG/ML IJ SOLN
2.0000 mg | Freq: Four times a day (QID) | INTRAMUSCULAR | Status: DC | PRN
  Administered 2022-09-17: 2 mg via INTRAVENOUS
  Filled 2022-09-17: qty 1

## 2022-09-17 MED ORDER — SODIUM CHLORIDE 0.9 % IV SOLN: 250.0000 mL | INTRAVENOUS | Status: DC

## 2022-09-17 MED ORDER — SODIUM CHLORIDE 0.9 % IV SOLN
2.0000 g | Freq: Once | INTRAVENOUS | Status: AC
  Administered 2022-09-17: 2 g via INTRAVENOUS
  Filled 2022-09-17: qty 12.5

## 2022-09-17 MED ORDER — VANCOMYCIN HCL 1500 MG/300ML IV SOLN
1500.0000 mg | Freq: Once | INTRAVENOUS | Status: AC
  Administered 2022-09-17: 1500 mg via INTRAVENOUS
  Filled 2022-09-17: qty 300

## 2022-09-17 MED ORDER — LACTATED RINGERS IV BOLUS
1000.0000 mL | Freq: Once | INTRAVENOUS | Status: AC
  Administered 2022-09-17: 1000 mL via INTRAVENOUS

## 2022-09-17 MED ORDER — LACTATED RINGERS IV BOLUS (SEPSIS)
2000.0000 mL | Freq: Once | INTRAVENOUS | Status: AC
  Administered 2022-09-17: 2000 mL via INTRAVENOUS

## 2022-09-17 MED ORDER — POLYETHYLENE GLYCOL 3350 17 G PO PACK: 17.0000 g | PACK | Freq: Every day | ORAL | Status: DC | PRN

## 2022-09-17 NOTE — Progress Notes (Signed)
Pharmacy Antibiotic Note  [name] is a 87 y.o. male admitted on 10/01/2022 with Sepsis.  Pharmacy has been consulted for Vancomycin and Cefepime dosing.  Patient in AKI with Scr 4.4 (baseline <1) and CrCl <20 mL/min.   Plan: Vancomycin 1500mg  IV once No scheduled doses due to renal function - pharmacist will follow for AKI resolution and/or level assessment to determine further doses Cefepime 1g Q24H  Monitor culture results for de-escalation.    No data recorded.  No results for input(s): "WBC", "CREATININE", "LATICACIDVEN", "VANCOTROUGH", "VANCOPEAK", "VANCORANDOM", "GENTTROUGH", "GENTPEAK", "GENTRANDOM", "TOBRATROUGH", "TOBRAPEAK", "TOBRARND", "AMIKACINPEAK", "AMIKACINTROU", "AMIKACIN" in the last 168 hours.  CrCl cannot be calculated (Patient's most recent lab result is older than the maximum 21 days allowed.).    No Known Allergies  Antimicrobials this admission: Vanc 11/6 >> Cefepime 11/6 >>  Microbiology results: 11/6 Bcx: sent 11/6 MRSA: ordered  Thank you for allowing pharmacy to be a part of this patient's care.  Merrilee Jansky, PharmD Clinical Pharmacist 09/26/2022 11:54 AM

## 2022-09-17 NOTE — Progress Notes (Signed)
eLink Physician-Brief Progress Note Patient Name: [name] DOB: [birth year] MRN: [id number]   Date of Service  10/01/2022  HPI/Events of Note  Patient admitted with altered mental status, suspected infection, and respiratory difficulty, work up in progress.   eICU Interventions  New Patient Evaluation        Frederik Pear 09/12/2022, 9:23 PM

## 2022-09-17 NOTE — ED Provider Notes (Signed)
[facility] EMERGENCY DEPARTMENT Provider Note   CSN: [id number] Arrival date & time: 09/18/2022  1134     History  Chief Complaint  Patient presents with   Altered Mental Status    [name] is a 87 y.o. male.  Level 5 COVID, patient arrives with altered mental status.  Per EMS and per patient's daughter was not breathing well overnight.  More confused this morning.  Had a recent stroke and appendicitis.  Has had memory issues and swallowing issues and general decline since this occurred.  Patient has history of hypertension, high cholesterol, chronic kidney disease.  Family states that facility said that he maybe fell once or twice this past week but did not seem to hit his head.  Never came for evaluation.  He has been having some respiratory issues and cough.  Overall patient cannot provide any history.  Patient hypotensive with EMS.  He got 1 L of fluids.  He had bad cough with rattling in his chest.  He was given a breathing treatment.  Placed on a nonrebreather.  The history is provided by the patient.       Home Medications Prior to Admission medications   Medication Sig Start Date End Date Taking? Authorizing Provider  acetaminophen (TYLENOL) 650 MG CR tablet Take 650 mg by mouth 2 (two) times daily as needed for pain.   Yes [provider]  atorvastatin (LIPITOR) 80 MG tablet Take 1 tablet (80 mg total) by mouth daily. 08/01/22 10/06/2022 Yes Lanae Boast, MD  Calcium Carb-Cholecalciferol (CALCIUM + D3 PO) Take 1 tablet by mouth daily.   Yes [provider]  cefTRIAXone (ROCEPHIN) IVPB Inject 1 g into the vein once.   Yes [provider]  clopidogrel (PLAVIX) 75 MG tablet Take 75 mg by mouth daily. 09/08/22  Yes [provider]  levothyroxine (SYNTHROID) 50 MCG tablet Take 50 mcg by mouth daily before breakfast.   Yes [provider]  LORazepam (ATIVAN) 0.5 MG tablet Take 0.5 mg by mouth 2 (two) times daily as  needed for anxiety. 08/22/22  Yes [provider]  losartan (COZAAR) 25 MG tablet Take 25 mg by mouth daily.   Yes [provider]  mirtazapine (REMERON) 7.5 MG tablet Take 7.5 mg by mouth at bedtime. 09/08/22  Yes [provider]  Multiple Vitamins-Minerals (CENTRUM SILVER 50+MEN) TABS Take 1 tablet by mouth daily.   Yes [provider]  Omega-3 Fatty Acids (FISH OIL PO) Take 1 capsule by mouth daily.   Yes [provider]  polyethylene glycol (MIRALAX / GLYCOLAX) 17 g packet Take 17 g by mouth daily.   Yes [provider]  Polyethylene Glycol 400 1 % SOLN Place 1 drop into both eyes 2 (two) times daily.   Yes [provider]  senna-docusate (SENOKOT-S) 8.6-50 MG tablet Take 2 tablets by mouth at bedtime.   Yes [provider]  TIOTROPIUM BROMIDE MONOHYDRATE IN Inhale 1 Dose into the lungs 3 (three) times daily.   Yes [provider]      Allergies    Patient has no known allergies.    Review of Systems   Review of Systems  Physical Exam Updated Vital Signs BP (!) 86/46   Pulse (!) 102   Temp 97.8 F (36.6 C) (Rectal)   Resp (!) 29   SpO2 94%  Physical Exam Vitals and nursing note reviewed.  Constitutional:      General: He is in acute distress.  Appearance: He is well-developed. He is ill-appearing.  HENT:     Head: Normocephalic and atraumatic.     Mouth/Throat:     Mouth: Mucous membranes are dry.  Eyes:     Extraocular Movements: Extraocular movements intact.     Conjunctiva/sclera: Conjunctivae normal.     Pupils: Pupils are equal, round, and reactive to light.  Cardiovascular:     Rate and Rhythm: Regular rhythm. Tachycardia present.     Heart sounds: No murmur heard. Pulmonary:     Effort: Respiratory distress present.     Breath sounds: Rhonchi present.     Comments: Increased respiratory effort with coarse breath sounds throughout Abdominal:     Palpations: Abdomen is soft.      Tenderness: There is no abdominal tenderness.  Genitourinary:    Comments: Patient has very loose brown stool in his depends, there is some bright red blood tinged within this Musculoskeletal:        General: No swelling.     Cervical back: Neck supple.  Skin:    General: Skin is warm and dry.     Capillary Refill: Capillary refill takes less than 2 seconds.  Neurological:     Comments: Patient opens eyes spontaneously, withdraws to pain, mumbles  Psychiatric:        Mood and Affect: Mood normal.     ED Results / Procedures / Treatments   Labs (all labs ordered are listed, but only abnormal results are displayed) Labs Reviewed  LACTIC ACID, PLASMA - Abnormal; Notable for the following components:      Result Value   Lactic Acid, Venous 5.1 (*)    All other components within normal limits  COMPREHENSIVE METABOLIC PANEL - Abnormal; Notable for the following components:   Sodium 166 (*)    Chloride 128 (*)    Glucose, Bld 214 (*)    BUN 125 (*)    Creatinine, Ser 4.53 (*)    Total Protein 5.9 (*)    Albumin 2.2 (*)    AST 46 (*)    ALT 54 (*)    GFR, Estimated 12 (*)    All other components within normal limits  CBC WITH DIFFERENTIAL/PLATELET - Abnormal; Notable for the following components:   WBC 19.0 (*)    MCV 104.2 (*)    Neutro Abs 14.7 (*)    Monocytes Absolute 1.3 (*)    Abs Immature Granulocytes 0.12 (*)    All other components within normal limits  PROTIME-INR - Abnormal; Notable for the following components:   Prothrombin Time 17.4 (*)    INR 1.4 (*)    All other components within normal limits  I-STAT CHEM 8, ED - Abnormal; Notable for the following components:   Sodium 165 (*)    Chloride 125 (*)    BUN >130 (*)    Creatinine, Ser 4.40 (*)    Glucose, Bld 203 (*)    All other components within normal limits  I-STAT VENOUS BLOOD GAS, ED - Abnormal; Notable for the following components:   pO2, Ven 20 (*)    Acid-base deficit 3.0 (*)    Sodium 164 (*)     All other components within normal limits  CBG MONITORING, ED - Abnormal; Notable for the following components:   Glucose-Capillary 207 (*)    All other components within normal limits  RESP PANEL BY RT-PCR (FLU A&B, COVID) ARPGX2  CULTURE, BLOOD (ROUTINE X 2)  CULTURE, BLOOD (ROUTINE X 2)  URINE CULTURE  MRSA NEXT GEN BY PCR, NASAL  APTT  LACTIC ACID, PLASMA  URINALYSIS, ROUTINE W REFLEX MICROSCOPIC    EKG EKG Interpretation  Date/Time:  Monday September 17 2022 11:44:05 EST Ventricular Rate:  115 PR Interval:  140 QRS Duration: 93 QT Interval:  344 QTC Calculation: 476 R Axis:   127 Text Interpretation: Sinus tachycardia Probable left atrial enlargement Left posterior fascicular block Low voltage, precordial leads Consider anterior infarct Borderline T abnormalities, inferior leads Confirmed by Virgina Norfolk ([id number]) on 09/15/2022 11:47:57 AM  Radiology CT HEAD WO CONTRAST ( )  Result Date: 09/30/2022 CLINICAL DATA:  Mental status change, unknown cause EXAM: CT HEAD WITHOUT CONTRAST TECHNIQUE: Contiguous axial images were obtained from the base of the skull through the vertex without intravenous contrast. RADIATION DOSE REDUCTION: This exam was performed according to the departmental dose-optimization program which includes automated exposure control, adjustment of the mA and/or kV according to patient size and/or use of iterative reconstruction technique. COMPARISON:  07/26/2022 FINDINGS: Brain: There is atrophy and chronic small vessel disease changes. No acute intracranial abnormality. Specifically, no hemorrhage, hydrocephalus, mass lesion, acute infarction, or significant intracranial injury. Vascular: No hyperdense vessel or unexpected calcification. Skull: No acute calvarial abnormality. Sinuses/Orbits: No acute findings Other: None IMPRESSION: Atrophy, chronic microvascular disease. No acute intracranial abnormality. Electronically Signed   By: Charlett Nose M.D.   On:  09/14/2022 14:21   CT Cervical Spine Wo Contrast  Result Date: 09/23/2022 CLINICAL DATA:  Neck trauma (Age >= 65y) EXAM: CT CERVICAL SPINE WITHOUT CONTRAST TECHNIQUE: Multidetector CT imaging of the cervical spine was performed without intravenous contrast. Multiplanar CT image reconstructions were also generated. RADIATION DOSE REDUCTION: This exam was performed according to the departmental dose-optimization program which includes automated exposure control, adjustment of the mA and/or kV according to patient size and/or use of iterative reconstruction technique. COMPARISON:  None Available. FINDINGS: Alignment: 3 mm degenerative anterolisthesis of C5 on C6. Skull base and vertebrae: No acute fracture. No primary bone lesion or focal pathologic process. Soft tissues and spinal canal: No prevertebral fluid or swelling. No visible canal hematoma. Disc levels: Degenerative disc disease from C5-6 through C6-7. Bilateral degenerative facet disease diffusely. Upper chest: No acute findings Other: None IMPRESSION: Degenerative changes.  No acute bony abnormality. Electronically Signed   By: Charlett Nose M.D.   On: 09/12/2022 14:19   DG Chest Port 1 View  Result Date: 09/15/2022 CLINICAL DATA:  Possible sepsis EXAM: PORTABLE CHEST 1 VIEW COMPARISON:  Previous studies including the examination done on 07/24/2022 FINDINGS: Transverse diameter of heart is increased. There are no signs of pulmonary edema. Increased markings are seen in the lower lung fields. There is poor inspiration. There is no focal pulmonary consolidation. Left lateral CP angle is not included in the radiograph. Right lateral CP angle is clear. There is no pneumothorax. IMPRESSION: There are no signs of pulmonary edema. Increased interstitial markings in the lower lung fields may be due to crowding of bronchovascular structures caused by poor inspiration or subsegmental atelectasis. Electronically Signed   By: Ernie Avena M.D.   On:  10/07/2022 13:18    Procedures .Critical Care  Performed by: Virgina Norfolk, DO Authorized by: Virgina Norfolk, DO   Critical care provider statement:    Critical care time (minutes):  40   Critical care was necessary to treat or prevent imminent or life-threatening deterioration of the following conditions:  Circulatory failure, respiratory failure and CNS failure or compromise   Critical care was time spent personally  by me on the following activities:  Blood draw for specimens, development of treatment plan with patient or surrogate, discussions with primary provider, evaluation of patient's response to treatment, examination of patient, obtaining history from patient or surrogate, ordering and performing treatments and interventions, ordering and review of laboratory studies, ordering and review of radiographic studies, pulse oximetry, re-evaluation of patient's condition and review of old charts   I assumed direction of critical care for this patient from another provider in my specialty: no       Medications Ordered in ED Medications  lactated ringers infusion (has no administration in time range)  vancomycin (VANCOREADY) IVPB 1500 mg/300 mL (has no administration in time range)  norepinephrine (LEVOPHED) 4mg  in 250mL (0.016 mg/mL) premix infusion (2 mcg/min Intravenous New Bag/Given 09/12/2022 1412)  lactated ringers bolus 2,000 mL (0 mLs Intravenous Stopped 09/22/2022 1411)  ceFEPIme (MAXIPIME) 2 g in sodium chloride 0.9 % 100 mL IVPB (0 g Intravenous Stopped 10/03/2022 1316)  ipratropium-albuterol (DUONEB) 0.5-2.5 (3) MG/3ML nebulizer solution 3 mL (3 mLs Nebulization Given 09/23/2022 1235)  lactated ringers bolus 500 mL (500 mLs Intravenous New Bag/Given 10/08/2022 1411)  lactated ringers bolus 1,000 mL (1,000 mLs Intravenous New Bag/Given 10/10/2022 1411)    ED Course/ Medical Decision Making/ A&P                           Medical Decision Making Amount and/or Complexity of Data  Reviewed Labs: ordered. Radiology: ordered. ECG/medicine tests: ordered.  Risk Prescription drug management. Decision regarding hospitalization.   [name] is here with altered mental status.  Patient arrives hypotensive in the 70s systolic, on a nonrebreather.  Rectal temperature is normal.  Appears that he has loose stool within his depends that are mostly brown but there is some blood-tinged within this.  He is confused, opens eyes spontaneously, withdraws to pain, mumbles in pain/discomfort.  Coarse breath sounds throughout.  Has a wet cough.  Abdomen appears to be nontender.  History of high cholesterol, hypertension, recent stroke, recent appendicitis.  Patient is DNR/DNI.  Talked with family on the phone.  Seems like he has not been doing well from a respiratory standpoint the last 24 hours.  Was more confused this morning with nurses at his nursing facility.  Differential diagnosis includes sepsis, GI bleed.  This seems less likely to be cardiac or neurologic in nature.  We will do sepsis work-up with CBC, CMP, blood cultures, urine studies, chest x-ray, lactic acid, COVID testing.  We will start IV broad-spectrum antibiotics.  We will start 30 cc/kg IV fluids.  He already got 1 L of fluids with EMS.  We will get head CT.  EKG per my review interpretation shows sinus rhythm.  No ischemic changes.  Per my review and interpretation labs patient with sodium of 165, creatinine of 4.4.  Lactic acid is 5.1.  Leukocytosis of 19.  CT scan of the head and neck per radiology report shows no acute findings.  Chest x-ray with no obvious pneumonia.  This was per my review and interpretation of x-ray.  Blood pressure continues to be in the 80s systolic despite 3 L of fluid resuscitation by myself as well as additional liter already given by EMS.  Decision was made to put patient on Levophed and admit to the ICU.  Family now may be wanting to reverse DNR.  We will have them make decisions with primary  team about this.  Mentation appears to  be improving slightly.  Overall patient requiring Levophed to maintain blood pressure now greater than 90 systolic.  Significant dehydration and hypernatremia.  ICU team to admit.  This chart was dictated using voice recognition software.  Despite best efforts to proofread,  errors can occur which can change the documentation meaning.         Final Clinical Impression(s) / ED Diagnoses Final diagnoses:  Hypernatremia  AKI (acute kidney injury) ([place])  Encephalopathy    Rx / DC Orders ED Discharge Orders     None         Lennice Sites, DO 09/16/2022 1446

## 2022-09-17 NOTE — ED Notes (Signed)
Dr.Curastolo shown results of istat VBG and Chem8.Ed-LAb

## 2022-09-17 NOTE — H&P (Addendum)
NAME:  [name], MRN:  [id number], DOB:  03-03-1935, LOS: 0 ADMISSION DATE:  10/09/2022 CONSULTATION DATE:  10/03/2022 REFERRING MD:  Lockie Mola - EDP CHIEF COMPLAINT:  AMS, hypotension   History of Present Illness:  87 year old man who presented to [facility] ED from SNF 11/6 via EMS for AMS. Report of poor respiratory status overnight progressing to confusion; recent falls per facility. 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 Recently hospitalized at [facility]  9/8 - 9/19 for lethargy/AMS/fall, found to have acute gangrenous perforated appendicitis requiring laparoscopic appendectomy c/b pulmonary edema and CVA (L lentiform nucleus, noted on MRI 9/14). Patient was discharged to rehab/SNF and then transferred to [facility].  History is obtained from patient's grandson at bedside.  He states that patient was admitted to the hospital 07/2022 for appendicitis; he underwent laparoscopic appendectomy for this and per grandson patient's condition has been slowly deteriorating since.  He initially tolerated surgery well, however his immediate postoperative course was complicated by pulmonary edema and subsequently CVA with residual right-sided deficits.  He was discharged to SNF for rehabilitation and once completing this/maximizing benefit was transferred to [facility].  Patient's grandson states that at his baseline he was conversant and able to do some things on his own; since surgery/CVA he has been fully dependent without use of his right side and ongoing aphasia/dysarthria.  Additionally, patient has struggled with constipation and decreased urine output.  He has not been eating or drinking well with significantly decreased PO intake for several weeks.  Patient's wife, at bedside, initially wanted to pursue full code status for patient (despite signed gold form at bedside) however  on further discussion opted for DNR as this was more in line with patient's previously discussed wishes.  On arrival to ED, patient was intermittently restless; afebrile with Tmax 97.30F, tachycardic to 120s, hypotension with SBP 86/46, RR 29, SpO2 94% on NRB. NS given by EMS. Labs were notable for WBC 19, H&H/Plt WNL, INR 1.4 (on Plavix), Na 166, K 4.7, Cl 128, CO2 23, Cr 4.53/BUN 125, glucose 214. Mild transaminitis with AST 46, ALT 54. LA 5.7 (5.1). CT Head NAICA, CT C-spine with no acute fracture, degenerative changes. CXR likely atelectasis. Broad-spectrum cefepime/vanc were initiated and Blood Cx/urine Cx sent. Levophed started for pressor support.  PCCM consulted for ICU admission.  Pertinent Medical History:   Past Medical History:  Diagnosis Date   Chronic kidney disease    left nephrolithiasis   Hematuria    ureteral stone and stent   Hyperlipidemia    Hypertension    Right ureteral stone    Subdural hematoma (HCC) 02/12/2015   Significant Hospital Events: Including procedures, antibiotic start and stop dates in addition to other pertinent events   11/6 Presented to [facility] ED via EMS from [facility] for AMS. Concern for sepsis. Post-stroke with residual R-sided deficits. CT Head/Cspine negative. Bcx pending. UA/UCx unable to obtain. DNR paperwork signed and at bedside, d/w patient's wife and this will be upheld. OK for pressors/abx/fluids. Levophed started in ED.  Interim History / Subjective:  PCCM consulted for ICU admission in the setting of pressor initiation.  Objective:  Blood pressure 106/65, pulse (!) 102, temperature 97.8 F (36.6 C), temperature source Rectal, resp. rate (!) 29, SpO2 94 %.       No intake or output data in the 24 hours ending 09/15/2022 1442 There were no vitals filed for this visit.  Physical Examination: General: Chronically ill-appearing elderly man in NAD, intermittently restless. HEENT: [place]/AT, mildly injected sclera bilaterally, pupils  equal round and 19mm, very dry mucous membranes with dry crusting to oral mucosal surfaces/tongue. Neuro: Lethargic. Intermittently restless/agitated. Responds to tactile and noxious stimuli. Withdraws to pain briskly on L, winces to pain with little withdrawal on R.Not following commands. Unilateral R-sided neglect noted. +Cough and +Gag  CV: Tachycardic to 120s, regular rhythm, no m/g/r. PULM: Breathing even and unlabored on 5LNC. Lung fields decreased at bases bilaterally. GI: Soft, nontender, nondistended. Normoactive bowel sounds. Well-healed incisions from recent laparoscopic appendectomy. Extremities: No LE edema noted. Skin: Warm/very dry, no rashes.  Resolved Hospital Problem List:    Assessment & Plan:  Septic shock with unknown source Lactic acidosis 5.1 on admission - Goal MAP > 65 - Aggressive fluid resuscitation with LR - Levophed titrated to goal MAP - Trend WBC, fever curve, LA - F/u Cx data - Continue broad-spectrum antibiotics (cefepime, vanc)  Altered mental status, likely multifactorial in the setting of sepsis, hypernatremia and recent CVA Recent falls CT Head 11/6 NAICA. CT C-spine with degenerative changes. Na 166 on admission. 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 CT Head 9/14 NAICA, stable atrophy and chronic small vessel ischemic changes. CTA Head/Neck 9/14 negative for LVO. - Persistent aphasia and dysarthria post-stroke with ongoing R-sided deficits - S/p SNF rehab with persistent deconditioning post surgery/stroke - Frequent neuro checks - DAPT held in the setting of NPO status, resume as clinically appropriate - PT/OT/SLP if recovering enough to participate in care  History of perforated appendicitis, s/p laparoscopic appendectomy Admitted 9/8 - 9/19 for  perforated/gangrenous appendicitis. S/p lap appendectomy. Initial postop course c/b pulmonary edema. - No apparent abdominal pain on exam, though difficult to illicit with ongoing aphasia - Low threshold for repeat CT A/P if clinically worsening - Broad-spectrum antibiotic coverage as above  AKI on CKD in the setting of severe dehydration/hypotension, prerenal Hypernatremia Na 166 on admission. - Trend BMP Q4H while Na corrects - Aggressive fluid resuscitation - Replete electrolytes as indicated - Monitor I&Os - F/u urine studies - Avoid nephrotoxic agents as able - Ensure adequate renal perfusion  Mild transaminitis Likely ?shock liver in the setting of hypotension. - Trend LFTs - Low threshold for repeat abdominal imaging, given recent surgery  Hypertension Hyperlipidemia - Hold ASA/Plavix, statin, antihypertensives for now  Hypothyroidism - Resume levothyroxine as clinically appropriate  At risk for malnutrition Severe physical deconditioning - No PEG/feeding tube per patient's wife - SLP once clinically appropriate - PT/OT as able  GOC Discussion at bedside with wife 11/6 (Dr. Vassie Loll). Patient's wife/grandson at bedside affirm that patient did not want invasive measures such as PEG/feeding tube or intubation/CPR. - Decision made for DNR code status, gold form at bedside - Supportive care as able - If patient condition were to deteriorate, family would turn focus to comfort care/hospice - PMT consult  Best Practice: (right click and "Reselect all SmartList Selections" daily)   Diet/type: NPO DVT prophylaxis: prophylactic heparin  GI prophylaxis: N/A Lines: N/A Foley:  N/A Code Status:  DNR - no intubation/PEG, ok for pressors/antibiotics/fluids; low threshold to transition to full comfort care if patient begins to deteriorate/struggle Last date of multidisciplinary goals of care discussion [11/6 Dr. Vassie Loll discussed with patient's wife and grandson at  bedside in  ED]  Labs:  CBC: Recent Labs  Lab 10/11/2022 1215 09/16/2022 1240 09/16/2022 1241  WBC 19.0*  --   --   NEUTROABS 14.7*  --   --   HGB 15.2 15.0 14.3  HCT 49.6 44.0 42.0  MCV 104.2*  --   --   PLT 168  --   --    Basic Metabolic Panel: Recent Labs  Lab 09/13/2022 1215 10/05/2022 1240 09/12/2022 1241  NA 166* 165* 164*  K 4.7 4.5 4.4  CL 128* 125*  --   CO2 23  --   --   GLUCOSE 214* 203*  --   BUN 125* >130*  --   CREATININE 4.53* 4.40*  --   CALCIUM 9.2  --   --    GFR: CrCl cannot be calculated (Unknown ideal weight.). Recent Labs  Lab 09/20/2022 1215  WBC 19.0*  LATICACIDVEN 5.1*   Liver Function Tests: Recent Labs  Lab 09/13/2022 1215  AST 46*  ALT 54*  ALKPHOS 88  BILITOT 0.8  PROT 5.9*  ALBUMIN 2.2*   No results for input(s): "LIPASE", "AMYLASE" in the last 168 hours. No results for input(s): "AMMONIA" in the last 168 hours.  ABG:    Component Value Date/Time   HCO3 24.3 09/29/2022 1241   TCO2 26 09/29/2022 1241   ACIDBASEDEF 3.0 (H) 10/11/2022 1241   O2SAT 27 09/16/2022 1241    Coagulation Profile: Recent Labs  Lab 09/24/2022 1215  INR 1.4*   Cardiac Enzymes: No results for input(s): "CKTOTAL", "CKMB", "CKMBINDEX", "TROPONINI" in the last 168 hours.  HbA1C: Hgb A1c MFr Bld  Date/Time Value Ref Range Status  07/27/2022 08:10 AM 6.6 (H) 4.8 - 5.6 % Final    Comment:    (NOTE) Pre diabetes:          5.7%-6.4%  Diabetes:              >6.4%  Glycemic control for   <7.0% adults with diabetes   06/03/2013 05:39 AM 5.8 (H) <5.7 % Final    Comment:    (NOTE)                                                                       According to the ADA Clinical Practice Recommendations for 2011, when HbA1c is used as a screening test:  >=6.5%   Diagnostic of Diabetes Mellitus           (if abnormal result is confirmed) 5.7-6.4%   Increased risk of developing Diabetes Mellitus References:Diagnosis and Classification of Diabetes  [place],Diabetes ZOXW,9604,54(UJWJX 1):S62-S69 and Standards of Medical Care in         Diabetes - 2011,Diabetes BJYN,8295,62 (Suppl 1):S11-S61.   CBG: Recent Labs  Lab 09/21/2022 1211  GLUCAP 207*   Review of Systems:   Patient is encephalopathic and/or intubated. Therefore history has been obtained from chart review.   Past Medical History:  He,  has a past medical history of Chronic kidney disease, Hematuria, Hyperlipidemia, Hypertension, Right ureteral stone, and Subdural hematoma ([place]) (02/12/2015).   Surgical History:   Past Surgical History:  Procedure Laterality Date   CATARACT EXTRACTION W/ INTRAOCULAR LENS  IMPLANT, BILATERAL     CRANIOTOMY Right 02/12/2015  Procedure: [name] for HEMATOMA EVACUATION SUBDURAL;  Surgeon: Hilda Lias, MD;  Location: [facility];  Service: Neurosurgery;  Laterality: Right;   CYSTOSCOPY W/ URETERAL STENT PLACEMENT  01-02-11   right   CYSTOSCOPY WITH URETEROSCOPY  09/17/2011   Procedure: CYSTOSCOPY WITH URETEROSCOPY;  Surgeon: Milford Cage, MD;  Location: [facility];  Service: Urology;  Laterality: Right;  CYSTOSCOPY, RIGHT URETEROSCOPY WITH LASER LITHO AND STENT    EXTRACORPOREAL SHOCK WAVE LITHOTRIPSY  02-05-11   right   EXTRACORPOREAL SHOCK WAVE LITHOTRIPSY Right 05/12/2020   Procedure: EXTRACORPOREAL SHOCK WAVE LITHOTRIPSY (ESWL);  Surgeon: Alfredo Martinez, MD;  Location: [facility];  Service: Urology;  Laterality: Right;   LAPAROSCOPIC APPENDECTOMY N/A 07/20/2022   Procedure: APPENDECTOMY LAPAROSCOPIC;  Surgeon: Darnell Level, MD;  Location: [facility];  Service: General;  Laterality: N/A;    Social History:   reports that he has never smoked. He has never used smokeless tobacco. He reports that he does not drink alcohol and does not use drugs.   Family History:  His family history is not on file.   Allergies: No Known Allergies   Home Medications: Prior to Admission medications   Medication  Sig Start Date End Date Taking? Authorizing Provider  acetaminophen (TYLENOL) 650 MG CR tablet Take 650 mg by mouth 2 (two) times daily as needed for pain.   Yes [provider]  atorvastatin (LIPITOR) 80 MG tablet Take 1 tablet (80 mg total) by mouth daily. 08/01/22 10/14/22 Yes Lanae Boast, MD  Calcium Carb-Cholecalciferol (CALCIUM + D3 PO) Take 1 tablet by mouth daily.   Yes [provider]  cefTRIAXone (ROCEPHIN) IVPB Inject 1 g into the vein once.   Yes [provider]  clopidogrel (PLAVIX) 75 MG tablet Take 75 mg by mouth daily. 09/08/22  Yes [provider]  levothyroxine (SYNTHROID) 50 MCG tablet Take 50 mcg by mouth daily before breakfast.   Yes [provider]  LORazepam (ATIVAN) 0.5 MG tablet Take 0.5 mg by mouth 2 (two) times daily as needed for anxiety. 08/22/22  Yes [provider]  losartan (COZAAR) 25 MG tablet Take 25 mg by mouth daily.   Yes [provider]  mirtazapine (REMERON) 7.5 MG tablet Take 7.5 mg by mouth at bedtime. 09/08/22  Yes [provider]  Multiple Vitamins-Minerals (CENTRUM SILVER 50+MEN) TABS Take 1 tablet by mouth daily.   Yes [provider]  Omega-3 Fatty Acids (FISH OIL PO) Take 1 capsule by mouth daily.   Yes [provider]  polyethylene glycol (MIRALAX / GLYCOLAX) 17 g packet Take 17 g by mouth daily.   Yes [provider]  Polyethylene Glycol 400 1 % SOLN Place 1 drop into both eyes 2 (two) times daily.   Yes [provider]  senna-docusate (SENOKOT-S) 8.6-50 MG tablet Take 2 tablets by mouth at bedtime.   Yes [provider]  TIOTROPIUM BROMIDE MONOHYDRATE IN Inhale 1 Dose into the lungs 3 (three) times daily.   Yes [provider]    Critical care time:    The patient is critically ill with multiple organ system failure and requires high complexity decision making for assessment and support, frequent evaluation and titration of  therapies, advanced monitoring, review of radiographic studies and interpretation of complex data.   Critical Care Time devoted to patient care services, exclusive of separately billable procedures, described in this note is 41 minutes.  Tim Lair, PA-C [facility]  10/08/2022 4:42 PM  Please see [email or web address] for pager details.  From 7A-7P if no response, please call [phone] After hours, please call ELink [phone]

## 2022-09-17 NOTE — Progress Notes (Signed)
I personally saw the patient and performed a substantive portion of this encounter, including a complete performance of at least one of the key components (MDM, Hx and/or Exam), in conjunction with the Advanced Practice Provider.   87 year old man of [place] origin who presented to ED from SNF for altered mental status. He had a recent prolonged hospitalization from 9/8 to 9/19 when he presented with perforated appendicitis requiring laparoscopic appendectomy.  Course was complicated by pulmonary edema improved with Lasix and left basal ganglia CVA with right-sided weakness.  He was discharged to rehab facility and when he could not participate eventually transferred to [facility]. Apparently he had failure to thrive during his stay there History is obtained from son and wife at the bedside since I could communicate in their language.  He has been unable to take much by mouth and there was concern for aspiration pneumonia.  Happily there has been GI consultation in the past and it has been decided against placing feeding tubes. Wife clearly states that patient himself and lucid had absolutely not wanted feeding tubes or advanced life support.  ED labs were significant for leukocytosis and severe hypernatremia 166 with renal failure and BUN/creatinine of 125/4.5 .  Lactate was 5.1 and increased to 5.7 After 3 L of LR, peripheral Levophed was started and his blood pressure improved.  On exam -elderly man lying in ED stretcher, male.  Awake, no urine output, moaning and intermittently moving left arm and leg, right hemiplegia, does not follow commands, pupils pinpoint, S1-S2 tacky regular, dry mucous membranes, no JVD, soft nontender abdomen, no edema.  Chest x-ray independently reviewed does not show any infiltrates or effusions  Impression/plan Septic shock versus hypovolemic shock due to severe dehydration as evidenced by hypernatremia due to failure to thrive with poor p.o. intake -Acute  encephalopathy due to above  -Empiric IV cefepime and vancomycin to cover HAP/aspiration -Use hyponatremic fluids with half-normal saline maintenance at 125 -Use peripheral Levophed -Check lactate for clearance  AKI -due to above. -Serial BMET, -Hold losartan  Goals of care -see separate discussion, DNR/I was issued , no feeding tubes. We will involve palliative care since this family will need support as a transition to full comfort care depending on his hospital course  My independent critical care time was 45 minutes  Walker Paddack V. Elsworth Soho MD

## 2022-09-17 NOTE — Consult Note (Signed)
Consultation Note Date: 09/14/2022   Patient Name: [name]  DOB: [birth year]  MRN: [id number]  Age / Sex: 87 y.o., male  PCP: Donald Prose, MD Referring Physician: Rigoberto Noel, MD  Reason for Consultation: Establishing goals of care  HPI/Patient Profile: 87 y.o. male  with past medical history of HTN, HLD, CVA (07/2022 with R-sided deficits, aphasia, dysarthria), SDH (s/p evacuation 2016), CKD with nephrolithiasis/hematuria, perforated appendicitis admitted on 10/09/2022 with altered mental status from SNF.   Patient was recently hospitalized at [facility]  9/8 - 9/19 for lethargy/AMS/fall, found to have acute gangrenous perforated appendicitis requiring laparoscopic appendectomy complicated by pulmonary edema and CVA. Currently admitted for septic shock, AKI, severe deconditioning, failure to thrive.  PMT has been consulted to assist with goals of care conversation.  Clinical Assessment and Goals of Care:  I have reviewed medical records including EPIC notes, labs and imaging, assessed the patient and then met at the bedside with patient's wife and grandson to discuss diagnosis prognosis, GOC, EOL wishes, disposition and options.  I introduced Palliative Medicine as specialized medical care for people living with serious illness. It focuses on providing relief from the symptoms and stress of a serious illness. The goal is to improve quality of life for both the patient and the family.  I attempted to elicit values and goals of care important to the patient.    Medical History Review and Understanding:  Patient's family reports a good understanding of the severity of his illness after conversations with the medical team throughout the day.   Palliative Symptoms: Agitation, constipation  Functional and Nutritional State: Patient has declined over the past few months since perforated appendicitis.   Code  Status: Concepts specific to code status, artifical feeding and hydration, and rehospitalization were considered and discussed.  Patient's family confirms wishes for DNR for patient.  Discussion: Met with patient's wife and grandson at the bedside. I explained the role of palliative care as an extra layer of support and family is appreciative.  Patient himself is mildly agitated, with restlessness of the left arm.  Created space and opportunity for patient's family's thoughts and feelings on his current illness.  Patient's grandson shares with me his understanding of their recent conversation with the medical team.  He confirms patient would not want a feeding tube or CPR.  They do not have any further questions or concerns regarding his care at this time.  I asked if there would be any other family members who would like to participate in ongoing goals of care discussions.  Patient's grandson states it would likely be just himself and patient's wife; they would then share updates with the rest of the family.  Patient does not have any disturbing symptoms at baseline that he is aware of.  I explored whether they would like to discuss the option of comfort care or meet again tomorrow to discuss further based on how he is doing.  Patient's wife indicates she is feeling very worried about him and would prefer to wait.  Emotional support and therapeutic listening was provided.  PMT contact information was provided.   The difference between aggressive medical intervention and comfort care was considered in light of the patient's goals of care. Hospice and Palliative Care services outpatient were explained and offered.   Discussed the importance of continued conversation with family and the medical providers regarding overall plan of care and treatment options, ensuring decisions are within the context of the patient's values and GOCs.  Questions and concerns were addressed.  Hard Choices booklet left for  review. The family was encouraged to call with questions or concerns.  PMT will continue to support holistically.   SUMMARY OF RECOMMENDATIONS   -DNR confirmed -Continue full scope treatment -Patient's family confirms he would not want a feeding tube or prolonged suffering -Ongoing goals of care discussions pending clinical course -PMT will continue to follow and support  Prognosis:  Unable to determine  Discharge Planning: To Be Determined      Primary Diagnoses: Present on Admission:  Septic shock ([place])  Encephalopathy  Sepsis ([place])   Physical Exam Vitals and nursing note reviewed.  Constitutional:      General: He is in acute distress.     Appearance: He is ill-appearing.     Interventions: Nasal cannula in place.     Comments: 6L  Cardiovascular:     Rate and Rhythm: Tachycardia present.  Pulmonary:     Effort: Tachypnea present.  Neurological:     Mental Status: He is alert. He is disoriented.  Psychiatric:        Behavior: Behavior is agitated.    Vital Signs: BP 104/68   Pulse (!) 108   Temp 97.8 F (36.6 C) (Rectal)   Resp (!) 26   SpO2 98%  Pain Scale: 0-10   Pain Score: Asleep  SpO2: SpO2: 98 % O2 Device:SpO2: 98 % O2 Flow Rate: .O2 Flow Rate (L/min): 6 L/min  Palliative Assessment/Data:     MDM: high    Joleen Stuckert Johnnette Litter, PA-C  Palliative Medicine Team Team phone # [phone]  Thank you for allowing the Palliative Medicine Team to assist in the care of this patient. Please utilize secure chat with additional questions, if there is no response within 30 minutes please call the above phone number.  Palliative Medicine Team providers are available by phone from 7am to 7pm daily and can be reached through the team cell phone.  Should this patient require assistance outside of these hours, please call the patient's attending physician.

## 2022-09-17 NOTE — Sepsis Progress Note (Signed)
Sepsis protocol is followed by eLink.

## 2022-09-17 NOTE — ED Notes (Signed)
MD aware of Lactic and sodium level

## 2022-09-17 NOTE — ED Notes (Signed)
CCM at the bedside. °

## 2022-09-17 NOTE — IPAL (Signed)
  Interdisciplinary Goals of Care Family Meeting   Date carried out:: 09/25/2022  Location of the meeting:  ED  Member's involved: Physician, Nurse Practitioner, and Family Member or next of kin Wife and grandson, I also spoke to patient's son on the phone in niece [place]  Durable Power of Forensic psychologist or Loss adjuster, chartered: Wife  Discussion: We discussed goals of care for [employer] .  Wife expressed that when he was lucid he had clearly indicated that he did not want any kind of feeding tube.  He also did not want life support and DNR/I had been issued.  She was agreeable to reinstating DNR/I.  She was also agreeable to honoring his wishes as stated earlier regarding no feeding tube.  I further communicated this decision to patient's son who was accepting.  They would like to avoid further suffering for him.  They were willing to use pressors for short.  To allow family members to gather  Code status: Full DNR  Disposition: In-patient comfort care   Time spent for the meeting: 30 minutes  Kyana Aicher V. Tiffiny Worthy 10/01/2022, 4:36 PM

## 2022-09-17 NOTE — ED Triage Notes (Signed)
Pt bib ems from [facility] with reports of AMS. Staff reports at 0700 they found pt altered and not responding. On scene pt with GCS 3. Pt given 500cc NS en route. Pt BP 70/50. Arrives on NRB with saturations of 96%. Pt RR 50.

## 2022-09-17 NOTE — Progress Notes (Signed)
An USGPIV (ultrasound guided PIV) has been placed for short-term vasopressor infusion. A correctly placed ivWatch must be used when administering Vasopressors. Should this treatment be needed beyond 72 hours, central line access should be obtained.  It will be the responsibility of the bedside nurse to follow best practice to prevent extravasations.   ?

## 2022-09-18 ENCOUNTER — Inpatient Hospital Stay (HOSPITAL_COMMUNITY): Payer: Medicare Other

## 2022-09-18 DIAGNOSIS — Z515 Encounter for palliative care: Secondary | ICD-10-CM

## 2022-09-18 DIAGNOSIS — G9340 Encephalopathy, unspecified: Secondary | ICD-10-CM | POA: Diagnosis not present

## 2022-09-18 DIAGNOSIS — A419 Sepsis, unspecified organism: Secondary | ICD-10-CM | POA: Diagnosis not present

## 2022-09-18 DIAGNOSIS — R6521 Severe sepsis with septic shock: Secondary | ICD-10-CM | POA: Diagnosis not present

## 2022-09-18 DIAGNOSIS — R627 Adult failure to thrive: Secondary | ICD-10-CM

## 2022-09-18 DIAGNOSIS — N179 Acute kidney failure, unspecified: Secondary | ICD-10-CM | POA: Diagnosis not present

## 2022-09-18 LAB — BASIC METABOLIC PANEL
Anion gap: 20 — ABNORMAL HIGH (ref 5–15)
Anion gap: 8 (ref 5–15)
Anion gap: 10 (ref 5–15)
BUN: 106 mg/dL — ABNORMAL HIGH (ref 8–23)
BUN: 97 mg/dL — ABNORMAL HIGH (ref 8–23)
BUN: 93 mg/dL — ABNORMAL HIGH (ref 8–23)
CO2: 21 mmol/L — ABNORMAL LOW (ref 22–32)
CO2: 24 mmol/L (ref 22–32)
CO2: 24 mmol/L (ref 22–32)
Calcium: 8.8 mg/dL — ABNORMAL LOW (ref 8.9–10.3)
Calcium: 8.1 mg/dL — ABNORMAL LOW (ref 8.9–10.3)
Calcium: 8.2 mg/dL — ABNORMAL LOW (ref 8.9–10.3)
Chloride: 120 mmol/L — ABNORMAL HIGH (ref 98–111)
Chloride: 122 mmol/L — ABNORMAL HIGH (ref 98–111)
Chloride: 122 mmol/L — ABNORMAL HIGH (ref 98–111)
Creatinine, Ser: 3.33 mg/dL — ABNORMAL HIGH (ref 0.61–1.24)
Creatinine, Ser: 2.88 mg/dL — ABNORMAL HIGH (ref 0.61–1.24)
Creatinine, Ser: 2.62 mg/dL — ABNORMAL HIGH (ref 0.61–1.24)
GFR, Estimated: 17 mL/min — ABNORMAL LOW (ref >60)
GFR, Estimated: 21 mL/min — ABNORMAL LOW (ref >60)
GFR, Estimated: 23 mL/min — ABNORMAL LOW (ref >60)
Glucose, Bld: 149 mg/dL — ABNORMAL HIGH (ref 70–99)
Glucose, Bld: 221 mg/dL — ABNORMAL HIGH (ref 70–99)
Glucose, Bld: 255 mg/dL — ABNORMAL HIGH (ref 70–99)
Potassium: 4.2 mmol/L (ref 3.5–5.1)
Potassium: 3.7 mmol/L (ref 3.5–5.1)
Potassium: 3.8 mmol/L (ref 3.5–5.1)
Sodium: 161 mmol/L (ref 135–145)
Sodium: 154 mmol/L — ABNORMAL HIGH (ref 135–145)
Sodium: 156 mmol/L — ABNORMAL HIGH (ref 135–145)

## 2022-09-18 LAB — HEPATIC FUNCTION PANEL
ALT: 49 U/L — ABNORMAL HIGH (ref 0–44)
AST: 54 U/L — ABNORMAL HIGH (ref 15–41)
Albumin: 1.9 g/dL — ABNORMAL LOW (ref 3.5–5.0)
Alkaline Phosphatase: 77 U/L (ref 38–126)
Bilirubin, Direct: 0.1 mg/dL (ref 0.0–0.2)
Indirect Bilirubin: 0.5 mg/dL (ref 0.3–0.9)
Total Bilirubin: 0.6 mg/dL (ref 0.3–1.2)
Total Protein: 5.1 g/dL — ABNORMAL LOW (ref 6.5–8.1)

## 2022-09-18 LAB — PROCALCITONIN: Procalcitonin: 0.65 ng/mL

## 2022-09-18 LAB — CBC
HCT: 43.3 % (ref 39.0–52.0)
Hemoglobin: 13.5 g/dL (ref 13.0–17.0)
MCH: 31.9 pg (ref 26.0–34.0)
MCHC: 31.2 g/dL (ref 30.0–36.0)
MCV: 102.4 fL — ABNORMAL HIGH (ref 80.0–100.0)
Platelets: 127 10*3/uL — ABNORMAL LOW (ref 150–400)
RBC: 4.23 MIL/uL (ref 4.22–5.81)
RDW: 13.7 % (ref 11.5–15.5)
WBC: 22.3 10*3/uL — ABNORMAL HIGH (ref 4.0–10.5)
nRBC: 0 % (ref 0.0–0.2)

## 2022-09-18 LAB — PHOSPHORUS: Phosphorus: 3.9 mg/dL (ref 2.5–4.6)

## 2022-09-18 LAB — GLUCOSE, CAPILLARY
Glucose-Capillary: 217 mg/dL — ABNORMAL HIGH (ref 70–99)
Glucose-Capillary: 161 mg/dL — ABNORMAL HIGH (ref 70–99)
Glucose-Capillary: 214 mg/dL — ABNORMAL HIGH (ref 70–99)

## 2022-09-18 LAB — LACTIC ACID, PLASMA: Lactic Acid, Venous: 2.8 mmol/L (ref 0.5–1.9)

## 2022-09-18 LAB — MAGNESIUM: Magnesium: 2.1 mg/dL (ref 1.7–2.4)

## 2022-09-18 MED ORDER — ONDANSETRON 4 MG PO TBDP: 4.0000 mg | ORAL_TABLET | Freq: Four times a day (QID) | ORAL | Status: DC | PRN

## 2022-09-18 MED ORDER — BISACODYL 10 MG RE SUPP: 10.0000 mg | Freq: Every day | RECTAL | Status: DC | PRN

## 2022-09-18 MED ORDER — LORAZEPAM 2 MG/ML IJ SOLN: 1.0000 mg | INTRAMUSCULAR | Status: DC | PRN

## 2022-09-18 MED ORDER — MORPHINE SULFATE (PF) 2 MG/ML IV SOLN
2.0000 mg | INTRAVENOUS | Status: DC | PRN
  Administered 2022-09-18 – 2022-09-19 (×5): 2 mg via INTRAVENOUS
  Filled 2022-09-18 (×5): qty 1

## 2022-09-18 MED ORDER — GLYCOPYRROLATE 1 MG PO TABS: 1.0000 mg | ORAL_TABLET | ORAL | Status: DC | PRN

## 2022-09-18 MED ORDER — GLYCOPYRROLATE 0.2 MG/ML IJ SOLN: 0.2000 mg | INTRAMUSCULAR | Status: DC | PRN

## 2022-09-18 MED ORDER — POLYVINYL ALCOHOL 1.4 % OP SOLN: 1.0000 [drp] | Freq: Four times a day (QID) | OPHTHALMIC | Status: DC | PRN

## 2022-09-18 MED ORDER — SODIUM CHLORIDE 0.9 % IV SOLN: 2.0000 g | INTRAVENOUS | Status: DC

## 2022-09-18 MED ORDER — DEXTROSE 5 % IV SOLN: INTRAVENOUS | Status: DC

## 2022-09-18 MED ORDER — ACETAMINOPHEN 650 MG RE SUPP: 650.0000 mg | Freq: Four times a day (QID) | RECTAL | Status: DC | PRN

## 2022-09-18 MED ORDER — ONDANSETRON HCL 4 MG/2ML IJ SOLN: 4.0000 mg | Freq: Four times a day (QID) | INTRAMUSCULAR | Status: DC | PRN

## 2022-09-18 MED ORDER — ACETAMINOPHEN 325 MG PO TABS: 650.0000 mg | ORAL_TABLET | Freq: Four times a day (QID) | ORAL | Status: DC | PRN

## 2022-09-18 MED ORDER — BIOTENE DRY MOUTH MT LIQD: 15.0000 mL | OROMUCOSAL | Status: DC | PRN

## 2022-09-18 MED ORDER — FREE WATER: 200.0000 mL | Status: DC

## 2022-09-18 NOTE — Progress Notes (Signed)
PHARMACY NOTE:  ANTIMICROBIAL RENAL DOSAGE ADJUSTMENT  Current antimicrobial regimen includes a mismatch between antimicrobial dosage and estimated renal function.  As per policy approved by the Pharmacy & Therapeutics and Medical Executive Committees, the antimicrobial dosage will be adjusted accordingly.  Current antimicrobial dosage:  Cefepime 1g Q24H   Indication: Sepsis   Renal Function:  Estimated Creatinine Clearance: 15.1 mL/min (A) (by C-G formula based on SCr of 2.88 mg/dL (H)).  Antimicrobial dosage has been changed to:  Cefepime 2g Q24H, AKI has improved CrCl 15.23mL/min increased as CrCl > 10 mL/min in setting of septic shock   Thank you for allowing pharmacy to be a part of this patient's care.  Ventura Sellers, [facility] 09/18/2022 8:44 AM

## 2022-09-18 NOTE — Progress Notes (Signed)
eLink Physician-Brief Progress Note Patient Name: Clemmie Marxen DOB: [birth year] MRN: [id number]   Date of Service  09/18/2022  HPI/Events of Note  Family indicates that they do not want him to have an NG tube.  eICU Interventions  NG tube order discontinued.        Kerry Kass Alayziah Tangeman 09/18/2022, 3:35 AM

## 2022-09-18 NOTE — Progress Notes (Signed)
eLink Physician-Brief Progress Note Patient Name: Unknown Schleyer DOB: July 05, 1935 MRN: [id number]   Date of Service  09/18/2022  HPI/Events of Note  Patient with serum sodium of 161 secondary to severe volume depletion.  eICU Interventions  D 5 % water gtt via iv + liberal free water via enteral feeding tube.        Kerry Kass Gryphon Vanderveen 09/18/2022, 2:37 AM

## 2022-09-18 NOTE — Progress Notes (Signed)
NAME:  [name], MRN:  [id number], DOB:  [birth year], LOS: 1 ADMISSION DATE:  09/24/2022 CONSULTATION DATE:  10/04/2022 REFERRING MD:  Ronnald Nian - EDP CHIEF COMPLAINT:  AMS, hypotension   History of Present Illness:  87 year old man who presented to [facility] ED from SNF 11/6 via EMS for AMS. Report of poor respiratory status overnight progressing to confusion; recent falls per facility. 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 Recently hospitalized at [facility]  9/8 - 9/19 for lethargy/AMS/fall, found to have acute gangrenous perforated appendicitis requiring laparoscopic appendectomy c/b pulmonary edema and CVA (L lentiform nucleus, noted on MRI 9/14). Patient was discharged to rehab/SNF and then transferred to [facility].  History is obtained from patient's grandson at bedside.  He states that patient was admitted to the hospital 07/2022 for appendicitis; he underwent laparoscopic appendectomy for this and per grandson patient's condition has been slowly deteriorating since.  He initially tolerated surgery well, however his immediate postoperative course was complicated by pulmonary edema and subsequently CVA with residual right-sided deficits.  He was discharged to SNF for rehabilitation and once completing this/maximizing benefit was transferred to [facility].  Patient's grandson states that at his baseline he was conversant and able to do some things on his own; since surgery/CVA he has been fully dependent without use of his right side and ongoing aphasia/dysarthria.  Additionally, patient has struggled with constipation and decreased urine output.  He has not been eating or drinking well with significantly decreased PO intake for several weeks.  Patient's wife, at bedside, initially wanted to pursue full code status for patient (despite signed gold form at bedside) however  on further discussion opted for DNR as this was more in line with patient's previously discussed wishes.  On arrival to ED, patient was intermittently restless; afebrile with Tmax 97.45F, tachycardic to 120s, hypotension with SBP 86/46, RR 29, SpO2 94% on NRB. NS 536mL given by EMS. Labs were notable for WBC 19, H&H/Plt WNL, INR 1.4 (on Plavix), Na 166, K 4.7, Cl 128, CO2 23, Cr 4.53/BUN 125, glucose 214. Mild transaminitis with AST 46, ALT 54. LA 5.7 (5.1). CT Head NAICA, CT C-spine with no acute fracture, degenerative changes. CXR likely atelectasis. Broad-spectrum cefepime/vanc were initiated and Blood Cx/urine Cx sent. Levophed started for pressor support.  PCCM consulted for ICU admission.  Pertinent Medical History:   Past Medical History:  Diagnosis Date   Chronic kidney disease    left nephrolithiasis   Hematuria    ureteral stone and stent   Hyperlipidemia    Hypertension    Right ureteral stone    Subdural hematoma ([place]) 02/12/2015   Significant Hospital Events: Including procedures, antibiotic start and stop dates in addition to other pertinent events   11/6 Presented to [facility] ED via EMS from [facility] for [place]. Concern for sepsis. Post-stroke with residual R-sided deficits. CT Head/Cspine negative. Bcx pending. UA/UCx unable to obtain. DNR paperwork signed and at bedside, d/w patient's wife and this will be upheld. OK for pressors/abx/fluids. Levophed started in ED.  Interim History / Subjective:  Agitated last night and got 2mg  haldol, now resting with intermittent tachypnea.  Objective:  Blood pressure 108/79, pulse (!) 120, temperature 97.9 F (36.6 C), temperature source Axillary, resp. rate 19, weight 59.6 kg, SpO2 92 %.        Intake/Output Summary (Last 24 hours) at 09/18/2022 0529 Last data filed at 09/18/2022 0412 Gross per  24 hour  Intake 1633.82 ml  Output 575 ml  Net 1058.82 ml   Filed Weights   10/05/2022 2028  Weight: 59.6 kg    Physical  Examination: Chronically ill appearing man breathing in 30s MM dry Shallow inspiratory efforts Defer neuro exam and will let him rest Ext warm  Sodium up Afebrile Sugars ok  Resolved Hospital Problem List:    Assessment & Plan:  Septic shock with unknown source Lactic acidosis 5.1 on admission Altered mental status, likely multifactorial in the setting of sepsis, hypernatremia and recent CVA Recent falls CT Head 11/6 NAICA. CT C-spine with degenerative changes. Na 166 on admission. CVA of L lentiform nucleus with R-sided deficits- MRI 9/14 with small acute infarcts in the left lentiform nucleus without hemorrhage or mass effect. CT Head 9/14 NAICA, stable atrophy and chronic small vessel ischemic changes. CTA Head/Neck 9/14 negative for LVO. History of SDH (2016) History of perforated appendicitis, s/p laparoscopic appendectomy Admitted 9/8 - 9/19 for perforated/gangrenous appendicitis. S/p lap appendectomy. Initial postop course c/b pulmonary edema. AKI on CKD in the setting of severe dehydration/hypotension, prerenal Hypernatremia Na 166 on admission. Hypertension Hyperlipidemia Hypothyroidism At risk for malnutrition Severe physical deconditioning  Overall appears to be nearing EOL Family does not want feeding tube Levophed to MAP 65 Continue abx, f/u culture data D5w, follow BMP More family arriving this afternoon and will discuss GOC, anticipate transition to comfort  Best Practice: (right click and "Reselect all SmartList Selections" daily)   Diet/type: NPO DVT prophylaxis: prophylactic heparin  GI prophylaxis: N/A Lines: N/A Foley:  N/A Code Status:  DNR - no intubation/PEG, ok for pressors/antibiotics/fluids; low threshold to transition to full comfort care if patient begins to deteriorate/struggle Last date of multidisciplinary goals of care discussion [11/6 Dr. Vassie Loll discussed with patient's wife and grandson at bedside in ED]  34 min cc time  Lorin Glass, MD [facility] 09/18/22 5:29 AM  Please see [email or web address] for pager details.  From 7A-7P if no response, please call [phone] After hours, please call ELink [phone]

## 2022-09-18 NOTE — Progress Notes (Signed)
Daily Progress Note   Patient Name: [name]       Date: 09/18/2022 DOB: 04-27-1935  Age: 87 y.o. MRN#: [id number] Attending Physician: Candee Furbish, MD Primary Care Physician: Donald Prose, MD Admit Date: 09/22/2022  Reason for Consultation/Follow-up: Establishing goals of care  Subjective: Medical records reviewed including progress notes, labs. Patient assessed at the bedside.  He is sleeping comfortably.  Discussed with RN.  He has several family members present visiting.  Created space and opportunity for patient family's thoughts and feelings on his current illness.  We discussed his night including an episode of agitation and management strategies to ensure comfort.  They are all at peace with their decision to transition to comfort focused care.  They agree this is what he would want given his poor quality of life since his stroke.  We discussed the option of starting with low doses of as needed medications, ongoing assessments, using scheduled comfort medications and then continuous infusions if necessary.  The family verbalized their understanding.  They are agreeable to the addition of as needed Ativan for agitation and would like to give a dose of morphine at this time.  Explored family's thoughts on transfer to residential hospice facility and explained the referral process.  They would prefer for the patient to remain in the hospital until he passes.  Family has questions regarding patient's transition to the morgue after death and then outside funeral home/cremation service.  He would like to be cremated and they wonder whether insurance will cover this.  Education was provided and family was encouraged to discuss financial assistance with their facility of choice in coordination  with the morgue.  Questions and concerns addressed.  PMT contact information was provided.  PMT will continue to support holistically.   Length of Stay: 1  Physical Exam Vitals and nursing note reviewed.            Vital Signs: BP 108/79 (BP Location: Right Arm)   Pulse (!) 120   Temp 98.1 F (36.7 C) (Axillary)   Resp 19   Wt 59.6 kg   SpO2 92%   BMI 23.28 kg/m  SpO2: SpO2: 92 % O2 Device: O2 Device: Room Air O2 Flow Rate: O2 Flow Rate (L/min): 6 L/min      Palliative Assessment/Data: 10%  Palliative Care Assessment & Plan   Patient Profile: 87 y.o. 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    Patient was recently hospitalized at [facility]  9/8 - 9/19 for lethargy/AMS/fall, found to have acute gangrenous perforated appendicitis requiring laparoscopic appendectomy complicated by pulmonary edema and CVA. Currently admitted for septic shock, AKI, severe deconditioning, failure to thrive.   PMT has been consulted to assist with goals of care conversation.  Assessment: End-of-life care  Recommendations/Plan: Continue DNR Family confirms readiness for transition to comfort care today, agree with orders placed by PCCM Will also order Ativan 1 mg IV every 2 hours PRN for agitation/anxiety/dyspnea, Zofran PRN for nausea and bisacodyl PRN for constipation Family declines transfer to residential hospice PMT will continue to follow and support   Prognosis:  Hours - Days  Discharge Planning: Anticipated Hospital Death  Care plan was discussed with Karle Barr, NP, patient's family, RN   MDM high        Violanda Bobeck Jeni Salles, PA-C  Palliative Medicine Team Team phone # [phone]  Thank you for allowing the Palliative Medicine Team to assist in the care of this  patient. Please utilize secure chat with additional questions, if there is no response within 30 minutes please call the above phone number.  Palliative Medicine Team providers are available by phone from 7am to 7pm daily and can be reached through the team cell phone.  Should this patient require assistance outside of these hours, please call the patient's attending physician.

## 2022-09-18 NOTE — Progress Notes (Signed)
eLink Physician-Brief Progress Note Patient Name: [name] DOB: 1935/09/15 MRN: [id number]   Date of Service  09/18/2022  HPI/Events of Note  Serum sodium 161.  eICU Interventions  Intravenous fluid  gtt changed to D 5 % water at 125 ml / hour, NG tube ordered, free water ordered via enteral tube at 200 ml Q 2 hours x 24 hours. BMP Q 6 hours x 4.        Kerry Kass Kayleeann Huxford 09/18/2022, 2:46 AM

## 2022-09-18 NOTE — IPAL (Signed)
  Interdisciplinary Goals of Care Family Meeting   Date carried out:: 09/18/2022  Location of the meeting: Bedside  Member's involved: Nurse Practitioner, Bedside Registered Nurse, and Family Member or next of kin,-[place], [place], [place], Vinesh, Sharik Slane  Durable Power of [employer] or Loss adjuster, chartered: Wife, son, and grandson present  Discussion: We discussed goals of care for [employer] .  We discussed Mr. Lyttle's past year including his hospitalization in September, 2023 and worsening medical status after the fact.  Mr. Ribaudo was independent prior to his stroke, now he is unable to take care of himself, eat, and communicate.  I explained to the family that this is likely Mr. Womac letting us know that he is at the end of his life.  I recommended that we focus on comfort in the situation and allow for family to visit.  We discussed stopping life support measures and choosing to focus on comfortable care.  The whole family was agreeable to this course of action.  Comfort care orders placed.  Palliative care team notified.  Code status: Full DNR  Disposition: In-patient comfort care   Time spent for the meeting/care cordination: 09:49 to 10:03- 14 minutes  Estill Cotta 09/18/2022, 10:03 AM

## 2022-09-19 DIAGNOSIS — A419 Sepsis, unspecified organism: Secondary | ICD-10-CM | POA: Diagnosis not present

## 2022-09-19 DIAGNOSIS — R6521 Severe sepsis with septic shock: Secondary | ICD-10-CM | POA: Diagnosis not present

## 2022-09-19 MED ORDER — MORPHINE 100MG IN NS 100ML (1MG/ML) PREMIX INFUSION
0.0000 mg/h | INTRAVENOUS | Status: DC
  Administered 2022-09-19: 5 mg/h via INTRAVENOUS
  Filled 2022-09-19: qty 100

## 2022-09-19 MED ORDER — MORPHINE BOLUS VIA INFUSION
5.0000 mg | INTRAVENOUS | Status: DC | PRN
  Administered 2022-09-19: 5 mg via INTRAVENOUS

## 2022-09-22 LAB — CULTURE, BLOOD (ROUTINE X 2)
Culture: NO GROWTH
Culture: NO GROWTH

## 2022-09-24 ENCOUNTER — Ambulatory Visit: Payer: Medicare Other | Admitting: Neurology

## 2022-10-12 NOTE — Death Summary Note (Signed)
Death Summary  [name] [id number] DOB: 1935/08/07 DOA: 10/06/2022  PCP: Donald Prose, MD  Admit date: 10/06/2022 Date of Death: 2022/10/08 Time of Death: 18:12 Notification: Donald Prose, MD notified of death of 10/08/2022  Brief Narrative / Interim history: 87 year old man who comes in from his SNF on 07-Oct-2023 for altered mental status, worsening respiratory status.  He has a history of HTN, HLD, recent CVA in September 2023 with right-sided deficits, aphasia, dysarthria, history of SDH s/p evacuation 2016, CKD, recent hospitalization for acute gangrenous perforated appendicitis.  Following this most recent hospitalization he has had a progressive decline, struggled with ongoing aphasia/dysarthria, poor p.o. intake.  He was initially admitted to the ICU as he was required pressors, after goals of care discussions he was transitioned to comfort care  Final Diagnoses:  Goals of care-full comfort now. DNR. 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    Significant Diagnostic Studies: DG Chest Port 1 View  Result Date: 09/18/2022 CLINICAL DATA:  Hypoxia EXAM: PORTABLE CHEST 1 VIEW COMPARISON:  Portable exam at 0542 hrs compared to 06-Oct-2022 FINDINGS: Enlargement of cardiac silhouette. Tortuous aorta. Chronic elevation of RIGHT diaphragm. Bibasilar atelectasis. Mild patchy infiltrates in RIGHT upper lobe.  No pleural effusion or pneumothorax. IMPRESSION: Bibasilar atelectasis with mild patchy infiltrate in RIGHT upper lobe. Electronically Signed   By: Lavonia Dana M.D.   On: 09/18/2022 09:18   CT HEAD WO CONTRAST (5MM)  Result Date: 10/06/2022 CLINICAL DATA:  Mental status change, unknown cause EXAM: CT HEAD WITHOUT CONTRAST TECHNIQUE: Contiguous axial images were obtained from the base of the skull through the vertex without intravenous contrast. RADIATION DOSE REDUCTION: This exam was performed according to the departmental dose-optimization program which includes automated exposure control, adjustment of the mA and/or kV according to patient size and/or use of iterative reconstruction technique. COMPARISON:  07/26/2022 FINDINGS: Brain: There is atrophy and chronic small vessel disease changes. No acute intracranial abnormality. Specifically, no hemorrhage, hydrocephalus, mass lesion, acute infarction, or significant intracranial injury. Vascular: No hyperdense vessel or unexpected calcification. Skull: No acute calvarial abnormality. Sinuses/Orbits: No acute findings Other: None IMPRESSION: Atrophy, chronic microvascular disease. No acute intracranial abnormality. Electronically Signed   By: Rolm Baptise M.D.   On: Oct 06, 2022 14:21   CT Cervical Spine Wo Contrast  Result Date: 10-06-22 CLINICAL DATA:  Neck trauma (Age >= 65y) EXAM: CT CERVICAL SPINE WITHOUT CONTRAST TECHNIQUE: Multidetector CT imaging of the cervical spine was performed without intravenous contrast. Multiplanar CT image reconstructions were also generated. RADIATION DOSE REDUCTION: This exam was performed according to the departmental dose-optimization program which includes automated exposure control, adjustment of the mA and/or kV according to patient size and/or use of iterative reconstruction technique. COMPARISON:  None Available. FINDINGS: Alignment: 3 mm degenerative anterolisthesis of C5 on C6. Skull base and vertebrae: No acute  fracture. No primary bone lesion or focal pathologic process. Soft tissues and spinal canal: No prevertebral fluid or swelling. No visible canal hematoma. Disc levels: Degenerative disc disease from C5-6 through C6-7. Bilateral degenerative facet disease diffusely. Upper chest: No acute findings Other:  None IMPRESSION: Degenerative changes.  No acute bony abnormality. Electronically Signed   By: Rolm Baptise M.D.   On: 09/12/2022 14:19   DG Chest Port 1 View  Result Date: 09/21/2022 CLINICAL DATA:  Possible sepsis EXAM: PORTABLE CHEST 1 VIEW COMPARISON:  Previous studies including the examination done on 07/24/2022 FINDINGS: Transverse diameter of heart is increased. There are no signs of pulmonary edema. Increased markings are seen in the lower lung fields. There is poor inspiration. There is no focal pulmonary consolidation. Left lateral CP angle is not included in the radiograph. Right lateral CP angle is clear. There is no pneumothorax. IMPRESSION: There are no signs of pulmonary edema. Increased interstitial markings in the lower lung fields may be due to crowding of bronchovascular structures caused by poor inspiration or subsegmental atelectasis. Electronically Signed   By: Elmer Picker M.D.   On: 10/04/2022 13:18    Microbiology: Recent Results (from the past 240 hour(s))  Blood Culture (routine x 2)     Status: None (Preliminary result)   Collection Time: 09/18/2022 12:05 PM   Specimen: BLOOD LEFT HAND  Result Value Ref Range Status   Specimen Description BLOOD LEFT HAND  Final   Special Requests   Final    BOTTLES DRAWN AEROBIC AND ANAEROBIC Blood Culture results may not be optimal due to an inadequate volume of blood received in culture bottles   Culture   Final    NO GROWTH 2 DAYS Performed at [facility], [place] [address] [place], [place] [address]    Report Status PENDING  Incomplete  Blood Culture (routine x 2)     Status: None (Preliminary result)   Collection Time:  10/01/2022 12:15 PM   Specimen: BLOOD RIGHT HAND  Result Value Ref Range Status   Specimen Description BLOOD RIGHT HAND  Final   Special Requests   Final    BOTTLES DRAWN AEROBIC AND ANAEROBIC Blood Culture results may not be optimal due to an inadequate volume of blood received in culture bottles   Culture   Final    NO GROWTH 2 DAYS Performed at [facility], [place] [address] [place], [place] [address]    Report Status PENDING  Incomplete  Resp Panel by RT-PCR (Flu A&B, Covid) Anterior Nasal Swab     Status: None   Collection Time: 09/15/2022  4:50 PM   Specimen: Anterior Nasal Swab  Result Value Ref Range Status   SARS Coronavirus 2 by RT PCR NEGATIVE NEGATIVE Final    Comment: (NOTE) SARS-CoV-2 target nucleic acids are NOT DETECTED.  The SARS-CoV-2 RNA is generally detectable in upper respiratory specimens during the acute phase of infection. The lowest concentration of SARS-CoV-2 viral copies this assay can detect is 138 copies/mL. A negative result does not preclude SARS-Cov-2 infection and should not be used as the sole basis for treatment or other patient management decisions. A negative result may occur with  improper specimen collection/handling, submission of specimen other than nasopharyngeal swab, presence of viral mutation(s) within the areas targeted by this assay, and inadequate number of viral copies(<138 copies/mL). A negative result must be combined with clinical observations, patient history, and epidemiological information. The expected result is Negative.  Fact Sheet for Patients:  EntrepreneurPulse.com.au  Fact Sheet for Healthcare Providers:  IncredibleEmployment.be  This test is no t yet approved or cleared by the [place] FDA and  has been authorized for detection and/or diagnosis of SARS-CoV-2 by FDA under an Emergency Use Authorization (EUA).  This EUA will remain  in effect (meaning this test can be used)  for the duration of the COVID-19 declaration under Section 564(b)(1) of the Act, 21 U.S.C.section 360bbb-3(b)(1), unless the authorization is terminated  or revoked sooner.       Influenza A by PCR NEGATIVE NEGATIVE Final   Influenza B by PCR NEGATIVE NEGATIVE Final    Comment: (NOTE) The Xpert Xpress SARS-CoV-2/FLU/RSV plus assay is intended as an aid in the diagnosis of influenza from Nasopharyngeal swab specimens and should not be used as a sole basis for treatment. Nasal washings and aspirates are unacceptable for Xpert Xpress SARS-CoV-2/FLU/RSV testing.  Fact Sheet for Patients: EntrepreneurPulse.com.au  Fact Sheet for Healthcare Providers: IncredibleEmployment.be  This test is not yet approved or cleared by the [place] FDA and has been authorized for detection and/or diagnosis of SARS-CoV-2 by FDA under an Emergency Use Authorization (EUA). This EUA will remain in effect (meaning this test can be used) for the duration of the COVID-19 declaration under Section 564(b)(1) of the Act, 21 U.S.C. section 360bbb-3(b)(1), unless the authorization is terminated or revoked.  Performed at [facility], [place] [address] [place], [place] [address]   MRSA Next Gen by PCR, Nasal     Status: None   Collection Time: 09/16/2022 10:09 PM   Specimen: Nasal Mucosa; Nasal Swab  Result Value Ref Range Status   MRSA by PCR Next Gen NOT DETECTED NOT DETECTED Final    Comment: (NOTE) The GeneXpert MRSA Assay (FDA approved for NASAL specimens only), is one component of a comprehensive MRSA colonization surveillance program. It is not intended to diagnose MRSA infection nor to guide or monitor treatment for MRSA infections. Test performance is not FDA approved in patients less than 25 years old. Performed at [facility], [place] [address] [place], [place] [address]      Labs: Basic Metabolic Panel: Recent Labs  Lab 09/15/2022 1722  10/01/2022 2104 09/18/22 0048 09/18/22 0558 09/18/22 0900  NA 162* 159* 161* 154* 156*  K 4.3 4.3 4.2 3.7 3.8  CL 123* 124* 120* 122* 122*  CO2 23 18* 21* 24 24  GLUCOSE 188* 212* 149* 221* 255*  BUN 112* 111* 106* 97* 93*  CREATININE 3.83* 3.68* 3.33* 2.88* 2.62*  CALCIUM 8.6* 8.5* 8.8* 8.1* 8.2*  MG  --   --   --  2.1  --   PHOS  --   --   --  3.9  --    Liver Function Tests: Recent Labs  Lab 09/26/2022 1215 09/18/22 0558  AST 46* 54*  ALT 54* 49*  ALKPHOS 88 77  BILITOT 0.8 0.6  PROT 5.9* 5.1*  ALBUMIN 2.2* 1.9*   No results for input(s): "LIPASE", "AMYLASE" in the last 168 hours. No results for input(s): "AMMONIA" in the last 168 hours. CBC: Recent Labs  Lab 09/29/2022 1215 09/21/2022 1240 10/10/2022 1241 09/18/22 0558  WBC 19.0*  --   --  22.3*  NEUTROABS 14.7*  --   --   --   HGB 15.2 15.0 14.3 13.5  HCT 49.6 44.0 42.0 43.3  MCV 104.2*  --   --  102.4*  PLT 168  --   --  127*   Cardiac Enzymes: No results for input(s): "CKTOTAL", "CKMB", "CKMBINDEX", "TROPONINI" in the last 168 hours. D-Dimer No results for input(s): "DDIMER" in the last 72 hours. BNP: Invalid input(s): "POCBNP" CBG: Recent Labs  Lab 10/03/2022 1651 09/12/2022 2022 10/08/2022 2326 09/18/22 0323 09/18/22  0747  GLUCAP 171* 217* 139* 161* 214*   Anemia work up No results for input(s): "VITAMINB12", "FOLATE", "FERRITIN", "TIBC", "IRON", "RETICCTPCT" in the last 72 hours. Urinalysis    Component Value Date/Time   COLORURINE YELLOW 18-Sep-2022 1835   APPEARANCEUR HAZY (A) 2022/09/18 1835   LABSPEC 1.016 Sep 18, 2022 1835   PHURINE 5.0 September 18, 2022 1835   GLUCOSEU NEGATIVE 09/18/2022 1835   HGBUR MODERATE (A) 09/18/2022 1835   BILIRUBINUR NEGATIVE 09/18/2022 1835   KETONESUR NEGATIVE 09-18-2022 1835   PROTEINUR 30 (A) 09/18/22 1835   UROBILINOGEN 0.2 02/12/2015 1804   NITRITE NEGATIVE 2022/09/18 1835   LEUKOCYTESUR NEGATIVE 2022-09-18 1835   Sepsis Labs Recent Labs  Lab September 18, 2022 1215  09/18/22 0558  WBC 19.0* 22.3*       SIGNED:  Pamella Pert, MD  [facility] 10/11/2022, 6:37 PM Pager   If 7PM-7AM, please contact night-coverage [email or web address] Password [email or web address]

## 2022-10-12 NOTE — Progress Notes (Signed)
PROGRESS NOTE  [name] PYP:[id number] DOB: [birth year] DOA: 08-Oct-2022 PCP: Deatra James, MD   LOS: 2 days   Brief Narrative / Interim history: 87 year old man who comes in from his SNF on 11/6 for altered mental status, worsening respiratory status.  He has a history of HTN, HLD, recent CVA in September 2023 with right-sided deficits, aphasia, dysarthria, history of SDH s/p evacuation 2016, CKD, recent hospitalization for acute gangrenous perforated appendicitis.  Following this most recent hospitalization he has had a progressive decline, struggled with ongoing aphasia/dysarthria, poor p.o. intake.  He was initially admitted to the ICU as he was required pressors, after goals of care discussions he was transitioned to comfort care  Subjective / 24h Interval events: Poorly responsive.  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 September 2023 Dysarthria Dysphagia Failure to thrive History of SDH 2016 Acute kidney injury on chronic kidney disease 3 a HTN HLD Hypothyroidism Leukocytosis Macrocytosis Hypoalbuminemia   Scheduled Meds: Continuous Infusions: PRN Meds:.acetaminophen **OR** acetaminophen, antiseptic oral rinse, bisacodyl, glycopyrrolate **OR** glycopyrrolate **OR** glycopyrrolate, haloperidol lactate, LORazepam, morphine injection, ondansetron **OR** ondansetron (ZOFRAN) IV, polyvinyl alcohol  Current Outpatient Medications  Medication Instructions   acetaminophen (TYLENOL) 650  mg, Oral, 2 times daily PRN   atorvastatin (LIPITOR) 80 mg, Oral, Daily   Calcium Carb-Cholecalciferol (CALCIUM + D3 PO) 1 tablet, Oral, Daily   cefTRIAXone (ROCEPHIN) IVPB 1 g, Intravenous,  Once   clopidogrel (PLAVIX) 75 mg, Oral, Daily   levothyroxine (SYNTHROID) 50 mcg, Oral, Daily before breakfast   LORazepam (ATIVAN) 0.5 mg, Oral, 2 times daily PRN   losartan (COZAAR) 25 mg, Oral, Daily   mirtazapine (REMERON) 7.5 mg, Oral, Daily at bedtime   Multiple Vitamins-Minerals (CENTRUM SILVER 50+MEN) TABS 1 tablet, Oral, Daily   Omega-3 Fatty Acids (FISH OIL PO) 1 capsule, Oral, Daily   polyethylene glycol (MIRALAX / GLYCOLAX) 17 g, Oral, Daily   Polyethylene Glycol 400 1 % SOLN 1 drop, Both Eyes, 2 times daily   senna-docusate (SENOKOT-S) 8.6-50 MG tablet 2 tablets, Oral, Daily at bedtime   TIOTROPIUM BROMIDE MONOHYDRATE IN 1 Dose, Inhalation, 3 times daily      DVT prophylaxis:    Lab Results  Component Value Date   PLT 127 (L) 09/18/2022      Code Status: DNR  Family Communication: Grandson at bedside  Status is: Inpatient  Remains inpatient appropriate because: Anticipate in-hospital death  Level of care: Med-Surg  Consultants:  PCCM Palliative  Objective: Vitals:   09/18/22 1600 09/18/22 1615 09/18/22 1630 09/18/22 1642  BP:    (!) 80/61  Pulse: (!) 102 (!) 102 (!) 102 (!) 101  Resp: (!) 23 (!) 35 (!) 21 20  Temp:      TempSrc:      SpO2: 93% 93% 92% 92%  Weight:        Intake/Output Summary (Last 24 hours) at 09/26/2022 0936 Last data filed at 09/18/2022 1200 Gross per 24 hour  Intake 146.25 ml  Output --  Net 146.25 ml   Wt Readings from Last 3 Encounters:  09/18/22 59.6  kg  07/20/22 73.3 kg  05/12/20 70.4 kg    Examination:  Constitutional: Unresponsive Respiratory: Tachypnea, gurgly breath sounds Cardiovascular: Regular rate and rhythm, tachycardic   Data Reviewed: I have independently reviewed following labs and imaging studies    CBC Recent Labs  Lab 10-06-22 1215 2022/10/06 1240 10/06/22 1241 09/18/22 0558  WBC 19.0*  --   --  22.3*  HGB 15.2 15.0 14.3 13.5  HCT 49.6 44.0 42.0 43.3  PLT 168  --   --  127*  MCV 104.2*  --   --  102.4*  MCH 31.9  --   --  31.9  MCHC 30.6  --   --  31.2  RDW 13.7  --   --  13.7  LYMPHSABS 2.8  --   --   --   MONOABS 1.3*  --   --   --   EOSABS 0.0  --   --   --   BASOSABS 0.0  --   --   --     Recent Labs  Lab 2022/10/06 1215 06-Oct-2022 1240 10-06-22 1420 Oct 06, 2022 1722 10-06-22 2104 09/18/22 0048 09/18/22 0558 09/18/22 0900  NA 166*   < >  --  162* 159* 161* 154* 156*  K 4.7   < >  --  4.3 4.3 4.2 3.7 3.8  CL 128*   < >  --  123* 124* 120* 122* 122*  CO2 23  --   --  23 18* 21* 24 24  GLUCOSE 214*   < >  --  188* 212* 149* 221* 255*  BUN 125*   < >  --  112* 111* 106* 97* 93*  CREATININE 4.53*   < >  --  3.83* 3.68* 3.33* 2.88* 2.62*  CALCIUM 9.2  --   --  8.6* 8.5* 8.8* 8.1* 8.2*  AST 46*  --   --   --   --   --  54*  --   ALT 54*  --   --   --   --   --  49*  --   ALKPHOS 88  --   --   --   --   --  77  --   BILITOT 0.8  --   --   --   --   --  0.6  --   ALBUMIN 2.2*  --   --   --   --   --  1.9*  --   MG  --   --   --   --   --   --  2.1  --   PROCALCITON  --   --   --  0.88  --   --  0.65  --   LATICACIDVEN 5.1*  --  5.7* 5.8* 5.7*  --  2.8*  --   INR 1.4*  --   --   --   --   --   --   --    < > = values in this interval not displayed.    ------------------------------------------------------------------------------------------------------------------ No results for input(s): "CHOL", "HDL", "LDLCALC", "TRIG", "CHOLHDL", "LDLDIRECT" in the last 72 hours.  Lab Results  Component Value Date   HGBA1C 6.6 (H) 07/27/2022   ------------------------------------------------------------------------------------------------------------------ No results for input(s): "TSH", "T4TOTAL", "T3FREE", "THYROIDAB" in the last 72 hours.  Invalid input(s):  "FREET3"  Cardiac Enzymes No results for input(s): "CKMB", "TROPONINI", "MYOGLOBIN" in the last 168 hours.  Invalid input(s): "CK" ------------------------------------------------------------------------------------------------------------------  Component Value Date/Time   BNP 195.5 (H) 07/20/2022 2038    CBG: Recent Labs  Lab 09/28/2022 1651 10/10/2022 2022 09/15/2022 2326 09/18/22 0323 09/18/22 0747  GLUCAP 171* 217* 139* 161* 214*    Recent Results (from the past 240 hour(s))  Blood Culture (routine x 2)     Status: None (Preliminary result)   Collection Time: 09/24/2022 12:05 PM   Specimen: BLOOD LEFT HAND  Result Value Ref Range Status   Specimen Description BLOOD LEFT HAND  Final   Special Requests   Final    BOTTLES DRAWN AEROBIC AND ANAEROBIC Blood Culture results may not be optimal due to an inadequate volume of blood received in culture bottles   Culture   Final    NO GROWTH < 24 HOURS Performed at [facility], [address] [place], [place] [address]    Report Status PENDING  Incomplete  Blood Culture (routine x 2)     Status: None (Preliminary result)   Collection Time: 09/26/2022 12:15 PM   Specimen: BLOOD RIGHT HAND  Result Value Ref Range Status   Specimen Description BLOOD RIGHT HAND  Final   Special Requests   Final    BOTTLES DRAWN AEROBIC AND ANAEROBIC Blood Culture results may not be optimal due to an inadequate volume of blood received in culture bottles   Culture   Final    NO GROWTH < 24 HOURS Performed at [facility], [address], [place], [place] [address]    Report Status PENDING  Incomplete  Resp Panel by RT-PCR (Flu A&B, Covid) Anterior Nasal Swab     Status: None   Collection Time: 10/05/2022  4:50 PM   Specimen: Anterior Nasal Swab  Result Value Ref Range Status   SARS Coronavirus 2 by RT PCR NEGATIVE NEGATIVE Final    Comment: (NOTE) SARS-CoV-2 target nucleic acids are NOT DETECTED.  The SARS-CoV-2 RNA is generally  detectable in upper respiratory specimens during the acute phase of infection. The lowest concentration of SARS-CoV-2 viral copies this assay can detect is 138 copies/mL. A negative result does not preclude SARS-Cov-2 infection and should not be used as the sole basis for treatment or other patient management decisions. A negative result may occur with  improper specimen collection/handling, submission of specimen other than nasopharyngeal swab, presence of viral mutation(s) within the areas targeted by this assay, and inadequate number of viral copies(<138 copies/mL). A negative result must be combined with clinical observations, patient history, and epidemiological information. The expected result is Negative.  Fact Sheet for Patients:  BloggerCourse.com  Fact Sheet for Healthcare Providers:  SeriousBroker.it  This test is no t yet approved or cleared by the [place] FDA and  has been authorized for detection and/or diagnosis of SARS-CoV-2 by FDA under an Emergency Use Authorization (EUA). This EUA will remain  in effect (meaning this test can be used) for the duration of the COVID-19 declaration under Section 564(b)(1) of the Act, 21 U.S.C.section 360bbb-3(b)(1), unless the authorization is terminated  or revoked sooner.       Influenza A by PCR NEGATIVE NEGATIVE Final   Influenza B by PCR NEGATIVE NEGATIVE Final    Comment: (NOTE) The Xpert Xpress SARS-CoV-2/FLU/RSV plus assay is intended as an aid in the diagnosis of influenza from Nasopharyngeal swab specimens and should not be used as a sole basis for treatment. Nasal washings and aspirates are unacceptable for Xpert Xpress SARS-CoV-2/FLU/RSV testing.  Fact Sheet for Patients: BloggerCourse.com  Fact Sheet  for Healthcare Providers: SeriousBroker.it  This test is not yet approved or cleared by the [place]  and has been authorized for detection and/or diagnosis of SARS-CoV-2 by FDA under an Emergency Use Authorization (EUA). This EUA will remain in effect (meaning this test can be used) for the duration of the COVID-19 declaration under Section 564(b)(1) of the Act, 21 U.S.C. section 360bbb-3(b)(1), unless the authorization is terminated or revoked.  Performed at [facility], [address], [place], [place] [address]   MRSA Next Gen by PCR, Nasal     Status: None   Collection Time: 09/28/2022 10:09 PM   Specimen: Nasal Mucosa; Nasal Swab  Result Value Ref Range Status   MRSA by PCR Next Gen NOT DETECTED NOT DETECTED Final    Comment: (NOTE) The GeneXpert MRSA Assay (FDA approved for NASAL specimens only), is one component of a comprehensive MRSA colonization surveillance program. It is not intended to diagnose MRSA infection nor to guide or monitor treatment for MRSA infections. Test performance is not FDA approved in patients less than 46 years old. Performed at [facility], [address], [place], [place] [address]      Radiology Studies: No results found.   Pamella Pert, MD, PhD [facility]  Between 7 am - 7 pm I am available, please contact me via Amion (for emergencies) or Securechat (non urgent messages)  Between 7 pm - 7 am I am not available, please contact night coverage MD/APP via Amion

## 2022-10-12 NOTE — Final Progress Note (Signed)
Patient passed at 1812. Confirm by this RN and Jori Moll, RN. Family at bedside during time of death. Pamella Pert, MD informed - see death summary note. E-link RN called [facility]. No other needs at this time.

## 2022-10-12 NOTE — Progress Notes (Signed)
Patient EX:[id number] [place]      DOB: Mar 30, 1935      SWH:[id number]      Palliative Medicine Team    Subjective: Bedside symptom check completed. Two family members at bedside at time of visit.   Physical exam: Patient resting in bed with eyes closed at time of visit. Open mouthed breathing even and non-labored, no excessive secretions noted. Patient without physical or non-verbal signs of pain or discomfort at this time. Patient unresponsive at this time, appears comfortable.   Assessment and plan: No needs or concerns identified at this time, currently comfortable with available medications ordered. Will continue to follow for any changes or advances.    Thank you for allowing the Palliative Medicine Team to assist in the care of this patient.     Shelda Jakes, MSN, RN4 Palliative Medicine Team Team Phone: [phone]  This phone is monitored 7a-7p, please reach out to attending physician outside of these hours for urgent needs.

## 2022-10-12 DEATH — deceased
